# Patient Record
Sex: Female | Born: 1960
Health system: Southern US, Community
[De-identification: ages and names within clinical notes are randomized; demographics above are authoritative.]

## PROBLEM LIST (undated history)

## (undated) DIAGNOSIS — K219 Gastro-esophageal reflux disease without esophagitis: Secondary | ICD-10-CM

## (undated) DIAGNOSIS — W3400XA Accidental discharge from unspecified firearms or gun, initial encounter: Secondary | ICD-10-CM

## (undated) DIAGNOSIS — M797 Fibromyalgia: Secondary | ICD-10-CM

## (undated) DIAGNOSIS — F329 Major depressive disorder, single episode, unspecified: Secondary | ICD-10-CM

## (undated) DIAGNOSIS — I4892 Unspecified atrial flutter: Secondary | ICD-10-CM

## (undated) DIAGNOSIS — K2270 Barrett's esophagus without dysplasia: Secondary | ICD-10-CM

## (undated) DIAGNOSIS — IMO0001 Reserved for inherently not codable concepts without codable children: Secondary | ICD-10-CM

## (undated) DIAGNOSIS — I471 Supraventricular tachycardia, unspecified: Secondary | ICD-10-CM

## (undated) DIAGNOSIS — F32A Depression, unspecified: Secondary | ICD-10-CM

## (undated) DIAGNOSIS — I10 Essential (primary) hypertension: Secondary | ICD-10-CM

## (undated) DIAGNOSIS — E119 Type 2 diabetes mellitus without complications: Secondary | ICD-10-CM

## (undated) DIAGNOSIS — I5042 Chronic combined systolic (congestive) and diastolic (congestive) heart failure: Secondary | ICD-10-CM

## (undated) DIAGNOSIS — I2510 Atherosclerotic heart disease of native coronary artery without angina pectoris: Secondary | ICD-10-CM

## (undated) HISTORY — PX: NECK SURGERY: SHX720

## (undated) HISTORY — PX: OTHER SURGICAL HISTORY: SHX169

## (undated) HISTORY — DX: Supraventricular tachycardia: I47.1

## (undated) HISTORY — DX: Supraventricular tachycardia, unspecified: I47.10

---

## 2000-06-07 ENCOUNTER — Ambulatory Visit (HOSPITAL_COMMUNITY): Admission: RE | Admit: 2000-06-07 | Discharge: 2000-06-07 | Payer: Self-pay | Admitting: Internal Medicine

## 2000-06-10 ENCOUNTER — Encounter: Payer: Self-pay | Admitting: Internal Medicine

## 2000-06-10 ENCOUNTER — Ambulatory Visit (HOSPITAL_COMMUNITY): Admission: RE | Admit: 2000-06-10 | Discharge: 2000-06-10 | Payer: Self-pay | Admitting: Internal Medicine

## 2003-03-08 ENCOUNTER — Ambulatory Visit (HOSPITAL_COMMUNITY): Admission: RE | Admit: 2003-03-08 | Discharge: 2003-03-08 | Payer: Self-pay | Admitting: Family Medicine

## 2007-05-07 ENCOUNTER — Ambulatory Visit (HOSPITAL_COMMUNITY): Admission: RE | Admit: 2007-05-07 | Discharge: 2007-05-07 | Payer: Self-pay | Admitting: Family Medicine

## 2009-08-20 ENCOUNTER — Emergency Department (HOSPITAL_COMMUNITY): Admission: EM | Admit: 2009-08-20 | Discharge: 2009-08-20 | Payer: Self-pay | Admitting: Emergency Medicine

## 2010-05-14 LAB — COMPREHENSIVE METABOLIC PANEL
AST: 54 U/L — ABNORMAL HIGH (ref 0–37)
Albumin: 3.9 g/dL (ref 3.5–5.2)
Chloride: 99 mEq/L (ref 96–112)
Creatinine, Ser: 0.49 mg/dL (ref 0.4–1.2)
GFR calc Af Amer: >60 mL/min (ref >60)
Potassium: 3.3 mEq/L — ABNORMAL LOW (ref 3.5–5.1)
Total Bilirubin: 0.4 mg/dL (ref 0.3–1.2)

## 2010-05-14 LAB — CBC
Platelets: 287 10*3/uL (ref 150–400)
RBC: 4.86 MIL/uL (ref 3.87–5.11)
WBC: 9.8 10*3/uL (ref 4.0–10.5)

## 2010-05-14 LAB — URINALYSIS, ROUTINE W REFLEX MICROSCOPIC
Bilirubin Urine: NEGATIVE
Glucose, UA: >1000 mg/dL — AB
Hgb urine dipstick: NEGATIVE
Leukocytes, UA: NEGATIVE
Nitrite: NEGATIVE
Protein, ur: NEGATIVE mg/dL
Specific Gravity, Urine: >1.03 — ABNORMAL HIGH (ref 1.005–1.030)
Urobilinogen, UA: 0.2 mg/dL (ref 0.0–1.0)
pH: 5.5 (ref 5.0–8.0)

## 2010-05-14 LAB — DIFFERENTIAL
Basophils Absolute: 0.1 10*3/uL (ref 0.0–0.1)
Eosinophils Relative: 1 % (ref 0–5)
Lymphocytes Relative: 36 % (ref 12–46)
Monocytes Absolute: 0.5 10*3/uL (ref 0.1–1.0)
Monocytes Relative: 5 % (ref 3–12)

## 2010-05-14 LAB — URINE MICROSCOPIC-ADD ON

## 2013-05-31 ENCOUNTER — Encounter (HOSPITAL_COMMUNITY): Payer: Self-pay | Admitting: Emergency Medicine

## 2013-05-31 ENCOUNTER — Observation Stay (HOSPITAL_COMMUNITY)
Admission: EM | Admit: 2013-05-31 | Discharge: 2013-06-04 | Disposition: A | Discharge status: Home / Self Care | Payer: Federal, State, Local not specified - PPO | Attending: Cardiology | Admitting: Cardiology

## 2013-05-31 ENCOUNTER — Emergency Department (HOSPITAL_COMMUNITY): Payer: Federal, State, Local not specified - PPO

## 2013-05-31 DIAGNOSIS — I5021 Acute systolic (congestive) heart failure: Secondary | ICD-10-CM

## 2013-05-31 DIAGNOSIS — E119 Type 2 diabetes mellitus without complications: Secondary | ICD-10-CM | POA: Diagnosis present

## 2013-05-31 DIAGNOSIS — I10 Essential (primary) hypertension: Secondary | ICD-10-CM | POA: Insufficient documentation

## 2013-05-31 DIAGNOSIS — I5042 Chronic combined systolic (congestive) and diastolic (congestive) heart failure: Secondary | ICD-10-CM | POA: Diagnosis present

## 2013-05-31 DIAGNOSIS — I509 Heart failure, unspecified: Secondary | ICD-10-CM | POA: Insufficient documentation

## 2013-05-31 DIAGNOSIS — R0609 Other forms of dyspnea: Secondary | ICD-10-CM

## 2013-05-31 DIAGNOSIS — K2270 Barrett's esophagus without dysplasia: Secondary | ICD-10-CM | POA: Insufficient documentation

## 2013-05-31 DIAGNOSIS — IMO0002 Reserved for concepts with insufficient information to code with codable children: Secondary | ICD-10-CM

## 2013-05-31 DIAGNOSIS — Z7982 Long term (current) use of aspirin: Secondary | ICD-10-CM | POA: Insufficient documentation

## 2013-05-31 DIAGNOSIS — I059 Rheumatic mitral valve disease, unspecified: Secondary | ICD-10-CM

## 2013-05-31 DIAGNOSIS — K219 Gastro-esophageal reflux disease without esophagitis: Secondary | ICD-10-CM | POA: Insufficient documentation

## 2013-05-31 DIAGNOSIS — E0781 Sick-euthyroid syndrome: Secondary | ICD-10-CM | POA: Insufficient documentation

## 2013-05-31 DIAGNOSIS — R0600 Dyspnea, unspecified: Secondary | ICD-10-CM | POA: Insufficient documentation

## 2013-05-31 DIAGNOSIS — F3289 Other specified depressive episodes: Secondary | ICD-10-CM | POA: Insufficient documentation

## 2013-05-31 DIAGNOSIS — Z23 Encounter for immunization: Secondary | ICD-10-CM | POA: Insufficient documentation

## 2013-05-31 DIAGNOSIS — J811 Chronic pulmonary edema: Secondary | ICD-10-CM | POA: Insufficient documentation

## 2013-05-31 DIAGNOSIS — R000 Tachycardia, unspecified: Secondary | ICD-10-CM | POA: Insufficient documentation

## 2013-05-31 DIAGNOSIS — M797 Fibromyalgia: Secondary | ICD-10-CM

## 2013-05-31 DIAGNOSIS — E785 Hyperlipidemia, unspecified: Secondary | ICD-10-CM | POA: Insufficient documentation

## 2013-05-31 DIAGNOSIS — I2510 Atherosclerotic heart disease of native coronary artery without angina pectoris: Secondary | ICD-10-CM | POA: Insufficient documentation

## 2013-05-31 DIAGNOSIS — I5043 Acute on chronic combined systolic (congestive) and diastolic (congestive) heart failure: Principal | ICD-10-CM | POA: Insufficient documentation

## 2013-05-31 DIAGNOSIS — E039 Hypothyroidism, unspecified: Secondary | ICD-10-CM | POA: Insufficient documentation

## 2013-05-31 DIAGNOSIS — R0989 Other specified symptoms and signs involving the circulatory and respiratory systems: Secondary | ICD-10-CM

## 2013-05-31 DIAGNOSIS — R0789 Other chest pain: Secondary | ICD-10-CM | POA: Insufficient documentation

## 2013-05-31 DIAGNOSIS — E1165 Type 2 diabetes mellitus with hyperglycemia: Secondary | ICD-10-CM | POA: Diagnosis present

## 2013-05-31 DIAGNOSIS — IMO0001 Reserved for inherently not codable concepts without codable children: Secondary | ICD-10-CM | POA: Insufficient documentation

## 2013-05-31 DIAGNOSIS — Z6836 Body mass index (BMI) 36.0-36.9, adult: Secondary | ICD-10-CM | POA: Insufficient documentation

## 2013-05-31 DIAGNOSIS — F329 Major depressive disorder, single episode, unspecified: Secondary | ICD-10-CM | POA: Insufficient documentation

## 2013-05-31 HISTORY — DX: Major depressive disorder, single episode, unspecified: F32.9

## 2013-05-31 HISTORY — DX: Essential (primary) hypertension: I10

## 2013-05-31 HISTORY — DX: Gastro-esophageal reflux disease without esophagitis: K21.9

## 2013-05-31 HISTORY — DX: Reserved for inherently not codable concepts without codable children: IMO0001

## 2013-05-31 HISTORY — DX: Accidental discharge from unspecified firearms or gun, initial encounter: W34.00XA

## 2013-05-31 HISTORY — DX: Atherosclerotic heart disease of native coronary artery without angina pectoris: I25.10

## 2013-05-31 HISTORY — DX: Barrett's esophagus without dysplasia: K22.70

## 2013-05-31 HISTORY — DX: Fibromyalgia: M79.7

## 2013-05-31 HISTORY — DX: Type 2 diabetes mellitus without complications: E11.9

## 2013-05-31 HISTORY — DX: Chronic combined systolic (congestive) and diastolic (congestive) heart failure: I50.42

## 2013-05-31 HISTORY — DX: Depression, unspecified: F32.A

## 2013-05-31 LAB — CBC WITH DIFFERENTIAL/PLATELET
BASOS ABS: 0 10*3/uL (ref 0.0–0.1)
BASOS PCT: 0 % (ref 0–1)
Eosinophils Absolute: 0.1 10*3/uL (ref 0.0–0.7)
Eosinophils Relative: 1 % (ref 0–5)
HCT: 41.3 % (ref 36.0–46.0)
Hemoglobin: 14.1 g/dL (ref 12.0–15.0)
LYMPHS PCT: 44 % (ref 12–46)
Lymphs Abs: 4 10*3/uL (ref 0.7–4.0)
MCH: 28.7 pg (ref 26.0–34.0)
MCHC: 34.1 g/dL (ref 30.0–36.0)
MCV: 83.9 fL (ref 78.0–100.0)
Monocytes Absolute: 0.3 10*3/uL (ref 0.1–1.0)
Monocytes Relative: 3 % (ref 3–12)
NEUTROS ABS: 4.6 10*3/uL (ref 1.7–7.7)
NEUTROS PCT: 51 % (ref 43–77)
Platelets: 298 10*3/uL (ref 150–400)
RBC: 4.92 MIL/uL (ref 3.87–5.11)
RDW: 12.7 % (ref 11.5–15.5)
WBC: 9 10*3/uL (ref 4.0–10.5)

## 2013-05-31 LAB — BLOOD GAS, ARTERIAL
Acid-base deficit: 1 mmol/L (ref 0.0–2.0)
BICARBONATE: 23.1 meq/L (ref 20.0–24.0)
Drawn by: 22223
O2 Content: 2 L/min
O2 SAT: 96.5 %
PATIENT TEMPERATURE: 37
PO2 ART: 87.5 mmHg (ref 80.0–100.0)
TCO2: 20.5 mmol/L (ref 0–100)
pCO2 arterial: 37.8 mmHg (ref 35.0–45.0)
pH, Arterial: 7.402 (ref 7.350–7.450)

## 2013-05-31 LAB — BASIC METABOLIC PANEL
BUN: 15 mg/dL (ref 6–23)
CHLORIDE: 94 meq/L — AB (ref 96–112)
CO2: 25 mEq/L (ref 19–32)
Calcium: 10.1 mg/dL (ref 8.4–10.5)
Creatinine, Ser: 0.47 mg/dL — ABNORMAL LOW (ref 0.50–1.10)
GFR calc non Af Amer: >90 mL/min (ref >90)
Glucose, Bld: 288 mg/dL — ABNORMAL HIGH (ref 70–99)
POTASSIUM: 4.1 meq/L (ref 3.7–5.3)
SODIUM: 133 meq/L — AB (ref 137–147)

## 2013-05-31 LAB — GLUCOSE, CAPILLARY
Glucose-Capillary: 236 mg/dL — ABNORMAL HIGH (ref 70–99)
Glucose-Capillary: 310 mg/dL — ABNORMAL HIGH (ref 70–99)
Glucose-Capillary: 231 mg/dL — ABNORMAL HIGH (ref 70–99)
Glucose-Capillary: 281 mg/dL — ABNORMAL HIGH (ref 70–99)

## 2013-05-31 LAB — TROPONIN I
Troponin I: <0.3 ng/mL (ref <0.30)
Troponin I: <0.3 ng/mL (ref <0.30)

## 2013-05-31 LAB — HEMOGLOBIN A1C
Hgb A1c MFr Bld: 11.2 % — ABNORMAL HIGH (ref <5.7)
Mean Plasma Glucose: 275 mg/dL — ABNORMAL HIGH (ref <117)

## 2013-05-31 LAB — RAPID URINE DRUG SCREEN, HOSP PERFORMED
AMPHETAMINES: NOT DETECTED
BARBITURATES: NOT DETECTED
Benzodiazepines: NOT DETECTED
COCAINE: NOT DETECTED
Opiates: NOT DETECTED
Tetrahydrocannabinol: NOT DETECTED

## 2013-05-31 LAB — CBG MONITORING, ED: Glucose-Capillary: 234 mg/dL — ABNORMAL HIGH (ref 70–99)

## 2013-05-31 LAB — D-DIMER, QUANTITATIVE (NOT AT ARMC): D DIMER QUANT: 1 ug{FEU}/mL — AB (ref 0.00–0.48)

## 2013-05-31 LAB — I-STAT TROPONIN, ED: TROPONIN I, POC: 0.01 ng/mL (ref 0.00–0.08)

## 2013-05-31 LAB — TSH: TSH: 8.36 u[IU]/mL — AB (ref 0.350–4.500)

## 2013-05-31 LAB — PRO B NATRIURETIC PEPTIDE: PRO B NATRI PEPTIDE: 445.4 pg/mL — AB (ref 0–125)

## 2013-05-31 MED ORDER — ONDANSETRON HCL 4 MG/2ML IJ SOLN: 4.0000 mg | Freq: Four times a day (QID) | INTRAMUSCULAR | Status: DC | PRN

## 2013-05-31 MED ORDER — CARVEDILOL 3.125 MG PO TABS
3.1250 mg | ORAL_TABLET | Freq: Two times a day (BID) | ORAL | Status: DC
  Administered 2013-05-31 – 2013-06-02 (×4): 3.125 mg via ORAL
  Filled 2013-05-31 (×4): qty 1

## 2013-05-31 MED ORDER — ENOXAPARIN SODIUM 40 MG/0.4ML ~~LOC~~ SOLN
40.0000 mg | SUBCUTANEOUS | Status: DC
  Administered 2013-05-31 – 2013-06-03 (×4): 40 mg via SUBCUTANEOUS
  Filled 2013-05-31 (×4): qty 0.4

## 2013-05-31 MED ORDER — QUINAPRIL HCL 10 MG PO TABS
10.0000 mg | ORAL_TABLET | Freq: Every day | ORAL | Status: DC
  Filled 2013-05-31: qty 1

## 2013-05-31 MED ORDER — INSULIN ASPART 100 UNIT/ML ~~LOC~~ SOLN
0.0000 [IU] | Freq: Three times a day (TID) | SUBCUTANEOUS | Status: DC
  Administered 2013-05-31: 7 [IU] via SUBCUTANEOUS
  Administered 2013-06-01: 11 [IU] via SUBCUTANEOUS
  Administered 2013-06-01 (×2): 7 [IU] via SUBCUTANEOUS
  Administered 2013-06-02: 11 [IU] via SUBCUTANEOUS
  Administered 2013-06-02: 4 [IU] via SUBCUTANEOUS
  Administered 2013-06-02: 11 [IU] via SUBCUTANEOUS
  Administered 2013-06-03: 7 [IU] via SUBCUTANEOUS

## 2013-05-31 MED ORDER — INSULIN ASPART 100 UNIT/ML ~~LOC~~ SOLN: 0.0000 [IU] | Freq: Every day | SUBCUTANEOUS | Status: DC

## 2013-05-31 MED ORDER — ASPIRIN EC 325 MG PO TBEC
325.0000 mg | DELAYED_RELEASE_TABLET | Freq: Every day | ORAL | Status: DC
  Administered 2013-05-31 – 2013-06-03 (×4): 325 mg via ORAL
  Filled 2013-05-31 (×4): qty 1

## 2013-05-31 MED ORDER — LISINOPRIL 10 MG PO TABS
10.0000 mg | ORAL_TABLET | Freq: Every day | ORAL | Status: DC
  Administered 2013-05-31 – 2013-06-03 (×4): 10 mg via ORAL
  Filled 2013-05-31 (×5): qty 1

## 2013-05-31 MED ORDER — BIOTENE DRY MOUTH MT LIQD
15.0000 mL | Freq: Two times a day (BID) | OROMUCOSAL | Status: DC
  Administered 2013-05-31 – 2013-06-04 (×9): 15 mL via OROMUCOSAL

## 2013-05-31 MED ORDER — ALPRAZOLAM 0.5 MG PO TABS
0.5000 mg | ORAL_TABLET | Freq: Every evening | ORAL | Status: DC | PRN
  Administered 2013-06-01 – 2013-06-03 (×3): 0.5 mg via ORAL
  Filled 2013-05-31 (×3): qty 1

## 2013-05-31 MED ORDER — PANTOPRAZOLE SODIUM 40 MG PO TBEC
80.0000 mg | DELAYED_RELEASE_TABLET | Freq: Every day | ORAL | Status: DC
  Administered 2013-05-31: 80 mg via ORAL
  Filled 2013-05-31: qty 2

## 2013-05-31 MED ORDER — TRAMADOL HCL 50 MG PO TABS
50.0000 mg | ORAL_TABLET | Freq: Four times a day (QID) | ORAL | Status: DC | PRN
  Administered 2013-05-31 – 2013-06-04 (×10): 50 mg via ORAL
  Filled 2013-05-31 (×10): qty 1

## 2013-05-31 MED ORDER — ONDANSETRON HCL 4 MG PO TABS: 4.0000 mg | ORAL_TABLET | Freq: Four times a day (QID) | ORAL | Status: DC | PRN

## 2013-05-31 MED ORDER — ACETAMINOPHEN 650 MG RE SUPP: 650.0000 mg | Freq: Four times a day (QID) | RECTAL | Status: DC | PRN

## 2013-05-31 MED ORDER — NAPROXEN 250 MG PO TABS: 500.0000 mg | ORAL_TABLET | Freq: Two times a day (BID) | ORAL | Status: DC

## 2013-05-31 MED ORDER — FUROSEMIDE 10 MG/ML IJ SOLN
40.0000 mg | Freq: Once | INTRAMUSCULAR | Status: AC
  Administered 2013-05-31: 40 mg via INTRAVENOUS
  Filled 2013-05-31: qty 4

## 2013-05-31 MED ORDER — ALBUTEROL SULFATE (2.5 MG/3ML) 0.083% IN NEBU
2.5000 mg | INHALATION_SOLUTION | Freq: Once | RESPIRATORY_TRACT | Status: AC
  Administered 2013-05-31: 2.5 mg via RESPIRATORY_TRACT
  Filled 2013-05-31: qty 3

## 2013-05-31 MED ORDER — PNEUMOCOCCAL VAC POLYVALENT 25 MCG/0.5ML IJ INJ
0.5000 mL | INJECTION | INTRAMUSCULAR | Status: AC
  Administered 2013-06-01: 0.5 mL via INTRAMUSCULAR
  Filled 2013-05-31: qty 0.5

## 2013-05-31 MED ORDER — SODIUM CHLORIDE 0.9 % IJ SOLN
3.0000 mL | Freq: Two times a day (BID) | INTRAMUSCULAR | Status: DC
  Administered 2013-06-01 – 2013-06-03 (×4): 3 mL via INTRAVENOUS

## 2013-05-31 MED ORDER — PANTOPRAZOLE SODIUM 40 MG PO TBEC
40.0000 mg | DELAYED_RELEASE_TABLET | Freq: Every day | ORAL | Status: DC
  Administered 2013-06-01 – 2013-06-03 (×3): 40 mg via ORAL
  Filled 2013-05-31 (×3): qty 1

## 2013-05-31 MED ORDER — INSULIN ASPART 100 UNIT/ML ~~LOC~~ SOLN
0.0000 [IU] | Freq: Three times a day (TID) | SUBCUTANEOUS | Status: DC
  Administered 2013-05-31: 3 [IU] via SUBCUTANEOUS
  Administered 2013-05-31: 7 [IU] via SUBCUTANEOUS

## 2013-05-31 MED ORDER — IOHEXOL 350 MG/ML SOLN
100.0000 mL | Freq: Once | INTRAVENOUS | Status: AC | PRN
  Administered 2013-05-31: 100 mL via INTRAVENOUS

## 2013-05-31 MED ORDER — PAROXETINE HCL 20 MG PO TABS
40.0000 mg | ORAL_TABLET | Freq: Every day | ORAL | Status: DC
  Administered 2013-05-31 – 2013-06-04 (×5): 40 mg via ORAL
  Filled 2013-05-31 (×7): qty 2

## 2013-05-31 MED ORDER — FUROSEMIDE 20 MG PO TABS
20.0000 mg | ORAL_TABLET | Freq: Every day | ORAL | Status: DC
  Administered 2013-06-01 – 2013-06-04 (×4): 20 mg via ORAL
  Filled 2013-05-31 (×4): qty 1

## 2013-05-31 MED ORDER — IPRATROPIUM-ALBUTEROL 0.5-2.5 (3) MG/3ML IN SOLN
3.0000 mL | Freq: Once | RESPIRATORY_TRACT | Status: AC
  Administered 2013-05-31: 3 mL via RESPIRATORY_TRACT
  Filled 2013-05-31: qty 3

## 2013-05-31 MED ORDER — SODIUM CHLORIDE 0.9 % IJ SOLN: 3.0000 mL | INTRAMUSCULAR | Status: DC | PRN

## 2013-05-31 MED ORDER — SODIUM CHLORIDE 0.9 % IV SOLN: 250.0000 mL | INTRAVENOUS | Status: DC | PRN

## 2013-05-31 MED ORDER — ACETAMINOPHEN 325 MG PO TABS
650.0000 mg | ORAL_TABLET | Freq: Four times a day (QID) | ORAL | Status: DC | PRN
  Administered 2013-06-01 (×2): 650 mg via ORAL
  Filled 2013-05-31 (×2): qty 2

## 2013-05-31 MED ORDER — DOCUSATE SODIUM 100 MG PO CAPS
100.0000 mg | ORAL_CAPSULE | Freq: Every day | ORAL | Status: DC
  Administered 2013-05-31 – 2013-06-04 (×5): 100 mg via ORAL
  Filled 2013-05-31 (×7): qty 1

## 2013-05-31 MED ORDER — NITROGLYCERIN 0.4 MG SL SUBL
0.4000 mg | SUBLINGUAL_TABLET | SUBLINGUAL | Status: DC | PRN
  Administered 2013-06-03 (×2): 0.4 mg via SUBLINGUAL
  Filled 2013-05-31 (×2): qty 1

## 2013-05-31 MED ORDER — SODIUM CHLORIDE 0.9 % IJ SOLN
3.0000 mL | Freq: Two times a day (BID) | INTRAMUSCULAR | Status: DC
  Administered 2013-05-31 – 2013-06-03 (×6): 3 mL via INTRAVENOUS

## 2013-05-31 MED ORDER — INSULIN ASPART 100 UNIT/ML ~~LOC~~ SOLN
0.0000 [IU] | Freq: Every day | SUBCUTANEOUS | Status: DC
  Administered 2013-05-31: 3 [IU] via SUBCUTANEOUS
  Administered 2013-06-01: 4 [IU] via SUBCUTANEOUS
  Administered 2013-06-02: 3 [IU] via SUBCUTANEOUS

## 2013-05-31 NOTE — ED Notes (Signed)
Pt reports SOB that started a few hours ago. Pt states a heaviness to her chest. Pt has been coughing up small amounts of phlegm.
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
## 2013-05-31 NOTE — Progress Notes (Signed)
PROGRESS NOTE  Vanessa Silva LMB:[id number] DOB: 1960/09/26 DOA: 05/31/2013 PCP: Colette Ribas, MD  Summary: 53 year old woman with history of diabetes and hypertension presented with complaints of palpitations, chest pressure and shortness of breath on exertion. Initial evaluation revealed sinus tachycardia and oxygen saturation in the low 90s on room air. Extensive investigation including ABG, CTA of the chest, troponin, and EKG were unremarkable.  Assessment/Plan: 1. Acute systolic congestive heart failure. Echocardiogram reveals diffuse hypokinesis, new diagnosis. No hypoxia or tachypnea. 2. Atypical chest pressure. Describes reflux symptoms. Troponins negative. History, chronicity, echocardiogram findings suggest nonischemic cardiomyopathy. Continue serial cardiac enzymes. 3. Pulmonary edema. BNP minimally elevated. 4. Diabetes mellitus. Poorly controlled. Stop metformin indefinitely. Resume glyburide on discharge. 5. Hypertension. Labile. 6. Fibromyalgia. Stop chronic Naprosyn (taking "a lot" lately).    Start beta blocker, Lasix. Continue ACE inhibitor. Check lipid panel.  Cardiology consult 4/6.  Followup TSH.  Code Status: full code DVT prophylaxis: Lovenox Family Communication: none present Disposition Plan: home when improved; likely 1-2 days  Brendia Sacks, MD  [facility]  Pager [phone] If 7PM-7AM, please contact night-coverage at [email or web address], password [facility] 05/31/2013, 12:52 PM  LOS: 0 days   Consultants:  Cardiology 4/5  Procedures:  2-D echocardiogram. Left ventricular ejection fraction 30-35%. Diffuse hypokinesis.  HPI/Subjective: Breathing better today. Still has some wheezing. Complains of acid reflux. No chest pain.  Objective: Filed Vitals:   05/31/13 0419 05/31/13 0500 05/31/13 0600 05/31/13 0700  BP: 140/74 154/77 151/91 177/111  Pulse: 103 100 102 107  Temp: 97.6 F (36.4 C)   97.8 F (36.6 C)  TempSrc: Oral   Oral  Resp:  20 17 14    Height:    5\' 8"  (1.727 m)  Weight:    111.721 kg (246 lb 4.8 oz)  SpO2: 95% 94% 96% 95%   No intake or output data in the 24 hours ending 05/31/13 1252   Filed Weights   05/31/13 0124 05/31/13 0700  Weight: 97.523 kg (215 lb) 111.721 kg (246 lb 4.8 oz)    Exam:   Afebrile, vital signs stable. No hypoxia. Normal respiratory rate.  Gen. Appears comfortable. Tearful. Somewhat anxious. Nontoxic.  Respiratory clear to auscultation bilaterally. No wheezes, rales or rhonchi. Normal respiratory effort.  Cardiovascular regular rate and rhythm. No murmur, rub or gallop. No lower extremity edema.  Telemetry sinus tachycardia.  Respiratory clear to auscultation bilaterally. No wheezes, rales or rhonchi. Normal respiratory effort.  Abdomen soft nontender, nondistended. Obese.  Data Reviewed:  EKG shows sinus tachycardia, no acute changes.  Blood sugars 200-300s.  Troponins negative thus far  Urine drug screen negative.  Scheduled Meds: . antiseptic oral rinse  15 mL Mouth Rinse BID  . aspirin  325 mg Oral Daily  . docusate sodium  100 mg Oral Daily  . enoxaparin (LOVENOX) injection  40 mg Subcutaneous Q24H  . insulin aspart  0-5 Units Subcutaneous QHS  . insulin aspart  0-9 Units Subcutaneous TID WC  . lisinopril  10 mg Oral QHS  . pantoprazole  80 mg Oral Q1200  . PARoxetine  40 mg Oral Daily  . [START ON 06/01/2013] pneumococcal 23 valent vaccine  0.5 mL Intramuscular Tomorrow-1000  . sodium chloride  3 mL Intravenous Q12H   Continuous Infusions:   Principal Problem:   Acute systolic CHF (congestive heart failure) Active Problems:   Pulmonary edema   DM (diabetes mellitus), type 2, uncontrolled   Tachycardia   HTN (hypertension)   Chest discomfort   Fibromyalgia  Time spent 20 minutes

## 2013-05-31 NOTE — ED Notes (Signed)
Patient reports still having some chest pain and pressure and shortness of breath. States does not want to take nitroglycerin for pain at this time.

## 2013-05-31 NOTE — ED Notes (Signed)
Dr. Rito Ehrlich in assessing patient at this time.
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
## 2013-05-31 NOTE — ED Notes (Signed)
Patient ambulated around nursing station. Patient heart rate up to 117 and pulse oximetry to 90% on room air. Patient reported feeling somewhat unsteady towards end of ambulation.

## 2013-06-01 ENCOUNTER — Encounter (HOSPITAL_COMMUNITY): Payer: Self-pay | Admitting: Oncology

## 2013-06-01 DIAGNOSIS — I509 Heart failure, unspecified: Secondary | ICD-10-CM

## 2013-06-01 DIAGNOSIS — I5021 Acute systolic (congestive) heart failure: Secondary | ICD-10-CM

## 2013-06-01 LAB — LIPID PANEL
Cholesterol: 207 mg/dL — ABNORMAL HIGH (ref 0–200)
HDL: 51 mg/dL (ref >39)
LDL Cholesterol: 138 mg/dL — ABNORMAL HIGH (ref 0–99)
TRIGLYCERIDES: 88 mg/dL (ref <150)
Total CHOL/HDL Ratio: 4.1 RATIO
VLDL: 18 mg/dL (ref 0–40)

## 2013-06-01 LAB — GLUCOSE, CAPILLARY
GLUCOSE-CAPILLARY: 246 mg/dL — AB (ref 70–99)
GLUCOSE-CAPILLARY: 334 mg/dL — AB (ref 70–99)
Glucose-Capillary: 260 mg/dL — ABNORMAL HIGH (ref 70–99)
Glucose-Capillary: 225 mg/dL — ABNORMAL HIGH (ref 70–99)

## 2013-06-01 LAB — BASIC METABOLIC PANEL
BUN: 11 mg/dL (ref 6–23)
CHLORIDE: 98 meq/L (ref 96–112)
CO2: 28 meq/L (ref 19–32)
Calcium: 9.7 mg/dL (ref 8.4–10.5)
Creatinine, Ser: 0.41 mg/dL — ABNORMAL LOW (ref 0.50–1.10)
GFR calc Af Amer: >90 mL/min (ref >90)
Glucose, Bld: 257 mg/dL — ABNORMAL HIGH (ref 70–99)
Potassium: 3.9 mEq/L (ref 3.7–5.3)
SODIUM: 140 meq/L (ref 137–147)

## 2013-06-01 MED ORDER — FUROSEMIDE 10 MG/ML IJ SOLN
20.0000 mg | Freq: Once | INTRAMUSCULAR | Status: AC
  Administered 2013-06-01: 20 mg via INTRAVENOUS
  Filled 2013-06-01: qty 2

## 2013-06-01 MED ORDER — LORATADINE 10 MG PO TABS
10.0000 mg | ORAL_TABLET | Freq: Every day | ORAL | Status: DC
  Administered 2013-06-01 – 2013-06-04 (×4): 10 mg via ORAL
  Filled 2013-06-01 (×4): qty 1

## 2013-06-01 MED ORDER — INSULIN GLARGINE 100 UNIT/ML ~~LOC~~ SOLN
10.0000 [IU] | Freq: Every day | SUBCUTANEOUS | Status: DC
  Administered 2013-06-01: 10 [IU] via SUBCUTANEOUS
  Filled 2013-06-01: qty 0.1

## 2013-06-01 NOTE — Progress Notes (Signed)
I have given pt education materials on both diabetes and heart failure. Pt is very motivated to learn. We have talked about heart failure and the importance of monitoring daily weights. Pt also has inquired about using sliding scale insulins to tightly control sugars at home. Pt's husband uses insulin at home so she has some familiarity with it. At dinner I used sliding scale with pt at bedside and she was able to correctly determine amount of units needed to cover her dinnertime CBG and she was also able to appropriately demonstrate pulling up insulin into syringe as well as administering it to herself. Will continue to monitor.

## 2013-06-01 NOTE — Progress Notes (Signed)
Inpatient Diabetes Program Recommendations  AACE/ADA: New Consensus Statement on Inpatient Glycemic Control (2013)  Target Ranges:  Prepandial:   less than 140 mg/dL      Peak postprandial:   less than 180 mg/dL (1-2 hours)      Critically ill patients:  140 - 180 mg/dL   Results for Vanessa Silva, Vanessa Silva (MRN [id number]) as of 06/01/2013 07:38  Ref. Range 05/31/2013 08:20 05/31/2013 11:21 05/31/2013 16:08 05/31/2013 21:59 06/01/2013 07:12  Glucose-Capillary Latest Range: 70-99 mg/dL 106 (H) 269 (H) 485 (H) 281 (H) 246 (H)   Diabetes history: DM2 Outpatient Diabetes medications: Lantus 35 units QHS, Glyburide 5 mg BID, Metformin 1000 mg BID Current orders for Inpatient glycemic control: Novolog 0-20 units AC, Novolog 0-5 units HS  Inpatient Diabetes Program Recommendations Insulin - Basal: Please consider ordering Lantus 17 units daily (to start now).  HgbA1C: A1C 11.2% on 05/31/13.  Thanks, Orlando Penner, RN, MSN, CCRN Diabetes Coordinator Inpatient Diabetes Program [phone] (Team Pager) [phone] ([facility] office) [phone] [facility] office)

## 2013-06-01 NOTE — Care Management Note (Addendum)
    Page 1 of 1   06/03/2013     11:49:09 AM   CARE MANAGEMENT NOTE 06/03/2013  Patient:  ARSENIA, STOLZENBURG   Account Number:  [id number]  Date Initiated:  06/01/2013  Documentation initiated by:  Sharrie Rothman  Subjective/Objective Assessment:   Pt admitted from home with CHF. Pt lives with her husband and will return home at discharge. Pt is independent with ADl's. Pt helps care for her mother at night.     Action/Plan:   No CM needs noted.   Anticipated DC Date:  06/02/2013   Anticipated DC Plan:  HOME/SELF CARE      DC Planning Services  CM consult      Choice offered to / List presented to:             Status of service:  Completed, signed off Medicare Important Message given?   (If response is "NO", the following Medicare IM given date fields will be blank) Date Medicare IM given:   Date Additional Medicare IM given:    Discharge Disposition:  ACUTE TO ACUTE TRANS  Per UR Regulation:    If discussed at Long Length of Stay Meetings, dates discussed:    Comments:  06/03/13 1150 Arlyss Queen, RN BSN CM Pt to transfer to [facility] for heart cath. CM on receiving unit to follow for discharge planning needs.  06/01/13 1125 Arlyss Queen, RN BSN CM

## 2013-06-01 NOTE — Progress Notes (Addendum)
Explained to pt the need for educational videos on diabetes as her A1C is elevated. Pt is agreeable and has begun watching the videos. So far she has seen 501, 502, 503, 504

## 2013-06-01 NOTE — Progress Notes (Signed)
Nutrition Brief Note  Received consult for DM diet edu. Attempted to complete, however, pt was unavailable at time of visit. Will reattempt tomorrow.  Dennis Hegeman A. Mayford Knife, RD, LDN Pager: [phone]
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
## 2013-06-01 NOTE — Progress Notes (Signed)
PROGRESS NOTE  Vanessa Silva:[id number] DOB: Sep 18, 1960 DOA: 05/31/2013 PCP: Colette Ribas, MD  Summary: 53 year old woman with history of diabetes and hypertension presented with complaints of palpitations, chest pressure and shortness of breath on exertion. 2-D echocardiogram revealed new diagnosis of significant systolic congestive heart failure with diffuse hypokinesis. Cardiology following, ischemic evaluation being considered.  Assessment/Plan: 1. Acute systolic congestive heart failure. Clinically improved. Echocardiogram reveals diffuse hypokinesis, new diagnosis. Consider nonischemic. 2. Atypical chest pressure. Resolved. Describes reflux symptoms. Troponins negative.  3. Diabetes mellitus type II, uncontrolled. Hemoglobin A1c 11.2. Stop metformin indefinitely. Consider insulin on discharge. 4. High TSH. Follow T3, T4. 5. Hypertension. Well controlled. 6. Fibromyalgia. Stop chronic Naprosyn (taking "a lot" lately).    Overall improvement. Continue Coreg, Lasix, lisinopril.  Cardiology consult appreciated. Ischemic evaluation being considered.  Start Lantus  Followup T3, T4.  Code Status: full code DVT prophylaxis: Lovenox Family Communication: none present Disposition Plan: home when improved; likely 1-2 days  Brendia Sacks, MD  [facility]  Pager [phone] If 7PM-7AM, please contact night-coverage at [email or web address], password [facility] 06/01/2013, 5:58 PM  LOS: 1 day   Consultants:  Cardiology 4/5  Procedures:  2-D echocardiogram. Left ventricular ejection fraction 30-35%. Diffuse hypokinesis.  HPI/Subjective: Feels much better today. Chest tightness has resolved. Breathing better.  Objective: Filed Vitals:   05/31/13 2200 06/01/13 0447 06/01/13 1555 06/01/13 1751  BP: 156/85 120/75 129/78   Pulse: 97 88 88 90  Temp: 98 F (36.7 C) 97.6 F (36.4 C) 98.1 F (36.7 C)   TempSrc: Oral Oral Oral   Resp: 18 18 18    Height:      Weight:        SpO2: 95% 98% 96%     Intake/Output Summary (Last 24 hours) at 06/01/13 1758 Last data filed at 06/01/13 1200  Gross per 24 hour  Intake    480 ml  Output    800 ml  Net   -320 ml     Filed Weights   05/31/13 0124 05/31/13 0700  Weight: 97.523 kg (215 lb) 111.721 kg (246 lb 4.8 oz)    Exam:   Afebrile, vital signs stable. No hypoxia. Normal respiratory rate.  Gen. Appears calm and comfortable.  Respiratory clear to auscultation bilaterally. No wheezes, rales rhonchi. Normal respiratory effort.  Cardiovascular regular rate and rhythm. No murmur, rub or gallop. No lower extremity edema.  Data Reviewed:  Troponins negative.  Capillary blood sugars 200s.  LDL 138  Scheduled Meds: . antiseptic oral rinse  15 mL Mouth Rinse BID  . aspirin  325 mg Oral Daily  . carvedilol  3.125 mg Oral BID WC  . docusate sodium  100 mg Oral Daily  . enoxaparin (LOVENOX) injection  40 mg Subcutaneous Q24H  . furosemide  20 mg Oral Daily  . insulin aspart  0-20 Units Subcutaneous TID WC  . insulin aspart  0-5 Units Subcutaneous QHS  . lisinopril  10 mg Oral QHS  . loratadine  10 mg Oral Daily  . pantoprazole  40 mg Oral Q1200  . PARoxetine  40 mg Oral Daily  . sodium chloride  3 mL Intravenous Q12H  . sodium chloride  3 mL Intravenous Q12H   Continuous Infusions:   Principal Problem:   Acute systolic CHF (congestive heart failure) Active Problems:   Pulmonary edema   DM (diabetes mellitus), type 2, uncontrolled   Tachycardia   HTN (hypertension)   Chest discomfort   Fibromyalgia   Time spent 20  minutes

## 2013-06-02 DIAGNOSIS — E1165 Type 2 diabetes mellitus with hyperglycemia: Secondary | ICD-10-CM

## 2013-06-02 DIAGNOSIS — I10 Essential (primary) hypertension: Secondary | ICD-10-CM

## 2013-06-02 DIAGNOSIS — E0781 Sick-euthyroid syndrome: Secondary | ICD-10-CM

## 2013-06-02 DIAGNOSIS — IMO0001 Reserved for inherently not codable concepts without codable children: Secondary | ICD-10-CM

## 2013-06-02 LAB — GLUCOSE, CAPILLARY
GLUCOSE-CAPILLARY: 273 mg/dL — AB (ref 70–99)
Glucose-Capillary: 293 mg/dL — ABNORMAL HIGH (ref 70–99)
Glucose-Capillary: 154 mg/dL — ABNORMAL HIGH (ref 70–99)
Glucose-Capillary: 274 mg/dL — ABNORMAL HIGH (ref 70–99)

## 2013-06-02 LAB — T3: T3 TOTAL: 112.6 ng/dL (ref 80.0–204.0)

## 2013-06-02 LAB — T4, FREE: Free T4: 0.98 ng/dL (ref 0.80–1.80)

## 2013-06-02 MED ORDER — CARVEDILOL 6.25 MG PO TABS
6.2500 mg | ORAL_TABLET | Freq: Two times a day (BID) | ORAL | Status: DC
  Administered 2013-06-02 – 2013-06-04 (×3): 6.25 mg via ORAL
  Filled 2013-06-02: qty 2
  Filled 2013-06-02 (×3): qty 1
  Filled 2013-06-02: qty 2

## 2013-06-02 MED ORDER — INSULIN NPH (HUMAN) (ISOPHANE) 100 UNIT/ML ~~LOC~~ SUSP
15.0000 [IU] | Freq: Once | SUBCUTANEOUS | Status: AC
  Administered 2013-06-02: 15 [IU] via SUBCUTANEOUS
  Filled 2013-06-02 (×2): qty 10

## 2013-06-02 MED ORDER — ATORVASTATIN CALCIUM 20 MG PO TABS
20.0000 mg | ORAL_TABLET | Freq: Every day | ORAL | Status: DC
  Administered 2013-06-02: 20 mg via ORAL
  Filled 2013-06-02 (×2): qty 1

## 2013-06-02 MED ORDER — LIVING BETTER WITH HEART FAILURE BOOK
Freq: Once | Status: AC
  Administered 2013-06-02: 1
  Filled 2013-06-02: qty 1

## 2013-06-02 MED ORDER — INSULIN ASPART 100 UNIT/ML ~~LOC~~ SOLN
4.0000 [IU] | Freq: Three times a day (TID) | SUBCUTANEOUS | Status: DC
  Administered 2013-06-02 – 2013-06-03 (×4): 4 [IU] via SUBCUTANEOUS

## 2013-06-02 MED ORDER — INSULIN GLARGINE 100 UNIT/ML ~~LOC~~ SOLN
25.0000 [IU] | Freq: Every day | SUBCUTANEOUS | Status: DC
  Administered 2013-06-02: 25 [IU] via SUBCUTANEOUS
  Filled 2013-06-02: qty 0.25

## 2013-06-02 MED ORDER — INSULIN GLARGINE 100 UNIT/ML ~~LOC~~ SOLN: 15.0000 [IU] | Freq: Every day | SUBCUTANEOUS | Status: DC

## 2013-06-02 NOTE — Progress Notes (Signed)
[facility] PROGRESS NOTE  Myer HaffDawn Y Printz ZOX:[id number] DOB: 11/14/1960 DOA: 05/31/2013 PCP: Colette RibasGOLDING, JOHN CABOT, MD  Assessment/Plan  1. Acute systolic congestive heart failure. Clinically improved. Echocardiogram reveals diffuse hypokinesis, new diagnosis.  -  Appreciate cardiology assistance -  Plan for outpatient ischemia work up -  Advised to stop NSAIDS -  On ACEI/BB/lasix -  Consider addition of spironolactone as outpatient -  Heart failure education 2. Atypical chest pressure. Resolved. Describes reflux symptoms. Troponins negative.  Start PPI Diabetes mellitus type II, uncontrolled. Hemoglobin A1c 11.2. CBG is markedly elevated.  -  Stop metformin indefinitely.  -  Increase lantus to 25 units and increase aspart with meals -  Continue high dose SSI  -  Continue diabetes videos 3. Sick euthyroid with moderately elevated TSH.  T3/T4 wnl -  Repeat TFTs in 4 weeks by PCP 4. Hypertension. Well controlled. 5. Fibromyalgia. Stop chronic Naprosyn (taking "a lot" lately).   Diet:  Diabetic diet Access:  PIV IVF:  off Proph:  lovenox  Code Status: full Family Communication: patient alone Disposition Plan: pending improvement in CBGs, ambulating   Consultants:  Diabetic educator  Cardiology  Nutrition  Procedures:  ECHO  Antibiotics:  none   HPI/Subjective:  Patient states that her breathing is much easier now. She feels that her belly has shrunk with diuresis. Denies chest pains.  Objective: Filed Vitals:   06/01/13 1751 06/01/13 2209 06/02/13 0526 06/02/13 0718  BP:  141/82 119/72   Pulse: 90 94 87   Temp:  98 F (36.7 C) 97.5 F (36.4 C)   TempSrc:  Oral Oral   Resp:  20 20   Height:      Weight:      SpO2:   91% 91%    Intake/Output Summary (Last 24 hours) at 06/02/13 1205 Last data filed at 06/02/13 1052  Gross per 24 hour  Intake    480 ml  Output      0 ml  Net    480 ml   Filed Weights   05/31/13 0124 05/31/13 0700  Weight:  97.523 kg (215 lb) 111.721 kg (246 lb 4.8 oz)    Exam:   General:  Obese Caucasian female, No acute distress  HEENT:  NCAT, MMM  Cardiovascular:  RRR, nl S1, S2 no mrg, 2+ pulses, warm extremities  Respiratory:  CTAB, no increased WOB  Abdomen:   NABS, soft, NT/ND  MSK:   Normal tone and bulk, trace LEE  Neuro:  Grossly intact  Data Reviewed: Basic Metabolic Panel:  Recent Labs Lab 05/31/13 0123 06/01/13 0503  NA 133* 140  K 4.1 3.9  CL 94* 98  CO2 25 28  GLUCOSE 288* 257*  BUN 15 11  CREATININE 0.47* 0.41*  CALCIUM 10.1 9.7   Liver Function Tests: No results found for this basename: AST, ALT, ALKPHOS, BILITOT, PROT, ALBUMIN,  in the last 168 hours No results found for this basename: LIPASE, AMYLASE,  in the last 168 hours No results found for this basename: AMMONIA,  in the last 168 hours CBC:  Recent Labs Lab 05/31/13 0123  WBC 9.0  NEUTROABS 4.6  HGB 14.1  HCT 41.3  MCV 83.9  PLT 298   Cardiac Enzymes:  Recent Labs Lab 05/31/13 0712 05/31/13 1313 05/31/13 1910  TROPONINI <0.30 <0.30 <0.30   BNP (last 3 results)  Recent Labs  05/31/13 0123  PROBNP 445.4*   CBG:  Recent Labs Lab 06/01/13 1149 06/01/13 1631 06/01/13 2219 06/02/13 0735  06/02/13 1118  GLUCAP 260* 225* 334* 273* 293*    No results found for this or any previous visit (from the past 240 hour(s)).   Studies: No results found.  Scheduled Meds: . antiseptic oral rinse  15 mL Mouth Rinse BID  . aspirin  325 mg Oral Daily  . atorvastatin  20 mg Oral q1800  . carvedilol  6.25 mg Oral BID WC  . docusate sodium  100 mg Oral Daily  . enoxaparin (LOVENOX) injection  40 mg Subcutaneous Q24H  . furosemide  20 mg Oral Daily  . insulin aspart  0-20 Units Subcutaneous TID WC  . insulin aspart  0-5 Units Subcutaneous QHS  . insulin aspart  4 Units Subcutaneous TID WC  . insulin glargine  25 Units Subcutaneous QHS  . insulin NPH Human  15 Units Subcutaneous Once  .  lisinopril  10 mg Oral QHS  . loratadine  10 mg Oral Daily  . pantoprazole  40 mg Oral Q1200  . PARoxetine  40 mg Oral Daily  . sodium chloride  3 mL Intravenous Q12H  . sodium chloride  3 mL Intravenous Q12H   Continuous Infusions:   Principal Problem:   Acute systolic CHF (congestive heart failure) Active Problems:   Pulmonary edema   DM (diabetes mellitus), type 2, uncontrolled   Tachycardia   HTN (hypertension)   Chest discomfort   Fibromyalgia    Time spent: 30 min    Euclid Cassetta, [facility]  [facility] Pager [phone]. If 7PM-7AM, please contact night-coverage at [email or web address], password [facility] 06/02/2013, 12:05 PM  LOS: 2 days

## 2013-06-02 NOTE — Progress Notes (Addendum)
Consulting cardiologist: Dr. Dina Rich   Subjective:    Breathing comfortably, no chest pain or palpitations.  Objective:   Temp:  [97.5 F (36.4 C)-98.1 F (36.7 C)] 97.5 F (36.4 C) (04/07 0526) Pulse Rate:  [87-94] 87 (04/07 0526) Resp:  [18-20] 20 (04/07 0526) BP: (119-141)/(72-82) 119/72 mmHg (04/07 0526) SpO2:  [91 %-96 %] 91 % (04/07 0718) Last BM Date: 06/01/13  Filed Weights   05/31/13 0124 05/31/13 0700  Weight: 215 lb (97.523 kg) 246 lb 4.8 oz (111.721 kg)    Intake/Output Summary (Last 24 hours) at 06/02/13 1118 Last data filed at 06/02/13 1052  Gross per 24 hour  Intake    720 ml  Output      0 ml  Net    720 ml    Telemetry: Sinus rhythm.  Exam:  General: No distress.  Lungs: Clear, nonlabored.  Cardiac: RRR, no gallop.  Abdomen: Protuberant.  Extremities: Trace edema.   Lab Results:  Basic Metabolic Panel:  Recent Labs Lab 05/31/13 0123 06/01/13 0503  NA 133* 140  K 4.1 3.9  CL 94* 98  CO2 25 28  GLUCOSE 288* 257*  BUN 15 11  CREATININE 0.47* 0.41*  CALCIUM 10.1 9.7    CBC:  Recent Labs Lab 05/31/13 0123  WBC 9.0  HGB 14.1  HCT 41.3  MCV 83.9  PLT 298    Cardiac Enzymes:  Recent Labs Lab 05/31/13 0712 05/31/13 1313 05/31/13 1910  TROPONINI <0.30 <0.30 <0.30    BNP:  Recent Labs  05/31/13 0123  PROBNP 445.4*    Echocardiogram (05/31/2013): Study Conclusions  - Left ventricle: The cavity size was mildly dilated. Wall thickness was increased in a pattern of moderate LVH. Systolic function was moderately to severely reduced. The estimated ejection fraction was 35%, in the range of 30% to 35%. Diffuse hypokinesis. Doppler parameters are consistent with restrictive physiology, indicative of decreased left ventricular diastolic compliance and/or increased left atrial pressure. - Mitral valve: Calcified annulus. Mild regurgitation. - Left atrium: The atrium was moderately  dilated.    Medications:   Scheduled Medications: . antiseptic oral rinse  15 mL Mouth Rinse BID  . aspirin  325 mg Oral Daily  . carvedilol  3.125 mg Oral BID WC  . docusate sodium  100 mg Oral Daily  . enoxaparin (LOVENOX) injection  40 mg Subcutaneous Q24H  . furosemide  20 mg Oral Daily  . insulin aspart  0-20 Units Subcutaneous TID WC  . insulin aspart  0-5 Units Subcutaneous QHS  . insulin aspart  4 Units Subcutaneous TID WC  . insulin glargine  25 Units Subcutaneous QHS  . insulin NPH Human  15 Units Subcutaneous Once  . lisinopril  10 mg Oral QHS  . loratadine  10 mg Oral Daily  . pantoprazole  40 mg Oral Q1200  . PARoxetine  40 mg Oral Daily  . sodium chloride  3 mL Intravenous Q12H  . sodium chloride  3 mL Intravenous Q12H      PRN Medications:  sodium chloride, acetaminophen, acetaminophen, ALPRAZolam, nitroGLYCERIN, ondansetron (ZOFRAN) IV, ondansetron, sodium chloride, traMADol   Assessment:   1. Acute combined heart failure, LVEF 30-35% with diffuse hypokinesis. No clear evidence of ACS by cardiac enzymes. Could be nonischemic cardiomyopathy, but does have hypertension, poorly controlled type 2 diabetes mellitus, and family history of premature CAD.  2. Hypothyroidism. TSH 8.3, free T4 and T3 are normal.   3. Hypertension.  4. Type 2 diabetes mellitus,  poorly controlled with hemoglobin A1c 11.2.  5. Total cholesterol 207, LDL 138.   Plan/Discussion:    Current regimen includes aspirin, Coreg, Lasix, and lisinopril. Volume status improved, will increase Coreg dose to 6.25 mg BID. Would also suggest statin with LDL 138 in the face of diabetes mellitus. Would ambulate, possibly discharge within the next 24 hours. She will need outpatient followup with Dr. Wyline MoodBranch to discuss ischemic testing which can likely be accomplished as an outpatient.   Jonelle SidleSamuel G. McDowell, M.D., F.A.C.C.

## 2013-06-02 NOTE — Plan of Care (Signed)
Problem: Food- and Nutrition-Related Knowledge Deficit (NB-1.1) Goal: Nutrition education Formal process to instruct or train a patient/client in a skill or to impart knowledge to help patients/clients voluntarily manage or modify food choices and eating behavior to maintain or improve health. Outcome: Adequate for Discharge  RD consulted for nutrition education regarding diabetes.     Lab Results  Component Value Date    HGBA1C 11.2* 05/31/2013    RD provided "Carbohydrate Counting for People with Diabetes" handout from the Academy of [employer] and Dietetics. Discussed different food groups and their effects on blood sugar, emphasizing carbohydrate-containing foods. Provided list of carbohydrates and recommended serving sizes of common foods.  Discussed importance of controlled and consistent carbohydrate intake throughout the day. Provided examples of ways to balance meals/snacks and encouraged intake of high-fiber, whole grain complex carbohydrates. Teach back method used.  Expect fair compliance.  Body mass index is 37.46 kg/(m^2). Pt meets criteria for obesity, class II based on current BMI.  Current diet order is carb modified/ heart healthy, patient is consuming approximately 75-100% of meals at this time. Labs and medications reviewed. No further nutrition interventions warranted at this time. RD contact information provided. If additional nutrition issues arise, please re-consult RD.  Vanessa Silva, RD, LDN Pager: [phone]

## 2013-06-02 NOTE — Progress Notes (Signed)
CHF and DM education done with patient and husband. CHF video 102, DM videos [phone] watched by patient and husband.

## 2013-06-02 NOTE — Progress Notes (Signed)
Spoke with patient about diabetes and home regimen for diabetes control.  Patient states that she has an endocrinologist at [place] (Dr. Hollace Hayward) but she is interested in seeking a new endocrinologist that is closer to [facility]. Informed patient about Dr. Fransico Him (Endocrinologist in [facility]) and provided her with Dr. Isidoro Donning contact information. Patient states that she takes Lantus 30-40 units QHS (most of the time just takes a dose without knowing what her blood sugar is), Metformin 1000 mg BID, and Glyburide 5 mg BID as an outpatient. Discussed A1C results (11.2% on 05/31/13) and explained what an A1C is, basic pathophysiology of DM Type 2, basic home care, importance of checking CBGs and maintaining good CBG control to prevent long-term and short-term complications. Patient states that her last A1C was in the 11% range but she is unsure of the exact number. Patient admits that she does not check her blood sugar like she should, most days she does not even check once a day. When she does check her blood glucose at home it is always "greater than 200 mg/dl".  Explained importance of checking blood sugar 3-4 times a day to improve diabetes control and to provide doctors with the needed information to make adjustments in diabetes medications.  Requested patient check blood glucose 4 times a day (before meals and at bedtime). Patient states that she knows she needs to do better and that she is going to try to check her blood sugar as requested. Discussed impact of nutrition, exercise, stress, and sickness on diabetes control.  Patient states that she does not really follow a diabetic diet. Discussed Carb Modified Diabetic diet and provided patient with information on free outpatient diabetes education class at [facility]. Patient states that she will plan to attend one of the classes. Patient states that she has already watched several videos on diabetes education and diet.  Discussed  Lantus and Novolog insulins and explained onset, peak, and duration of insulins. Patient states that the bedside nurses have shown her how to use the "sliding scale with Novolog".  Patient states that she feels comfortable with using the sliding scale.  Asked that she ask nurses if she can draw up insulin based on the Novolog correction scale being used as an inpatient.  RNs to provide ongoing basic DM education at bedside with this patient and engage patient to actively check blood glucose and administer insulin injections. Patient verbalized understanding of information discussed and states that she does not have any further questions related to diabetes at this time.  MD: Please consider placing referral to Dr. Fransico Him. Patient has requested a 90 day supply of insulin and testing supplies at time of discharge (patient states her copays are better if she orders a 90 day supply versus a 30 day supply). Informed patient I would let MD know but I was not sure if Hospitalist would be able to do that since our intention is to have the patient follow up with an endocrinologist who can hopefully provide her with 90 day prescriptions.   Thanks, Orlando Penner, RN, MSN, CCRN Diabetes Coordinator Inpatient Diabetes Program [phone] (Team Pager) [phone] ([place] office) [phone] [facility] office)

## 2013-06-03 ENCOUNTER — Encounter (HOSPITAL_COMMUNITY)
Admission: EM | Disposition: A | Discharge status: Home / Self Care | Payer: Self-pay | Source: Home / Self Care | Attending: Family Medicine

## 2013-06-03 DIAGNOSIS — I2510 Atherosclerotic heart disease of native coronary artery without angina pectoris: Secondary | ICD-10-CM

## 2013-06-03 DIAGNOSIS — I509 Heart failure, unspecified: Secondary | ICD-10-CM

## 2013-06-03 DIAGNOSIS — R0789 Other chest pain: Secondary | ICD-10-CM

## 2013-06-03 DIAGNOSIS — I5021 Acute systolic (congestive) heart failure: Secondary | ICD-10-CM | POA: Insufficient documentation

## 2013-06-03 HISTORY — PX: LEFT AND RIGHT HEART CATHETERIZATION WITH CORONARY ANGIOGRAM: SHX5449

## 2013-06-03 LAB — POCT I-STAT 3, ART BLOOD GAS (G3+)
ACID-BASE DEFICIT: 1 mmol/L (ref 0.0–2.0)
Bicarbonate: 26.3 mEq/L — ABNORMAL HIGH (ref 20.0–24.0)
O2 Saturation: 93 %
TCO2: 28 mmol/L (ref 0–100)
pCO2 arterial: 53.9 mmHg — ABNORMAL HIGH (ref 35.0–45.0)
pH, Arterial: 7.296 — ABNORMAL LOW (ref 7.350–7.450)
pO2, Arterial: 75 mmHg — ABNORMAL LOW (ref 80.0–100.0)

## 2013-06-03 LAB — CBC
HCT: 42.1 % (ref 36.0–46.0)
Hemoglobin: 14.5 g/dL (ref 12.0–15.0)
MCH: 29 pg (ref 26.0–34.0)
MCHC: 34.4 g/dL (ref 30.0–36.0)
MCV: 84.2 fL (ref 78.0–100.0)
Platelets: 283 10*3/uL (ref 150–400)
RBC: 5 MIL/uL (ref 3.87–5.11)
RDW: 12.8 % (ref 11.5–15.5)
WBC: 9.4 10*3/uL (ref 4.0–10.5)

## 2013-06-03 LAB — BASIC METABOLIC PANEL
BUN: 11 mg/dL (ref 6–23)
CO2: 28 mEq/L (ref 19–32)
Calcium: 9.8 mg/dL (ref 8.4–10.5)
Chloride: 101 mEq/L (ref 96–112)
Creatinine, Ser: 0.39 mg/dL — ABNORMAL LOW (ref 0.50–1.10)
GFR calc Af Amer: >90 mL/min (ref >90)
Glucose, Bld: 246 mg/dL — ABNORMAL HIGH (ref 70–99)
POTASSIUM: 4.1 meq/L (ref 3.7–5.3)
SODIUM: 140 meq/L (ref 137–147)

## 2013-06-03 LAB — CREATININE, SERUM
Creatinine, Ser: 0.42 mg/dL — ABNORMAL LOW (ref 0.50–1.10)
GFR calc Af Amer: >90 mL/min (ref >90)

## 2013-06-03 LAB — POCT I-STAT 3, VENOUS BLOOD GAS (G3P V)
ACID-BASE DEFICIT: 3 mmol/L — AB (ref 0.0–2.0)
Bicarbonate: 24.5 mEq/L — ABNORMAL HIGH (ref 20.0–24.0)
O2 Saturation: 68 %
PH VEN: 7.295 (ref 7.250–7.300)
PO2 VEN: 40 mmHg (ref 30.0–45.0)
TCO2: 26 mmol/L (ref 0–100)
pCO2, Ven: 50.3 mmHg — ABNORMAL HIGH (ref 45.0–50.0)

## 2013-06-03 LAB — GLUCOSE, CAPILLARY
Glucose-Capillary: 249 mg/dL — ABNORMAL HIGH (ref 70–99)
Glucose-Capillary: 153 mg/dL — ABNORMAL HIGH (ref 70–99)

## 2013-06-03 LAB — POCT ACTIVATED CLOTTING TIME: ACTIVATED CLOTTING TIME: 155 s

## 2013-06-03 SURGERY — LEFT AND RIGHT HEART CATHETERIZATION WITH CORONARY ANGIOGRAM
Anesthesia: Moderate Sedation | Laterality: Bilateral

## 2013-06-03 MED ORDER — FENTANYL CITRATE 0.05 MG/ML IJ SOLN
INTRAMUSCULAR | Status: AC
  Filled 2013-06-03: qty 2

## 2013-06-03 MED ORDER — VERAPAMIL HCL 2.5 MG/ML IV SOLN
INTRAVENOUS | Status: AC
  Filled 2013-06-03: qty 2

## 2013-06-03 MED ORDER — SODIUM CHLORIDE 0.9 % IV SOLN: INTRAVENOUS | Status: AC

## 2013-06-03 MED ORDER — INSULIN GLARGINE 100 UNIT/ML ~~LOC~~ SOLN
40.0000 [IU] | Freq: Every day | SUBCUTANEOUS | Status: DC
  Administered 2013-06-03: 40 [IU] via SUBCUTANEOUS
  Filled 2013-06-03 (×3): qty 0.4

## 2013-06-03 MED ORDER — NITROGLYCERIN 0.2 MG/ML ON CALL CATH LAB
INTRAVENOUS | Status: AC
  Filled 2013-06-03: qty 1

## 2013-06-03 MED ORDER — INSULIN NPH (HUMAN) (ISOPHANE) 100 UNIT/ML ~~LOC~~ SUSP: 15.0000 [IU] | Freq: Once | SUBCUTANEOUS | Status: DC

## 2013-06-03 MED ORDER — ENOXAPARIN SODIUM 40 MG/0.4ML ~~LOC~~ SOLN
40.0000 mg | SUBCUTANEOUS | Status: DC
  Administered 2013-06-04: 40 mg via SUBCUTANEOUS
  Filled 2013-06-03 (×2): qty 0.4

## 2013-06-03 MED ORDER — MIDAZOLAM HCL 2 MG/2ML IJ SOLN
INTRAMUSCULAR | Status: AC
  Filled 2013-06-03: qty 2

## 2013-06-03 MED ORDER — INSULIN ASPART 100 UNIT/ML ~~LOC~~ SOLN
0.0000 [IU] | Freq: Three times a day (TID) | SUBCUTANEOUS | Status: DC
  Administered 2013-06-03: 5 [IU] via SUBCUTANEOUS
  Administered 2013-06-04: 3 [IU] via SUBCUTANEOUS
  Administered 2013-06-04: 2 [IU] via SUBCUTANEOUS

## 2013-06-03 MED ORDER — HEPARIN (PORCINE) IN NACL 2-0.9 UNIT/ML-% IJ SOLN
INTRAMUSCULAR | Status: AC
  Filled 2013-06-03: qty 1000

## 2013-06-03 MED ORDER — LIDOCAINE HCL (PF) 1 % IJ SOLN
INTRAMUSCULAR | Status: AC
  Filled 2013-06-03: qty 30

## 2013-06-03 MED ORDER — INSULIN ASPART 100 UNIT/ML ~~LOC~~ SOLN
10.0000 [IU] | Freq: Three times a day (TID) | SUBCUTANEOUS | Status: DC
  Administered 2013-06-04 (×2): 10 [IU] via SUBCUTANEOUS

## 2013-06-03 MED ORDER — HEPARIN (PORCINE) IN NACL 100-0.45 UNIT/ML-% IJ SOLN: 1000.0000 [IU]/h | INTRAMUSCULAR | Status: DC

## 2013-06-03 MED ORDER — ASPIRIN EC 81 MG PO TBEC
81.0000 mg | DELAYED_RELEASE_TABLET | Freq: Every day | ORAL | Status: DC
  Administered 2013-06-04: 81 mg via ORAL
  Filled 2013-06-03: qty 1

## 2013-06-03 MED ORDER — HEPARIN BOLUS VIA INFUSION
2000.0000 [IU] | Freq: Once | INTRAVENOUS | Status: DC
  Filled 2013-06-03: qty 2000

## 2013-06-03 NOTE — Interval H&P Note (Signed)
Cath Lab Visit (complete for each Cath Lab visit)  Clinical Evaluation Leading to the Procedure:   ACS: no  Non-ACS:    Anginal Classification: CCS IV  Anti-ischemic medical therapy: Minimal Therapy (1 class of medications)  Non-Invasive Test Results: No non-invasive testing performed  Prior CABG: No previous CABG      History and Physical Interval Note:  06/03/2013 3:38 PM Clinical data reviewed. Procedure and risk of stroke, death, MI, surgery, bleeding, kidney injury etc, discussed in detail. Vanessa Silva  has presented today for surgery, with the diagnosis of Heart Failure   The various methods of treatment have been discussed with the patient and family. After consideration of risks, benefits and other options for treatment, the patient has consented to  Procedure(s): LEFT AND RIGHT HEART CATHETERIZATION WITH CORONARY ANGIOGRAM (Bilateral) as a surgical intervention .  The patient's history has been reviewed, patient examined, no change in status, stable for surgery.  I have reviewed the patient's chart and labs.  Questions were answered to the patient's satisfaction.     Lyn Records III

## 2013-06-03 NOTE — Progress Notes (Signed)
Patient c/o chest pain. Feels heavy and pressure on mid chest and back. Vital signs stable. Dr. Malachi Bonds notified. Stat EKG ordered and respiratory notified.

## 2013-06-03 NOTE — Progress Notes (Signed)
Report called to Anastasia Fiedler at [facility].

## 2013-06-03 NOTE — Progress Notes (Signed)
Patient taken out by [facility] for transfer to [place].

## 2013-06-03 NOTE — H&P (View-Only) (Signed)
Patient ID: Vanessa Silva, female   DOB: 1960/11/20, 53 y.o.   MRN: [id number]     Primary cardiologist:  Subjective:    Chest pain this morning. Pressure in midchest with no associated symptoms that lasted 45 minutes. Now resolved.   Objective:   Temp:  [97.8 F (36.6 C)-98.3 F (36.8 C)] 97.8 F (36.6 C) (04/08 0501) Pulse Rate:  [87-89] 88 (04/08 0839) Resp:  [16-20] 16 (04/08 0839) BP: (118-126)/(75-83) 118/75 mmHg (04/08 0839) SpO2:  [94 %-99 %] 99 % (04/08 0839) Last BM Date: 06/02/13  Filed Weights   05/31/13 0124 05/31/13 0700  Weight: 215 lb (97.523 kg) 246 lb 4.8 oz (111.721 kg)    Intake/Output Summary (Last 24 hours) at 06/03/13 0855 Last data filed at 06/02/13 1830  Gross per 24 hour  Intake    720 ml  Output      0 ml  Net    720 ml    Telemetry: NSR  Exam:  General: NAD  Resp: CTAB  Cardiac: RRR, no m/r/g, no JVD, no carotid bruits  OI:LNZVJKQ soft, NT, ND  MSK:no LE edema  Neuro: no focal deficits   Lab Results:  Basic Metabolic Panel:  Recent Labs Lab 05/31/13 0123 06/01/13 0503 06/03/13 0452  NA 133* 140 140  K 4.1 3.9 4.1  CL 94* 98 101  CO2 25 28 28   GLUCOSE 288* 257* 246*  BUN 15 11 11   CREATININE 0.47* 0.41* 0.39*  CALCIUM 10.1 9.7 9.8    Liver Function Tests: No results found for this basename: AST, ALT, ALKPHOS, BILITOT, PROT, ALBUMIN,  in the last 168 hours  CBC:  Recent Labs Lab 05/31/13 0123  WBC 9.0  HGB 14.1  HCT 41.3  MCV 83.9  PLT 298    Cardiac Enzymes:  Recent Labs Lab 05/31/13 0712 05/31/13 1313 05/31/13 1910  TROPONINI <0.30 <0.30 <0.30    BNP:  Recent Labs  05/31/13 0123  PROBNP 445.4*    Coagulation: No results found for this basename: INR,  in the last 168 hours  ECG:   Medications:   Scheduled Medications: . antiseptic oral rinse  15 mL Mouth Rinse BID  . aspirin  325 mg Oral Daily  . atorvastatin  20 mg Oral q1800  . carvedilol  6.25 mg Oral BID WC  . docusate  sodium  100 mg Oral Daily  . enoxaparin (LOVENOX) injection  40 mg Subcutaneous Q24H  . furosemide  20 mg Oral Daily  . insulin aspart  0-20 Units Subcutaneous TID WC  . insulin aspart  0-5 Units Subcutaneous QHS  . insulin aspart  4 Units Subcutaneous TID WC  . insulin glargine  25 Units Subcutaneous QHS  . lisinopril  10 mg Oral QHS  . loratadine  10 mg Oral Daily  . pantoprazole  40 mg Oral Q1200  . PARoxetine  40 mg Oral Daily  . sodium chloride  3 mL Intravenous Q12H  . sodium chloride  3 mL Intravenous Q12H     Infusions:     PRN Medications:  sodium chloride, acetaminophen, acetaminophen, ALPRAZolam, nitroGLYCERIN, ondansetron (ZOFRAN) IV, ondansetron, sodium chloride, traMADol     Assessment/Plan    1. Acute systolic/diastolic heart failure  - echo with LVEF 30-35%, restrictive diastolic function. New diagnosis for patient.  - etiology of her heart failure is unclear. She has several CAD risk factors including DM, HTN and a father with an MI in his 54s. Her TSH is elevated with normal free T4  and T3. She also reports non-specific viral URI symptoms on and off for the last few months  - no evidence of acute ischemia - symptoms improved with diuresis, will continue current medication regimen. She is now on oral lasix  2. Chest pain - fairly mixed in the description of the pain, 2 episodes prior to admission and one episode this morning - EKG and cardiac enzymes have not shown evidence of ACS - she has several CAD risk factors including DM, HTN, and father with MI in his 1150s.  - given her history of DM this could be somewhat atypical angina, with her decreased LVEF she is in need of an ischemic evaluation. Initially considered as outpatient however given repeat episodes of this discomfort [facility].  - it true angina, her TIMI score is 2 consistent with lower risk. In setting of lower risk and mixed symptoms will hold off  anticoagulation at this time, consider starting if recurrent symptoms.   3. Hypothyroidism  - management per primary team, elevated TSH with normal free T4 and T3 - repeat studies in 4 weeks with primary physician  4. HTN  - follow bp as CHF meds initiated        Dina RichJonathan Deontaye Civello, M.D., F.A.C.C.

## 2013-06-03 NOTE — Progress Notes (Signed)
[facility] PROGRESS NOTE  Vanessa HaffDawn Y Silva ZOX:[id number] DOB: 02/19/1961 DOA: 05/31/2013 PCP: Colette RibasGOLDING, JOHN CABOT, MD  Assessment/Plan   Acute systolic congestive heart failure. Clinically improved. Echocardiogram reveals diffuse hypokinesis, new diagnosis.  Had another episode of chest pressure this morning, EKG demonstrates possibly worsened T wave inversions in the lateral leads. Nitroglycerin did not really help her pain.  -  Appreciate cardiology assistance -  transferred account for catheterization  -  Advised to stop NSAIDS -  On ACEI/BB/lasix -  Consider addition of spironolactone as outpatient -  Heart failure education  Atypical chest pressure. Resolved. Describes reflux symptoms. Troponins negative.  Start PPI -  [facility]   Diabetes mellitus type II, uncontrolled. Hemoglobin A1c 11.2. CBG still elevated.  Increase total daily insulin by 25% to about 80 units per day  -  Increase lantus to 40 units -  NPH 15 units once to cover until next dose of lantus -  Aspart 10 units with meals -  Decrease to low dose SSI -  Continue HS insulin -  Completed diabetes vidoes  Sick euthyroid with moderately elevated TSH.  T3/T4 wnl -  Repeat TFTs in 4 weeks by PCP  Hypertension. Well controlled  Fibromyalgia. Stop chronic Naprosyn (taking "a lot" lately) or other NSAIDS due to heart failure.    Diet:  Diabetic diet Access:  PIV IVF:  off Proph:  lovenox  Code Status: full Family Communication: patient alone Disposition Plan:  [facility]   Consultants:  Diabetic educator  Cardiology  Nutrition  Procedures:  ECHO  Antibiotics:  none   HPI/Subjective:  Patient c/o substernal chest pressure with radiation to back this AM with associated SOB.  Denies N/V, diaphoresis.  Occurred when walking to bathroom.    Objective: Filed Vitals:   06/02/13 1355 06/02/13 2154 06/03/13 0501 06/03/13 0839  BP: 126/83  122/80 123/79 118/75  Pulse: 89 88 87 88  Temp: 98.3 F (36.8 C) 98.1 F (36.7 C) 97.8 F (36.6 C)   TempSrc: Oral Oral Oral   Resp: 20 20 20 16   Height:      Weight:      SpO2: 97% 94% 94% 99%    Intake/Output Summary (Last 24 hours) at 06/03/13 1002 Last data filed at 06/02/13 1830  Gross per 24 hour  Intake    720 ml  Output      0 ml  Net    720 ml   Filed Weights   05/31/13 0124 05/31/13 0700  Weight: 97.523 kg (215 lb) 111.721 kg (246 lb 4.8 oz)    Exam:   General:  Obese Caucasian female, No acute distress  HEENT:  NCAT, MMM  Cardiovascular:  RRR, nl S1, S2 no mrg, 2+ pulses, warm extremities  Respiratory:  CTAB, no increased WOB  Abdomen:   NABS, soft, NT/ND  MSK:   Normal tone and bulk, trace LEE  Neuro:  Grossly intact  Data Reviewed: Basic Metabolic Panel:  Recent Labs Lab 05/31/13 0123 06/01/13 0503 06/03/13 0452  NA 133* 140 140  K 4.1 3.9 4.1  CL 94* 98 101  CO2 25 28 28   GLUCOSE 288* 257* 246*  BUN 15 11 11   CREATININE 0.47* 0.41* 0.39*  CALCIUM 10.1 9.7 9.8   Liver Function Tests: No results found for this basename: AST, ALT, ALKPHOS, BILITOT, PROT, ALBUMIN,  in the last 168 hours No results found for this basename: LIPASE, AMYLASE,  in the last 168  hours No results found for this basename: AMMONIA,  in the last 168 hours CBC:  Recent Labs Lab 05/31/13 0123  WBC 9.0  NEUTROABS 4.6  HGB 14.1  HCT 41.3  MCV 83.9  PLT 298   Cardiac Enzymes:  Recent Labs Lab 05/31/13 0712 05/31/13 1313 05/31/13 1910  TROPONINI <0.30 <0.30 <0.30   BNP (last 3 results)  Recent Labs  05/31/13 0123  PROBNP 445.4*   CBG:  Recent Labs Lab 06/02/13 0735 06/02/13 1118 06/02/13 1612 06/02/13 2152 06/03/13 0740  GLUCAP 273* 293* 154* 274* 249*    No results found for this or any previous visit (from the past 240 hour(s)).   Studies: No results found.  Scheduled Meds: . antiseptic oral rinse  15 mL Mouth Rinse BID  . [START  ON 06/04/2013] aspirin  81 mg Oral Daily  . atorvastatin  20 mg Oral q1800  . carvedilol  6.25 mg Oral BID WC  . docusate sodium  100 mg Oral Daily  . enoxaparin (LOVENOX) injection  40 mg Subcutaneous Q24H  . furosemide  20 mg Oral Daily  . insulin aspart  0-20 Units Subcutaneous TID WC  . insulin aspart  0-5 Units Subcutaneous QHS  . insulin aspart  4 Units Subcutaneous TID WC  . insulin glargine  25 Units Subcutaneous QHS  . lisinopril  10 mg Oral QHS  . loratadine  10 mg Oral Daily  . pantoprazole  40 mg Oral Q1200  . PARoxetine  40 mg Oral Daily  . sodium chloride  3 mL Intravenous Q12H  . sodium chloride  3 mL Intravenous Q12H   Continuous Infusions:   Principal Problem:   Acute systolic CHF (congestive heart failure) Active Problems:   Pulmonary edema   DM (diabetes mellitus), type 2, uncontrolled   Tachycardia   HTN (hypertension)   Chest discomfort   Fibromyalgia   Sick-euthyroid syndrome    Time spent: 30 min    Renae Fickle  [facility] Pager [phone]. If 7PM-7AM, please contact night-coverage at [email or web address], password [facility] 06/03/2013, 10:02 AM  LOS: 3 days

## 2013-06-03 NOTE — Progress Notes (Signed)
Inpatient Diabetes Program Recommendations  AACE/ADA: New Consensus Statement on Inpatient Glycemic Control (2013)  Target Ranges:  Prepandial:   less than 140 mg/dL      Peak postprandial:   less than 180 mg/dL (1-2 hours)      Critically ill patients:  140 - 180 mg/dL   Results for Vanessa Silva, Vanessa Silva (MRN [id number]) as of 06/03/2013 08:29  Ref. Range 06/02/2013 07:35 06/02/2013 11:18 06/02/2013 16:12 06/02/2013 21:52 06/03/2013 07:40  Glucose-Capillary Latest Range: 70-99 mg/dL 702 (H) 637 (H) 858 (H) 274 (H) 249 (H)   Diabetes history: DM2 Outpatient Diabetes medications: Lantus 35 units QHS, Glyburide 5 mg BID, Metformin 1000 mg BID Current orders for Inpatient glycemic control: Lantus 25 units QHS, Novolog 0-20 units AC, Novolog 0-5 units HS  Inpatient Diabetes Program Recommendations Insulin - Basal: Please increase Lantus to 33 units QHS (based on 111 kg x 0.3 units). Insulin - Meal Coverage: Please consider ordering Novolog 5 units TID with meals for meal coverage. HgbA1C: A1C 11.2% on 05/31/13.  Thanks, Orlando Penner, RN, MSN, CCRN Diabetes Coordinator Inpatient Diabetes Program [phone] (Team Pager) [phone] ([place] office) [phone] [facility] office)

## 2013-06-03 NOTE — Progress Notes (Signed)
Patient ID: SHERRION CLUFF, female   DOB: 1960/11/20, 53 y.o.   MRN: [id number]     Primary cardiologist:  Subjective:    Chest pain this morning. Pressure in midchest with no associated symptoms that lasted 45 minutes. Now resolved.   Objective:   Temp:  [97.8 F (36.6 C)-98.3 F (36.8 C)] 97.8 F (36.6 C) (04/08 0501) Pulse Rate:  [87-89] 88 (04/08 0839) Resp:  [16-20] 16 (04/08 0839) BP: (118-126)/(75-83) 118/75 mmHg (04/08 0839) SpO2:  [94 %-99 %] 99 % (04/08 0839) Last BM Date: 06/02/13  Filed Weights   05/31/13 0124 05/31/13 0700  Weight: 215 lb (97.523 kg) 246 lb 4.8 oz (111.721 kg)    Intake/Output Summary (Last 24 hours) at 06/03/13 0855 Last data filed at 06/02/13 1830  Gross per 24 hour  Intake    720 ml  Output      0 ml  Net    720 ml    Telemetry: NSR  Exam:  General: NAD  Resp: CTAB  Cardiac: RRR, no m/r/g, no JVD, no carotid bruits  OI:LNZVJKQ soft, NT, ND  MSK:no LE edema  Neuro: no focal deficits   Lab Results:  Basic Metabolic Panel:  Recent Labs Lab 05/31/13 0123 06/01/13 0503 06/03/13 0452  NA 133* 140 140  K 4.1 3.9 4.1  CL 94* 98 101  CO2 25 28 28   GLUCOSE 288* 257* 246*  BUN 15 11 11   CREATININE 0.47* 0.41* 0.39*  CALCIUM 10.1 9.7 9.8    Liver Function Tests: No results found for this basename: AST, ALT, ALKPHOS, BILITOT, PROT, ALBUMIN,  in the last 168 hours  CBC:  Recent Labs Lab 05/31/13 0123  WBC 9.0  HGB 14.1  HCT 41.3  MCV 83.9  PLT 298    Cardiac Enzymes:  Recent Labs Lab 05/31/13 0712 05/31/13 1313 05/31/13 1910  TROPONINI <0.30 <0.30 <0.30    BNP:  Recent Labs  05/31/13 0123  PROBNP 445.4*    Coagulation: No results found for this basename: INR,  in the last 168 hours  ECG:   Medications:   Scheduled Medications: . antiseptic oral rinse  15 mL Mouth Rinse BID  . aspirin  325 mg Oral Daily  . atorvastatin  20 mg Oral q1800  . carvedilol  6.25 mg Oral BID WC  . docusate  sodium  100 mg Oral Daily  . enoxaparin (LOVENOX) injection  40 mg Subcutaneous Q24H  . furosemide  20 mg Oral Daily  . insulin aspart  0-20 Units Subcutaneous TID WC  . insulin aspart  0-5 Units Subcutaneous QHS  . insulin aspart  4 Units Subcutaneous TID WC  . insulin glargine  25 Units Subcutaneous QHS  . lisinopril  10 mg Oral QHS  . loratadine  10 mg Oral Daily  . pantoprazole  40 mg Oral Q1200  . PARoxetine  40 mg Oral Daily  . sodium chloride  3 mL Intravenous Q12H  . sodium chloride  3 mL Intravenous Q12H     Infusions:     PRN Medications:  sodium chloride, acetaminophen, acetaminophen, ALPRAZolam, nitroGLYCERIN, ondansetron (ZOFRAN) IV, ondansetron, sodium chloride, traMADol     Assessment/Plan    1. Acute systolic/diastolic heart failure  - echo with LVEF 30-35%, restrictive diastolic function. New diagnosis for patient.  - etiology of her heart failure is unclear. She has several CAD risk factors including DM, HTN and a father with an MI in his 54s. Her TSH is elevated with normal free T4  and T3. She also reports non-specific viral URI symptoms on and off for the last few months  - no evidence of acute ischemia - symptoms improved with diuresis, will continue current medication regimen. She is now on oral lasix  2. Chest pain - fairly mixed in the description of the pain, 2 episodes prior to admission and one episode this morning - EKG and cardiac enzymes have not shown evidence of ACS - she has several CAD risk factors including DM, HTN, and father with MI in his 50s.  - given her history of DM this could be somewhat atypical angina, with her decreased LVEF she is in need of an ischemic evaluation. Initially considered as outpatient however given repeat episodes of this discomfort will arrange transfer to [place] with LHC/RHC.  - it true angina, her TIMI score is 2 consistent with lower risk. In setting of lower risk and mixed symptoms will hold off  anticoagulation at this time, consider starting if recurrent symptoms.   3. Hypothyroidism  - management per primary team, elevated TSH with normal free T4 and T3 - repeat studies in 4 weeks with primary physician  4. HTN  - follow bp as CHF meds initiated        Kaede Clendenen, M.D., F.A.C.C.  

## 2013-06-03 NOTE — CV Procedure (Signed)
     Left and Right Heart Catheterization with Coronary Angiography  Report  Vanessa Silva  53 y.o.  female 1960-10-16  Procedure Date: 06/03/2013 Referring Physician: Dina Rich, M.D. Primary Cardiologist: Dina Rich, M.D.  INDICATIONS: Acute heart failure with associated chest tightness in a patient with long-standing diabetes  PROCEDURE: 1. Left heart catheterization; 2. Right heart catheterization; 3. Coronary angiography; 4. Left ventriculography  CONSENT:  The risks, benefits, and details of the procedure were explained in detail to the patient. Risks including death, stroke, heart attack, kidney injury, allergy, limb ischemia, bleeding and radiation injury were discussed.  The patient verbalized understanding and wanted to proceed.  Informed written consent was obtained.  PROCEDURE TECHNIQUE:  After Xylocaine anesthesia a 5 French Slender sheath was placed in the right radial artery with an angiocath and the modified Seldinger technique. We  exchanged a right antecubital vein Angiocath over a micropuncture wire and placed a brachial 5 French sheath. We were unable to advance a 5 French balloon tipped catheter or an angioplasty guidewire beyond the arm. We turned our attention to the right femoral vein where we were able to place a 7 French sheath and perform right heart catheterization using a 7 French Swan-Ganz catheter. Coronary angiography was done using a 5 F JR 4 and JL 3.5 cm diagnostic catheters.  Left ventriculography was done using the JR 4 catheter and hand injection. A main pulmonary artery and a standing aortic oxygen saturation was obtained. A pullback pressure through the right heart was performed with a Swan-Ganz catheter.  Hemostasis was achieved in the right femoral vein with manual compression. A wrist band was placed on the right radial artery cath site with good hemostasis. The right antecubital vein sheath site achieved hemostasis by manual compression.   CONTRAST:  Total of 110 cc.  COMPLICATIONS:  None   HEMODYNAMICS:  Aortic pressure 116/75 mmHg; LV pressure 120/7 mmHg; LVEDP 19 mm mercury; RA 8 mm mercury; RV 32/8 mmHg; PA 32/20 mmHg; PCWP(mean) 14 mm mercury; Cardiac Output 5. 59 L per minute by thermal dilution and 6.21 L per minute by Fick; AV gradient 0.  ANGIOGRAPHIC DATA:   The left main coronary artery is widely patent.  The left anterior descending artery is widely patent. The proximal vessel contains luminal irregularities with up to 30% narrowing.  The left circumflex artery is widely patent.  The right coronary artery is widely patent.  LEFT VENTRICULOGRAM:  Left ventricular angiogram was done in the 30 RAO projection and revealed a mildly dilated left ventricular cavity with global hypokinesis and an estimated ejection fraction of 35%.   IMPRESSIONS:  1. No significant obstructive coronary disease. Luminal irregularities are noted in the proximal to mid LAD.  2. Mildly dilated and moderately to severely hypocontractile left ventricle with an estimated ejection fraction of 35%  3. Normal right heart pressures   RECOMMENDATION:  Medical therapy to include beta blocker, angiotensin/renin blockade, diuretic therapy, as well as lipid and diabetes management.

## 2013-06-04 ENCOUNTER — Encounter (HOSPITAL_COMMUNITY): Payer: Self-pay | Admitting: Physician Assistant

## 2013-06-04 DIAGNOSIS — I2510 Atherosclerotic heart disease of native coronary artery without angina pectoris: Secondary | ICD-10-CM | POA: Diagnosis present

## 2013-06-04 DIAGNOSIS — I10 Essential (primary) hypertension: Secondary | ICD-10-CM | POA: Insufficient documentation

## 2013-06-04 DIAGNOSIS — I5042 Chronic combined systolic (congestive) and diastolic (congestive) heart failure: Secondary | ICD-10-CM | POA: Diagnosis present

## 2013-06-04 DIAGNOSIS — E119 Type 2 diabetes mellitus without complications: Secondary | ICD-10-CM | POA: Diagnosis present

## 2013-06-04 DIAGNOSIS — I5043 Acute on chronic combined systolic (congestive) and diastolic (congestive) heart failure: Secondary | ICD-10-CM

## 2013-06-04 DIAGNOSIS — E785 Hyperlipidemia, unspecified: Secondary | ICD-10-CM

## 2013-06-04 LAB — GLUCOSE, CAPILLARY
GLUCOSE-CAPILLARY: 278 mg/dL — AB (ref 70–99)
Glucose-Capillary: 158 mg/dL — ABNORMAL HIGH (ref 70–99)
Glucose-Capillary: 174 mg/dL — ABNORMAL HIGH (ref 70–99)
Glucose-Capillary: 225 mg/dL — ABNORMAL HIGH (ref 70–99)

## 2013-06-04 LAB — BASIC METABOLIC PANEL
BUN: 12 mg/dL (ref 6–23)
CO2: 23 mEq/L (ref 19–32)
CREATININE: 0.45 mg/dL — AB (ref 0.50–1.10)
Calcium: 9.6 mg/dL (ref 8.4–10.5)
Chloride: 99 mEq/L (ref 96–112)
Glucose, Bld: 167 mg/dL — ABNORMAL HIGH (ref 70–99)
POTASSIUM: 4 meq/L (ref 3.7–5.3)
Sodium: 138 mEq/L (ref 137–147)

## 2013-06-04 MED ORDER — CARVEDILOL 6.25 MG PO TABS: 6.2500 mg | ORAL_TABLET | Freq: Two times a day (BID) | ORAL | Status: DC

## 2013-06-04 MED ORDER — NITROGLYCERIN 0.4 MG SL SUBL: 0.4000 mg | SUBLINGUAL_TABLET | SUBLINGUAL | Status: DC | PRN

## 2013-06-04 MED ORDER — METFORMIN HCL 1000 MG PO TABS: 1000.0000 mg | ORAL_TABLET | Freq: Two times a day (BID) | ORAL | Status: DC

## 2013-06-04 MED ORDER — ASPIRIN 81 MG PO TBEC: 81.0000 mg | DELAYED_RELEASE_TABLET | Freq: Every day | ORAL | Status: DC

## 2013-06-04 MED ORDER — TRAMADOL HCL 50 MG PO TABS: 50.0000 mg | ORAL_TABLET | Freq: Four times a day (QID) | ORAL | Status: DC | PRN

## 2013-06-04 MED ORDER — ATORVASTATIN CALCIUM 20 MG PO TABS: 20.0000 mg | ORAL_TABLET | Freq: Every day | ORAL | Status: DC

## 2013-06-04 MED ORDER — FUROSEMIDE 20 MG PO TABS: 20.0000 mg | ORAL_TABLET | Freq: Every day | ORAL | Status: DC

## 2013-06-04 NOTE — Progress Notes (Deleted)
Patient Name: Vanessa Silva Date of Encounter: 06/04/2013     Principal Problem:   Acute systolic CHF (congestive heart failure) Active Problems:   Pulmonary edema   DM (diabetes mellitus), type 2, uncontrolled   Tachycardia   HTN (hypertension)   Chest discomfort   Fibromyalgia   Sick-euthyroid syndrome   Acute systolic heart failure    SUBJECTIVE  Feeling good. Ready to go home.   CURRENT MEDS . antiseptic oral rinse  15 mL Mouth Rinse BID  . aspirin  81 mg Oral Daily  . atorvastatin  20 mg Oral q1800  . carvedilol  6.25 mg Oral BID WC  . docusate sodium  100 mg Oral Daily  . enoxaparin (LOVENOX) injection  40 mg Subcutaneous Q24H  . furosemide  20 mg Oral Daily  . insulin aspart  0-5 Units Subcutaneous QHS  . insulin aspart  0-9 Units Subcutaneous TID WC  . insulin aspart  10 Units Subcutaneous TID WC  . insulin glargine  40 Units Subcutaneous QHS  . lisinopril  10 mg Oral QHS  . loratadine  10 mg Oral Daily  . pantoprazole  40 mg Oral Q1200  . PARoxetine  40 mg Oral Daily    OBJECTIVE  Filed Vitals:   06/03/13 1854 06/03/13 2128 06/04/13 0155 06/04/13 0547  BP: 119/59 120/52 133/77 124/74  Pulse: 84 89 86 84  Temp: 98.3 F (36.8 C) 98.1 F (36.7 C) 98.2 F (36.8 C) 98.1 F (36.7 C)  TempSrc: Oral Oral Oral Oral  Resp: 18 18 18 18   Height:      Weight:    237 lb 11.2 oz (107.82 kg)  SpO2: 96% 95% 97% 95%    Intake/Output Summary (Last 24 hours) at 06/04/13 1158 Last data filed at 06/04/13 0852  Gross per 24 hour  Intake 878.33 ml  Output   1100 ml  Net -221.67 ml   Filed Weights   05/31/13 0124 05/31/13 0700 06/04/13 0547  Weight: 215 lb (97.523 kg) 246 lb 4.8 oz (111.721 kg) 237 lb 11.2 oz (107.82 kg)    PHYSICAL EXAM  General: Pleasant, NAD. obese Neuro: Alert and oriented X 3. Moves all extremities spontaneously. Psych: Normal affect. HEENT:  Normal  Neck: Supple without bruits or JVD. Lungs:  Resp regular and unlabored,  CTA. Heart: RRR no s3, s4, or murmurs. Abdomen: Soft, non-tender, non-distended, BS + x 4.  Extremities: No clubbing, cyanosis or edema. DP/PT/Radials 2+ and equal bilaterally. Groin site stable  Accessory Clinical Findings  CBC  Recent Labs  06/03/13 2027  WBC 9.4  HGB 14.5  HCT 42.1  MCV 84.2  PLT 283   Basic Metabolic Panel  Recent Labs  06/03/13 0452 06/03/13 2027 06/04/13 0528  NA 140  --  138  K 4.1  --  4.0  CL 101  --  99  CO2 28  --  23  GLUCOSE 246*  --  167*  BUN 11  --  12  CREATININE 0.39* 0.42* 0.45*  CALCIUM 9.8  --  9.6    TELE  NSR  Radiology/Studies  Ct Angio Chest Pe W/cm &/or Wo Cm  05/31/2013   CLINICAL DATA:  Shortness of breath for several hours. Chest heaviness. Cough.  EXAM: CT ANGIOGRAPHY CHEST WITH CONTRAST  TECHNIQUE: Multidetector CT imaging of the chest was performed using the standard protocol during bolus administration of intravenous contrast. Multiplanar CT image reconstructions and MIPs were obtained to evaluate the vascular anatomy.  CONTRAST:  OMNIPAQUE IOHEXOL 350 MG/ML SOLN  COMPARISON:  Chest radiograph performed earlier today at 1:29 a.m.  FINDINGS: There is no evidence of pulmonary embolus.  Trace bilateral pleural effusions are noted. Diffuse interstitial prominence is seen, with patchy bilateral airspace opacities, compatible with mild pulmonary edema. There is no evidence of pneumothorax. No masses are identified; no abnormal focal contrast enhancement is seen.  The mediastinum is unremarkable in appearance. No mediastinal lymphadenopathy is seen. No pericardial effusion is identified. The great vessels are grossly unremarkable in appearance. No axillary lymphadenopathy is seen. The visualized portions of the thyroid gland are unremarkable in appearance.  The visualized portions of the liver and spleen are unremarkable.  No acute osseous abnormalities are seen.  Review of the MIP images confirms the above findings.   IMPRESSION: 1. No evidence of pulmonary embolus. 2. Trace bilateral pleural effusions noted. Diffuse interstitial prominence seen, with patchy bilateral airspace opacities, compatible with mild pulmonary edema.     Dg Chest Portable 1 View  05/31/2013   CLINICAL DATA:  Shortness of breath and chest pain.  EXAM: PORTABLE CHEST - 1 VIEW  COMPARISON:  None.  FINDINGS: The lungs are well-aerated. Vascular congestion is noted, with increased interstitial markings, raising concern for mild pulmonary edema. No pleural effusion or pneumothorax is seen. The lung apices are not well assessed due to overlying osseous structures.  The cardiomediastinal silhouette is borderline normal in size. No acute osseous abnormalities are seen.  IMPRESSION: Vascular congestion, with increased interstitial markings, raising concern for mild pulmonary edema.    LHC IMPRESSIONS:  1. No significant obstructive coronary disease. Luminal irregularities are noted in the proximal to mid LAD.  2. Mildly dilated and moderately to severely hypocontractile left ventricle with an estimated ejection fraction of 35%  3. Normal right heart pressures  ASSESSMENT AND PLAN [name] is a 53 y.o.female history of HTN, fibromylagia, GERD, DM admitted to [facility] on 05/31/13 with SOB, DOE, and palpitations. She was found to have newly diagnosed systolic and diastolic heart failure (LVEF 30-35%, restrictive diastolic dysfunction) this admission. She developed chest pain and was transferred to [facility] for cardiac catheterization. Cardiac cath revealed non obstructive CAD.  Acute systolic/diastolic heart failure  -- echo with LVEF 30-35%, restrictive diastolic function. New diagnosis for patient.  -- etiology of her heart failure is unclear. She has several CAD risk factors including DM, HTN and a father with an MI in his 60s. Her TSH is elevated with normal free T4 and T3. She also reports non-specific viral URI symptoms on and off for the last few months   -- no evidence of acute ischemia, LHC w/ non obst CAD -- symptoms improved with diuresis, will continue current medication regimen. She is now on oral lasix  -- weights and I/Os appear to be inaccurate  Non-obstructive CAD: she had chest pain in the setting of CAD risk factors including DM, HTN, and father with MI in his 96s.  -- She ruled out for MI with cardiac enzymes and stable ECG. She was transferred to [facility] yesterday for LHC/RHC which revealed no significant obstructive coronary disease. There were luminal irregularities are noted in the proximal to mid LAD and mildly dilated and moderately to severely hypocontractile left ventricle with an estimated ejection fraction of 35%. She had normal right heart pressures  Hypothyroidism  - management per primary team, elevated TSH with normal free T4 and T3  - repeat studies in 4 weeks with primary physician   HTN  - follow  bp as CHF meds initiated   Signed, Thereasa Parkin [facility]  Pager [phone]

## 2013-06-04 NOTE — Discharge Instructions (Signed)

## 2013-06-04 NOTE — Progress Notes (Signed)
Nutrition Education Note  RD consulted for nutrition education regarding pt's diet. Pt is currently on a heart healthy carb modified diet.  RD provided "Low Sodium Nutrition Therapy" handout from the Academy of Nutrition and [employer]etics. Reviewed patient's dietary recall. Provided examples on ways to decrease sodium intake in diet. Discouraged intake of processed foods and use of salt shaker. Encouraged fresh fruits and vegetables as well as whole grain sources of carbohydrates to maximize fiber intake.   RD discussed why it is important for patient to adhere to diet recommendations, and emphasized the role of fluids, foods to avoid, and importance of weighing self daily. Teach back method used.  RD also provided for nutrition education regarding diabetes.   Lab Results  Component Value Date   HGBA1C 11.2* 05/31/2013    RD provided "Carbohydrate Counting for People with Diabetes" handout from the Academy of Nutrition and Dietetics. Discussed different food groups and their effects on blood sugar, emphasizing carbohydrate-containing foods. Provided list of carbohydrates and recommended serving sizes of common foods.  Discussed importance of controlled and consistent carbohydrate intake throughout the day. Provided examples of ways to balance meals/snacks and encouraged intake of high-fiber, whole grain complex carbohydrates. Teach back method used.  Expect good compliance.  Body mass index is 36.15 kg/(m^2). Pt meets criteria for Obesity based on current BMI.  Current diet order is Heart Healthy/Carb Modified, patient is consuming approximately 100% of meals at this time. Labs and medications reviewed. No further nutrition interventions warranted at this time. RD contact information provided. If additional nutrition issues arise, please re-consult RD.   Ian Malkin RD, LDN Inpatient Clinical Dietitian Pager: [phone] After Hours Pager: [phone]
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
## 2013-06-04 NOTE — Discharge Summary (Signed)
Discharge Summary   Patient ID: ANAY RATTS MRN: [id number], DOB/AGE: [birth year] 53 y.o. Admit date: 05/31/2013 D/C date:     06/04/2013  Primary Cardiologist: Dr. Wyline Mood (new)  Principal Problem:   Acute on chronic combined systolic and diastolic congestive heart failure Active Problems:   DM (diabetes mellitus), type 2, uncontrolled   HTN (hypertension)   Fibromyalgia   Sick-euthyroid syndrome   CAD (coronary artery disease)   Diabetes mellitus without complication   HLD (hyperlipidemia)   Morbid obesity Body mass index is 36.15 kg/(m^2).   Admission Dates: 05/31/13-06/04/13 Discharge Diagnosis: Acute on chronic systolic and diastolic CHF (EF 46-56%): discharge weight 237 lbs.  HPI: [name] is a 53 y.o.female history of HTN, fibromylagia, GERD, DM admitted to [facility] on 05/31/13 with SOB, DOE, and palpitations. She was found to be in newly diagnosed mixed systolic and diastolic heart failure (LVEF 30-35%, restrictive diastolic dysfunction) upon admission. She also has some episodes of chest pain and was transferred to [facility] for cardiac catheterization. Cardiac cath revealed non obstructive CAD.   Hospital Course Acute systolic/diastolic heart failure. This is a new diagnosis for the patient and the etiology for the heart failure is unclear. CXR has pulmonary vascular congestion and BNP was slightly elevated at 445.4. Echo on 05/31/2013 revealed LV EF: 30-35%, mod LVH, diffuse hypokinesis and restrictive diastolic function. There was mild MR and mod LA dilation. She also reports non-specific viral URI symptoms on and off for the last few months. Ischemic etiology was ruled out on 06/03/13 with LHC revealing non obstructive CAD. She diuresed well on IV Lasix and had significant improvements in her symptoms. She was transitioned to PO Lasix on 06/01/13 and is currently on 20mg  Lasix daily. Her daily weights and I/Os appear to be inaccurate; however she appears to be at her dry weight today  (237lbs and is feeling well.)    Chest pain: She had a few episodes of chest pain during her admission. She ruled out for MI with negative cardiac enzymes and stable ECGs. However, due to her many CRFs it was decided to transfer her to [facility] for a LHC/RHC. She underwent heart cath on 06/03/13 which revealed no significant obstructive coronary disease. There were luminal irregularities noted in the proximal to mid LAD and mildly dilated and moderately to severely hypocontractile left ventricle with an estimated ejection fraction of 35%. She had normal right heart pressures.  Hypothyroidism- Her TSH was noted to be elevated. However, her Free T3/T4 returned normal. Repeat studies in 4 weeks with primary physician is recommended.   Fibromyalgia. She has been told to stop chronic Naprosyn or other NSAIDS due to heart failure. She was put on Tramadol in the hospital which worked well.   DM- Metformin was held due to contrast dye exposure during cardiac catheterization.   Elevated D-Dimer: D-dimer was noted to be minimally elevated (1.00), but CT scan noted no evidence of pulmonary embolus.     The patient has had an uncomplicated hospital course and is recovering well. The femoral catheter site is stable. She has been seen by Dr. Allyson Sabal today and deemed stable for discharge home. All follow-up appointments have been scheduled. Discharge medications include atorvastatin 20mg , ASA, carvedilol 6.25 mg BID, quinapril 20mg , Lasix 20mg  daily and SL NTG. Her metformin will be resumed on Saturday morning 06/06/13 (>48 hours after contrast dye exposure). She has been informed that she should discontinue ibuprofen and naproxen and has been provided with a Rx for tramadol instead.  She has also been provided with a work excuse note.   Discharge Vitals: Blood pressure 124/74, pulse 84, temperature 98.1 F (36.7 C), temperature source Oral, resp. rate 18, height  (1.727 m), weight 237 lb 11.2 oz (107.82 kg), last  menstrual period 06/30/2012, SpO2 95.00%.  Labs: Lab Results  Component Value Date   WBC 9.4 06/03/2013   HGB 14.5 06/03/2013   HCT 42.1 06/03/2013   MCV 84.2 06/03/2013   PLT 283 06/03/2013     Recent Labs Lab 06/04/13 0528  NA 138  K 4.0  CL 99  CO2 23  BUN 12  CREATININE 0.45*  CALCIUM 9.6  GLUCOSE 167*    Lab Results  Component Value Date   CHOL 207* 06/01/2013   HDL 51 06/01/2013   LDLCALC 366* 06/01/2013   TRIG 88 06/01/2013   Lab Results  Component Value Date   DDIMER 1.00* 05/31/2013    Diagnostic Studies/Procedures   Ct Angio Chest Pe W/cm &/or Wo Cm  05/31/2013   CLINICAL DATA:  Shortness of breath for several hours. Chest heaviness. Cough.  EXAM: CT ANGIOGRAPHY CHEST WITH CONTRAST  TECHNIQUE: Multidetector CT imaging of the chest was performed using the standard protocol during bolus administration of intravenous contrast. Multiplanar CT image reconstructions and MIPs were obtained to evaluate the vascular anatomy.  CONTRAST:  OMNIPAQUE IOHEXOL 350 MG/ML SOLN  COMPARISON:  Chest radiograph performed earlier today at 1:29 a.m.  FINDINGS: There is no evidence of pulmonary embolus.  Trace bilateral pleural effusions are noted. Diffuse interstitial prominence is seen, with patchy bilateral airspace opacities, compatible with mild pulmonary edema. There is no evidence of pneumothorax. No masses are identified; no abnormal focal contrast enhancement is seen.  The mediastinum is unremarkable in appearance. No mediastinal lymphadenopathy is seen. No pericardial effusion is identified. The great vessels are grossly unremarkable in appearance. No axillary lymphadenopathy is seen. The visualized portions of the thyroid gland are unremarkable in appearance.  The visualized portions of the liver and spleen are unremarkable.  No acute osseous abnormalities are seen.  Review of the MIP images confirms the above findings.  IMPRESSION: 1. No evidence of pulmonary embolus. 2. Trace bilateral  pleural effusions noted. Diffuse interstitial prominence seen, with patchy bilateral airspace opacities, compatible with mild pulmonary edema.     Dg Chest Portable 1 View  05/31/2013   CLINICAL DATA:  Shortness of breath and chest pain.  EXAM: PORTABLE CHEST - 1 VIEW  COMPARISON:  None.  FINDINGS: The lungs are well-aerated. Vascular congestion is noted, with increased interstitial markings, raising concern for mild pulmonary edema. No pleural effusion or pneumothorax is seen. The lung apices are not well assessed due to overlying osseous structures.  The cardiomediastinal silhouette is borderline normal in size. No acute osseous abnormalities are seen.  IMPRESSION: Vascular congestion, with increased interstitial markings, raising concern for mild pulmonary edema.      2D ECHO  Study Date: 05/31/2013 LV EF: 35% LV EF: 30% - 35% Study Conclusions - Left ventricle: The cavity size was mildly dilated. Wall thickness was increased in a pattern of moderate LVH. Systolic function was moderately to severely reduced. The estimated ejection fraction was 35%, in the range of 30% to 35%. Diffuse hypokinesis. Doppler parameters are consistent with restrictive physiology, indicative of decreased left ventricular diastolic compliance and/or increased left atrial pressure. - Mitral valve: Calcified annulus. Mild regurgitation. - Left atrium: The atrium was moderately dilated.   Discharge Medications     Medication  List    STOP taking these medications       naproxen 500 MG tablet  Commonly known as:  NAPROSYN      TAKE these medications       ALPRAZolam 0.5 MG tablet  Commonly known as:  XANAX  Take 0.5 mg by mouth at bedtime as needed for anxiety.     aspirin 81 MG EC tablet  Take 1 tablet (81 mg total) by mouth at bedtime.     atorvastatin 20 MG tablet  Commonly known as:  LIPITOR  Take 1 tablet (20 mg total) by mouth daily at 6 PM.     carvedilol 6.25 MG tablet  Commonly known  as:  COREG  Take 1 tablet (6.25 mg total) by mouth 2 (two) times daily with a meal.     clobetasol cream 0.05 %  Commonly known as:  TEMOVATE  Apply 1-2 application topically daily.     docusate sodium 100 MG capsule  Commonly known as:  COLACE  Take 100 mg by mouth daily.     esomeprazole 20 MG capsule  Commonly known as:  NEXIUM  Take 20 mg by mouth 2 (two) times daily before a meal.     furosemide 20 MG tablet  Commonly known as:  LASIX  Take 1 tablet (20 mg total) by mouth daily.     glyBURIDE 5 MG tablet  Commonly known as:  DIABETA  Take 5 mg by mouth 2 (two) times daily with a meal.     LANTUS 100 UNIT/ML injection  Generic drug:  insulin glargine  Inject 35 Units into the skin at bedtime.     loratadine 10 MG tablet  Commonly known as:  CLARITIN  Take 10 mg by mouth daily.     metFORMIN 1000 MG tablet  Commonly known as:  GLUCOPHAGE  Take 1 tablet (1,000 mg total) by mouth 2 (two) times daily with a meal.  Start taking on:  06/05/2013     montelukast 10 MG tablet  Commonly known as:  SINGULAIR  Take 10 mg by mouth daily.     nitroGLYCERIN 0.4 MG SL tablet  Commonly known as:  NITROSTAT  Place 1 tablet (0.4 mg total) under the tongue every 5 (five) minutes as needed for chest pain.     PARoxetine 40 MG tablet  Commonly known as:  PAXIL  Take 40 mg by mouth every morning.     PREVAIL NEXUS UNDERPAD [id number]"X36" Misc  by Does not apply route.     quinapril 20 MG tablet  Commonly known as:  ACCUPRIL  Take 10 mg by mouth at bedtime.     traMADol 50 MG tablet  Commonly known as:  ULTRAM  Take 1 tablet (50 mg total) by mouth every 6 (six) hours as needed for moderate pain.        Disposition   The patient will be discharged in stable condition to home.  Future Appointments Provider Department Dept Phone   06/10/2013 2:50 PM Jodelle GrossKathryn M Lawrence, NP [facility] [place] [phone]     Follow-up Information   Follow up with Joni ReiningKathryn Lawrence, NP On  06/10/2013. (@ 2:50 pm)    Specialty:  Nurse Practitioner   Contact information:   [address] [place] [id number] [phone]         Duration of Discharge Encounter: Greater than 30 minutes including physician and PA time.  Signed, Thereasa ParkinKathryn Stern PA-C 06/04/2013, 2:03 PM

## 2013-06-10 ENCOUNTER — Ambulatory Visit (INDEPENDENT_AMBULATORY_CARE_PROVIDER_SITE_OTHER): Payer: Federal, State, Local not specified - PPO | Admitting: Adult Health

## 2013-06-10 ENCOUNTER — Encounter: Payer: Self-pay | Admitting: Adult Health

## 2013-06-10 VITALS — BP 134/83 | HR 87 | Ht 68.0 in | Wt 241.0 lb

## 2013-06-10 DIAGNOSIS — I5042 Chronic combined systolic (congestive) and diastolic (congestive) heart failure: Secondary | ICD-10-CM

## 2013-06-10 DIAGNOSIS — I509 Heart failure, unspecified: Secondary | ICD-10-CM

## 2013-06-10 DIAGNOSIS — I10 Essential (primary) hypertension: Secondary | ICD-10-CM

## 2013-06-10 DIAGNOSIS — I2510 Atherosclerotic heart disease of native coronary artery without angina pectoris: Secondary | ICD-10-CM

## 2013-06-10 MED ORDER — CARVEDILOL 12.5 MG PO TABS: 12.5000 mg | ORAL_TABLET | Freq: Two times a day (BID) | ORAL | Status: DC

## 2013-06-10 NOTE — Assessment & Plan Note (Signed)
The patient is on appropriate medications, blood pressures not well controlled for her current LV function. I will increase her carvedilol 6.25 mg twice a day to 12.5 mg twice a day. She will followup with [place] in a couple weeks for reevaluation of her current status and then be seen approximately once a month and thereafter. She is to continue to on a low-sodium diet. No other medications are changed. She is to call us for any fluid retention. The majority of her fluid is accumulating in her abdomen and does not appear in her lower extremities according to her subjective response to fluid retention symptoms.
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
## 2013-06-10 NOTE — Patient Instructions (Signed)
Your physician recommends that you schedule a follow-up appointment in: 2 weeks    Your physician has recommended you make the following change in your medication:    INCREASE Coreg to 12.5 mg twice a day   Please walk 15 minutes a day for the next two weeks   Thank you for choosing [facility] !

## 2013-06-10 NOTE — Progress Notes (Deleted)
Name: Vanessa Silva    DOB: [birth year]  Age: 53 y.o.  MR#: [id number]       PCP:  Colette Ribas, MD      Insurance: Payor: BLUE CR[employer] BLUE [employer]IELD / Plan: BCBS/[employer] PPO / Product Type: *No Product type* /   CC:    Chief Complaint  Patient presents with  . Congestive Heart Failure  . Hypertension    VS Filed Vitals:   06/10/13 1455  BP: 134/83  Pulse: 87  Height: 5\' 8"  (1.727 m)  Weight: 241 lb (109.317 kg)    Weights Current Weight  06/10/13 241 lb (109.317 kg)  06/04/13 237 lb 11.2 oz (107.82 kg)  06/04/13 237 lb 11.2 oz (107.82 kg)    Blood Pressure  BP Readings from Last 3 Encounters:  06/10/13 134/83  06/04/13 124/74  06/04/13 124/74     Admit date:  (Not on file) Last encounter with RMR:  Visit date not found   Allergy Demerol and Morphine and related  Current Outpatient Prescriptions  Medication Sig Dispense Refill  . ALPRAZolam (XANAX) 0.5 MG tablet Take 0.5 mg by mouth at bedtime as needed for anxiety.      Marland Kitchen aspirin 81 MG EC tablet Take 1 tablet (81 mg total) by mouth at bedtime.      Marland Kitchen atorvastatin (LIPITOR) 20 MG tablet Take 1 tablet (20 mg total) by mouth daily at 6 PM.  30 tablet  6  . carvedilol (COREG) 6.25 MG tablet Take 1 tablet (6.25 mg total) by mouth 2 (two) times daily with a meal.  60 tablet  6  . clobetasol cream (TEMOVATE) 0.05 % Apply 1-2 application topically daily.      Marland Kitchen docusate sodium (COLACE) 100 MG capsule Take 100 mg by mouth daily.      Marland Kitchen esomeprazole (NEXIUM) 20 MG capsule Take 20 mg by mouth 2 (two) times daily before a meal.      . furosemide (LASIX) 20 MG tablet Take 1 tablet (20 mg total) by mouth daily.  30 tablet  6  . glyBURIDE (DIABETA) 5 MG tablet Take 5 mg by mouth 2 (two) times daily with a meal.      . Incontinence Supplies (PREVAIL NEXUS UNDERPAD 30"X36") MISC by Does not apply route.      Marland Kitchen LANTUS 100 UNIT/ML injection Inject 35 Units into the skin at bedtime.      Marland Kitchen loratadine (CLARITIN) 10 MG tablet  Take 10 mg by mouth daily.      . metFORMIN (GLUCOPHAGE) 1000 MG tablet Take 1 tablet (1,000 mg total) by mouth 2 (two) times daily with a meal.  60 tablet  6  . montelukast (SINGULAIR) 10 MG tablet Take 10 mg by mouth daily.      . nitroGLYCERIN (NITROSTAT) 0.4 MG SL tablet Place 1 tablet (0.4 mg total) under the tongue every 5 (five) minutes as needed for chest pain.  25 tablet  12  . PARoxetine (PAXIL) 40 MG tablet Take 40 mg by mouth every morning.      . quinapril (ACCUPRIL) 20 MG tablet Take 10 mg by mouth at bedtime.       . traMADol (ULTRAM) 50 MG tablet Take 1 tablet (50 mg total) by mouth every 6 (six) hours as needed for moderate pain.  120 tablet  1   No current facility-administered medications for this visit.    Discontinued Meds:   There are no discontinued medications.  Patient Active Problem List  Diagnosis Date Noted  . HLD (hyperlipidemia) 06/04/2013  . Morbid obesity 06/04/2013  . Chronic combined systolic and diastolic CHF (congestive heart failure)   . CAD (coronary artery disease)   . Diabetes mellitus without complication   . Hypertension   . Acute systolic heart failure 06/03/2013  . Sick-euthyroid syndrome 06/02/2013  . Pulmonary edema 05/31/2013  . DM (diabetes mellitus), type 2, uncontrolled 05/31/2013  . Tachycardia 05/31/2013  . Dyspnea 05/31/2013  . HTN (hypertension) 05/31/2013  . Chest discomfort 05/31/2013  . Acute systolic CHF (congestive heart failure) 05/31/2013  . Fibromyalgia 05/31/2013    LABS    Component Value Date/Time   NA 138 06/04/2013 0528   NA 140 06/03/2013 0452   NA 140 06/01/2013 0503   K 4.0 06/04/2013 0528   K 4.1 06/03/2013 0452   K 3.9 06/01/2013 0503   CL 99 06/04/2013 0528   CL 101 06/03/2013 0452   CL 98 06/01/2013 0503   CO2 23 06/04/2013 0528   CO2 28 06/03/2013 0452   CO2 28 06/01/2013 0503   GLUCOSE 167* 06/04/2013 0528   GLUCOSE 246* 06/03/2013 0452   GLUCOSE 257* 06/01/2013 0503   BUN 12 06/04/2013 0528   BUN 11 06/03/2013 0452    BUN 11 06/01/2013 0503   CREATININE 0.45* 06/04/2013 0528   CREATININE 0.42* 06/03/2013 2027   CREATININE 0.39* 06/03/2013 0452   CALCIUM 9.6 06/04/2013 0528   CALCIUM 9.8 06/03/2013 0452   CALCIUM 9.7 06/01/2013 0503   GFRNONAA >90 06/04/2013 0528   GFRNONAA >90 06/03/2013 2027   GFRNONAA >90 06/03/2013 0452   GFRAA >90 06/04/2013 0528   GFRAA >90 06/03/2013 2027   GFRAA >90 06/03/2013 0452   CMP     Component Value Date/Time   NA 138 06/04/2013 0528   K 4.0 06/04/2013 0528   CL 99 06/04/2013 0528   CO2 23 06/04/2013 0528   GLUCOSE 167* 06/04/2013 0528   BUN 12 06/04/2013 0528   CREATININE 0.45* 06/04/2013 0528   CALCIUM 9.6 06/04/2013 0528   PROT 7.1 08/20/2009 1755   ALBUMIN 3.9 08/20/2009 1755   AST 54* 08/20/2009 1755   ALT 57* 08/20/2009 1755   ALKPHOS 80 08/20/2009 1755   BILITOT 0.4 08/20/2009 1755   GFRNONAA >90 06/04/2013 0528   GFRAA >90 06/04/2013 0528       Component Value Date/Time   WBC 9.4 06/03/2013 2027   WBC 9.0 05/31/2013 0123   WBC 9.8 08/20/2009 1755   HGB 14.5 06/03/2013 2027   HGB 14.1 05/31/2013 0123   HGB 13.6 08/20/2009 1755   HCT 42.1 06/03/2013 2027   HCT 41.3 05/31/2013 0123   HCT 40.8 08/20/2009 1755   MCV 84.2 06/03/2013 2027   MCV 83.9 05/31/2013 0123   MCV 84.0 08/20/2009 1755    Lipid Panel     Component Value Date/Time   CHOL 207* 06/01/2013 0503   TRIG 88 06/01/2013 0503   HDL 51 06/01/2013 0503   CHOLHDL 4.1 06/01/2013 0503   VLDL 18 06/01/2013 0503   LDLCALC 138* 06/01/2013 0503    ABG    Component Value Date/Time   PHART 7.296* 06/03/2013 1635   PCO2ART 53.9* 06/03/2013 1635   PO2ART 75.0* 06/03/2013 1635   HCO3 24.5* 06/03/2013 1652   TCO2 26 06/03/2013 1652   ACIDBASEDEF 3.0* 06/03/2013 1652   O2SAT 68.0 06/03/2013 1652     Lab Results  Component Value Date   TSH 8.360* 05/31/2013   BNP (last 3 results)  Recent Labs  05/31/13 0123  PROBNP 445.4*   Cardiac Panel (last 3 results) No results found for this basename: CKTOTAL, CKMB, TROPONINI, RELINDX,  in the last 72 hours   Iron/TIBC/Ferritin No results found for this basename: iron, tibc, ferritin     EKG Orders placed during the hospital encounter of 05/31/13  . ED EKG  . EKG 12-LEAD  . EKG 12-LEAD  . EKG 12-LEAD  . EKG 12-LEAD     Prior Assessment and Plan Problem List as of 06/10/2013     Cardiovascular and Mediastinum   Chronic combined systolic and diastolic CHF (congestive heart failure)   HTN (hypertension)   Acute systolic CHF (congestive heart failure)   Acute systolic heart failure   CAD (coronary artery disease)   Hypertension     Respiratory   Pulmonary edema     Endocrine   DM (diabetes mellitus), type 2, uncontrolled   Sick-euthyroid syndrome   Diabetes mellitus without complication     Musculoskeletal and Integument   Fibromyalgia     Other   Tachycardia   Dyspnea   Chest discomfort   HLD (hyperlipidemia)   Morbid obesity       Imaging: Ct Angio Chest Pe W/cm &/or Wo Cm  05/31/2013   CLINICAL DATA:  Shortness of breath for several hours. Chest heaviness. Cough.  EXAM: CT ANGIOGRAPHY CHEST WITH CONTRAST  TECHNIQUE: Multidetector CT imaging of the chest was performed using the standard protocol during bolus administration of intravenous contrast. Multiplanar CT image reconstructions and MIPs were obtained to evaluate the vascular anatomy.  CONTRAST:  100mL OMNIPAQUE IOHEXOL 350 MG/ML SOLN  COMPARISON:  Chest radiograph performed earlier today at 1:29 a.m.  FINDINGS: There is no evidence of pulmonary embolus.  Trace bilateral pleural effusions are noted. Diffuse interstitial prominence is seen, with patchy bilateral airspace opacities, compatible with mild pulmonary edema. There is no evidence of pneumothorax. No masses are identified; no abnormal focal contrast enhancement is seen.  The mediastinum is unremarkable in appearance. No mediastinal lymphadenopathy is seen. No pericardial effusion is identified. The great vessels are grossly unremarkable in appearance. No axillary  lymphadenopathy is seen. The visualized portions of the thyroid gland are unremarkable in appearance.  The visualized portions of the liver and spleen are unremarkable.  No acute osseous abnormalities are seen.  Review of the MIP images confirms the above findings.  IMPRESSION: 1. No evidence of pulmonary embolus. 2. Trace bilateral pleural effusions noted. Diffuse interstitial prominence seen, with patchy bilateral airspace opacities, compatible with mild pulmonary edema.   Electronically Signed   By: Roanna RaiderJeffery  Chang M.D.   On: 05/31/2013 04:31   Dg Chest Portable 1 View  05/31/2013   CLINICAL DATA:  Shortness of breath and chest pain.  EXAM: PORTABLE CHEST - 1 VIEW  COMPARISON:  None.  FINDINGS: The lungs are well-aerated. Vascular congestion is noted, with increased interstitial markings, raising concern for mild pulmonary edema. No pleural effusion or pneumothorax is seen. The lung apices are not well assessed due to overlying osseous structures.  The cardiomediastinal silhouette is borderline normal in size. No acute osseous abnormalities are seen.  IMPRESSION: Vascular congestion, with increased interstitial markings, raising concern for mild pulmonary edema.   Electronically Signed   By: Roanna RaiderJeffery  Chang M.D.   On: 05/31/2013 02:12

## 2013-06-10 NOTE — Assessment & Plan Note (Signed)
The patient will continue with risk modification, to include statin therapy aspirin therapy and she has been advised on weight loss and increasing exercise slowly.

## 2013-06-10 NOTE — Assessment & Plan Note (Signed)
Estimated blood pressure is slightly elevated for someone with her current systolic dysfunction. She will continue on ACE inhibitor 10 mg at bedtime, no other at this time. We will reassess her in 2 weeks. I have asked her to increase her exercise regimen by walking and 15 minutes daily, and a normal speed.

## 2013-06-11 ENCOUNTER — Telehealth: Payer: Self-pay | Admitting: *Deleted

## 2013-06-11 NOTE — Telephone Encounter (Signed)
She may go back to work at [employer] duty, no exertional activity other than walking. No heavy lifting.

## 2013-06-11 NOTE — Telephone Encounter (Signed)
Made pt aware that she could go back to work on light duty.

## 2013-06-11 NOTE — Telephone Encounter (Signed)
Pt was seen yesterday and would like to know when she is suppose to go back to work.

## 2013-06-11 NOTE — Telephone Encounter (Signed)
Is there any reason this pt can't go back to work?

## 2013-06-15 ENCOUNTER — Ambulatory Visit: Payer: Federal, State, Local not specified - PPO | Admitting: Adult Health
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
## 2013-06-25 ENCOUNTER — Ambulatory Visit (INDEPENDENT_AMBULATORY_CARE_PROVIDER_SITE_OTHER): Payer: Federal, State, Local not specified - PPO | Admitting: Adult Health

## 2013-06-25 ENCOUNTER — Encounter: Payer: Self-pay | Admitting: Adult Health

## 2013-06-25 VITALS — BP 133/56 | HR 88 | Ht 68.0 in | Wt 243.0 lb

## 2013-06-25 DIAGNOSIS — I10 Essential (primary) hypertension: Secondary | ICD-10-CM

## 2013-06-25 DIAGNOSIS — I5042 Chronic combined systolic (congestive) and diastolic (congestive) heart failure: Secondary | ICD-10-CM

## 2013-06-25 DIAGNOSIS — I509 Heart failure, unspecified: Secondary | ICD-10-CM

## 2013-06-25 MED ORDER — CARVEDILOL 25 MG PO TABS: 25.0000 mg | ORAL_TABLET | Freq: Two times a day (BID) | ORAL | Status: DC

## 2013-06-25 NOTE — Patient Instructions (Signed)
Your physician recommends that you schedule a follow-up appointment in: 1 month with Dr Wyline Mood  Your physician has recommended you make the following change in your medication:   Increase  Coreg 25 mg twice a day.

## 2013-06-25 NOTE — Assessment & Plan Note (Signed)
Blood pressure is controlled but not optimal for systolic function at this point. Will increase the carvedilol for optimal medical management.  She will need to be placed on low dose ACE on next visit.She is staying away from salt.

## 2013-06-25 NOTE — Progress Notes (Deleted)
Name: Vanessa Silva    DOB: 03/29/1960  Age: 53 y.o.  MR#: [id number]       PCP:  Colette RibasGOLDING, JOHN CABOT, MD      Insurance: Payor: [employer] / Plan: BCBS/[employer] PPO / Product Type: *No Product type* /   CC:    Chief Complaint  Patient presents with  . Congestive Heart Failure    Systolic    VS Filed Vitals:   06/25/13 1603  BP: 133/56  Pulse: 88  Height: 5\' 8"  (1.727 m)  Weight: 243 lb (110.224 kg)    Weights Current Weight  06/25/13 243 lb (110.224 kg)  06/10/13 241 lb (109.317 kg)  06/04/13 237 lb 11.2 oz (107.82 kg)    Blood Pressure  BP Readings from Last 3 Encounters:  06/25/13 133/56  06/10/13 134/83  06/04/13 124/74     Admit date:  (Not on file) Last encounter with RMR:  06/10/2013   Allergy Demerol and Morphine and related  Current Outpatient Prescriptions  Medication Sig Dispense Refill  . ALPRAZolam (XANAX) 0.5 MG tablet Take 0.5 mg by mouth at bedtime as needed for anxiety.      Marland Kitchen. amitriptyline (ELAVIL) 50 MG tablet       . aspirin 81 MG EC tablet Take 81 mg by mouth 2 (two) times daily.      . carvedilol (COREG) 12.5 MG tablet Take 1 tablet (12.5 mg total) by mouth 2 (two) times daily with a meal.  180 tablet  3  . clobetasol cream (TEMOVATE) 0.05 % Apply 1-2 application topically daily.      Marland Kitchen. docusate sodium (COLACE) 100 MG capsule Take 100 mg by mouth daily.      Marland Kitchen. esomeprazole (NEXIUM) 20 MG capsule Take 20 mg by mouth 2 (two) times daily before a meal.      . FREESTYLE LITE test strip       . furosemide (LASIX) 20 MG tablet Take 1 tablet (20 mg total) by mouth daily.  30 tablet  6  . glyBURIDE (DIABETA) 5 MG tablet Take 5 mg by mouth 2 (two) times daily with a meal.      . Incontinence Supplies (PREVAIL NEXUS UNDERPAD 30"X36") MISC by Does not apply route.      . Insulin Syringe-Needle U-100 (INSULIN SYRINGE .5CC/31GX5/16") 31G X 5/16" 0.5 ML MISC       . Lancets (FREESTYLE) lancets       . LANTUS 100 UNIT/ML injection Inject 35  Units into the skin at bedtime.      Marland Kitchen. loratadine (CLARITIN) 10 MG tablet Take 10 mg by mouth daily.      . metFORMIN (GLUCOPHAGE) 1000 MG tablet Take 1 tablet (1,000 mg total) by mouth 2 (two) times daily with a meal.  60 tablet  6  . montelukast (SINGULAIR) 10 MG tablet Take 10 mg by mouth daily.      . naproxen (NAPROSYN) 500 MG tablet       . nitroGLYCERIN (NITROSTAT) 0.4 MG SL tablet Place 1 tablet (0.4 mg total) under the tongue every 5 (five) minutes as needed for chest pain.  25 tablet  12  . PARoxetine (PAXIL) 40 MG tablet Take 40 mg by mouth every morning.      . pravastatin (PRAVACHOL) 20 MG tablet       . quinapril (ACCUPRIL) 20 MG tablet Take 10 mg by mouth at bedtime.       . traMADol (ULTRAM) 50 MG tablet Take  1 tablet (50 mg total) by mouth every 6 (six) hours as needed for moderate pain.  120 tablet  1   No current facility-administered medications for this visit.    Discontinued Meds:    Medications Discontinued During This Encounter  Medication Reason  . aspirin 81 MG EC tablet   . atorvastatin (LIPITOR) 20 MG tablet Error    Patient Active Problem List   Diagnosis Date Noted  . HLD (hyperlipidemia) 06/04/2013  . Morbid obesity 06/04/2013  . Chronic combined systolic and diastolic CHF (congestive heart failure)   . CAD (coronary artery disease)   . Diabetes mellitus without complication   . Hypertension   . Acute systolic heart failure 06/03/2013  . Sick-euthyroid syndrome 06/02/2013  . Pulmonary edema 05/31/2013  . DM (diabetes mellitus), type 2, uncontrolled 05/31/2013  . Tachycardia 05/31/2013  . Dyspnea 05/31/2013  . HTN (hypertension) 05/31/2013  . Chest discomfort 05/31/2013  . Acute systolic CHF (congestive heart failure) 05/31/2013  . Fibromyalgia 05/31/2013    LABS    Component Value Date/Time   NA 138 06/04/2013 0528   NA 140 06/03/2013 0452   NA 140 06/01/2013 0503   K 4.0 06/04/2013 0528   K 4.1 06/03/2013 0452   K 3.9 06/01/2013 0503   CL 99  06/04/2013 0528   CL 101 06/03/2013 0452   CL 98 06/01/2013 0503   CO2 23 06/04/2013 0528   CO2 28 06/03/2013 0452   CO2 28 06/01/2013 0503   GLUCOSE 167* 06/04/2013 0528   GLUCOSE 246* 06/03/2013 0452   GLUCOSE 257* 06/01/2013 0503   BUN 12 06/04/2013 0528   BUN 11 06/03/2013 0452   BUN 11 06/01/2013 0503   CREATININE 0.45* 06/04/2013 0528   CREATININE 0.42* 06/03/2013 2027   CREATININE 0.39* 06/03/2013 0452   CALCIUM 9.6 06/04/2013 0528   CALCIUM 9.8 06/03/2013 0452   CALCIUM 9.7 06/01/2013 0503   GFRNONAA >90 06/04/2013 0528   GFRNONAA >90 06/03/2013 2027   GFRNONAA >90 06/03/2013 0452   GFRAA >90 06/04/2013 0528   GFRAA >90 06/03/2013 2027   GFRAA >90 06/03/2013 0452   CMP     Component Value Date/Time   NA 138 06/04/2013 0528   K 4.0 06/04/2013 0528   CL 99 06/04/2013 0528   CO2 23 06/04/2013 0528   GLUCOSE 167* 06/04/2013 0528   BUN 12 06/04/2013 0528   CREATININE 0.45* 06/04/2013 0528   CALCIUM 9.6 06/04/2013 0528   PROT 7.1 08/20/2009 1755   ALBUMIN 3.9 08/20/2009 1755   AST 54* 08/20/2009 1755   ALT 57* 08/20/2009 1755   ALKPHOS 80 08/20/2009 1755   BILITOT 0.4 08/20/2009 1755   GFRNONAA >90 06/04/2013 0528   GFRAA >90 06/04/2013 0528       Component Value Date/Time   WBC 9.4 06/03/2013 2027   WBC 9.0 05/31/2013 0123   WBC 9.8 08/20/2009 1755   HGB 14.5 06/03/2013 2027   HGB 14.1 05/31/2013 0123   HGB 13.6 08/20/2009 1755   HCT 42.1 06/03/2013 2027   HCT 41.3 05/31/2013 0123   HCT 40.8 08/20/2009 1755   MCV 84.2 06/03/2013 2027   MCV 83.9 05/31/2013 0123   MCV 84.0 08/20/2009 1755    Lipid Panel     Component Value Date/Time   CHOL 207* 06/01/2013 0503   TRIG 88 06/01/2013 0503   HDL 51 06/01/2013 0503   CHOLHDL 4.1 06/01/2013 0503   VLDL 18 06/01/2013 0503   LDLCALC 138* 06/01/2013 0503  ABG    Component Value Date/Time   PHART 7.296* 06/03/2013 1635   PCO2ART 53.9* 06/03/2013 1635   PO2ART 75.0* 06/03/2013 1635   HCO3 24.5* 06/03/2013 1652   TCO2 26 06/03/2013 1652   ACIDBASEDEF 3.0* 06/03/2013 1652   O2SAT 68.0 06/03/2013 1652     Lab  Results  Component Value Date   TSH 8.360* 05/31/2013   BNP (last 3 results)  Recent Labs  05/31/13 0123  PROBNP 445.4*   Cardiac Panel (last 3 results) No results found for this basename: CKTOTAL, CKMB, TROPONINI, RELINDX,  in the last 72 hours  Iron/TIBC/Ferritin No results found for this basename: iron, tibc, ferritin     EKG Orders placed during the hospital encounter of 05/31/13  . ED EKG  . EKG 12-LEAD  . EKG 12-LEAD  . EKG 12-LEAD  . EKG 12-LEAD     Prior Assessment and Plan Problem List as of 06/25/2013     Cardiovascular and Mediastinum   Chronic combined systolic and diastolic CHF (congestive heart failure)   Last Assessment & Plan   06/10/2013 Office Visit Written 06/10/2013  4:35 PM by Jodelle Gross, NP     The patient is on appropriate medications, blood pressures not well controlled for her current LV function. I will increase her carvedilol 6.25 mg twice a day to 12.5 mg twice a day. She will followup with [place] in a couple weeks for reevaluation of her current status and then be seen approximately once a month and thereafter. She is to continue to on a low-sodium diet. No other medications are changed. She is to call us for any fluid retention. The majority of her fluid is accumulating in her abdomen and does not appear in her lower extremities according to her subjective response to fluid retention symptoms.    HTN (hypertension)   Last Assessment & Plan   06/10/2013 Office Visit Written 06/10/2013  4:36 PM by Jodelle Gross, NP     Estimated blood pressure is slightly elevated for someone with her current systolic dysfunction. She will continue on ACE inhibitor 10 mg at bedtime, no other at this time. We will reassess her in 2 weeks. I have asked her to increase her exercise regimen by walking and 15 minutes daily, and a normal speed.     Acute systolic CHF (congestive heart failure)   Acute systolic heart failure   CAD (coronary artery disease)   Last  Assessment & Plan   06/10/2013 Office Visit Written 06/10/2013  4:37 PM by Jodelle Gross, NP     The patient will continue with risk modification, to include statin therapy aspirin therapy and she has been advised on weight loss and increasing exercise slowly.    Hypertension     Respiratory   Pulmonary edema     Endocrine   DM (diabetes mellitus), type 2, uncontrolled   Sick-euthyroid syndrome   Diabetes mellitus without complication     Musculoskeletal and Integument   Fibromyalgia     Other   Tachycardia   Dyspnea   Chest discomfort   HLD (hyperlipidemia)   Morbid obesity       Imaging: Ct Angio Chest Pe W/cm &/or Wo Cm  05/31/2013   CLINICAL DATA:  Shortness of breath for several hours. Chest heaviness. Cough.  EXAM: CT ANGIOGRAPHY CHEST WITH CONTRAST  TECHNIQUE: Multidetector CT imaging of the chest was performed using the standard protocol during bolus administration of intravenous contrast. Multiplanar CT image reconstructions and MIPs  were obtained to evaluate the vascular anatomy.  CONTRAST:  OMNIPAQUE IOHEXOL 350 MG/ML SOLN  COMPARISON:  Chest radiograph performed earlier today at 1:29 a.m.  FINDINGS: There is no evidence of pulmonary embolus.  Trace bilateral pleural effusions are noted. Diffuse interstitial prominence is seen, with patchy bilateral airspace opacities, compatible with mild pulmonary edema. There is no evidence of pneumothorax. No masses are identified; no abnormal focal contrast enhancement is seen.  The mediastinum is unremarkable in appearance. No mediastinal lymphadenopathy is seen. No pericardial effusion is identified. The great vessels are grossly unremarkable in appearance. No axillary lymphadenopathy is seen. The visualized portions of the thyroid gland are unremarkable in appearance.  The visualized portions of the liver and spleen are unremarkable.  No acute osseous abnormalities are seen.  Review of the MIP images confirms the above findings.   IMPRESSION: 1. No evidence of pulmonary embolus. 2. Trace bilateral pleural effusions noted. Diffuse interstitial prominence seen, with patchy bilateral airspace opacities, compatible with mild pulmonary edema.   Electronically Signed   By: Roanna Raider M.D.   On: 05/31/2013 04:31   Dg Chest Portable 1 View  05/31/2013   CLINICAL DATA:  Shortness of breath and chest pain.  EXAM: PORTABLE CHEST - 1 VIEW  COMPARISON:  None.  FINDINGS: The lungs are well-aerated. Vascular congestion is noted, with increased interstitial markings, raising concern for mild pulmonary edema. No pleural effusion or pneumothorax is seen. The lung apices are not well assessed due to overlying osseous structures.  The cardiomediastinal silhouette is borderline normal in size. No acute osseous abnormalities are seen.  IMPRESSION: Vascular congestion, with increased interstitial markings, raising concern for mild pulmonary edema.   Electronically Signed   By: Roanna Raider M.D.   On: 05/31/2013 02:12

## 2013-06-25 NOTE — Assessment & Plan Note (Signed)
She is doing well and stable I will maximize her coreg dose for optimal medical management. She will see Dr.Branch in one month. She will need to have follow up echo in a couple of months. She is to call us if she becomes symptomatic

## 2013-08-03 ENCOUNTER — Ambulatory Visit: Payer: Federal, State, Local not specified - PPO | Admitting: Cardiology

## 2013-08-31 ENCOUNTER — Other Ambulatory Visit: Payer: Self-pay | Admitting: Cardiovascular Disease

## 2013-09-03 ENCOUNTER — Telehealth: Payer: Self-pay | Admitting: Adult Health

## 2013-09-03 NOTE — Telephone Encounter (Signed)
Received fax refill request  Rx # [id number]  Medication:  Tramadol 50 mg tablets Qty 120 Sig:  Take one tablet by mouth every 6 hours as needed for pain Physician:  Lyman Bishop

## 2013-12-24 ENCOUNTER — Other Ambulatory Visit (HOSPITAL_COMMUNITY): Payer: Self-pay | Admitting: Physician Assistant

## 2013-12-24 ENCOUNTER — Other Ambulatory Visit (HOSPITAL_COMMUNITY): Payer: Self-pay | Admitting: Adult Health

## 2014-01-22 ENCOUNTER — Other Ambulatory Visit: Payer: Self-pay | Admitting: Physician Assistant

## 2014-01-27 NOTE — Telephone Encounter (Signed)
Could one of you please show me the actual med name on this type of encounter? I don't know where to look, therefore, I have not been able to answer whether to refill or not.

## 2014-02-04 ENCOUNTER — Encounter (HOSPITAL_COMMUNITY): Payer: Self-pay | Admitting: Interventional Cardiology

## 2014-03-11 ENCOUNTER — Encounter (HOSPITAL_COMMUNITY): Payer: Self-pay | Admitting: Interventional Cardiology

## 2014-05-13 ENCOUNTER — Ambulatory Visit (INDEPENDENT_AMBULATORY_CARE_PROVIDER_SITE_OTHER): Payer: Federal, State, Local not specified - PPO | Admitting: Cardiology

## 2014-05-13 VITALS — BP 110/72 | HR 80 | Ht 68.0 in | Wt 237.0 lb

## 2014-05-13 DIAGNOSIS — I5022 Chronic systolic (congestive) heart failure: Secondary | ICD-10-CM

## 2014-05-13 DIAGNOSIS — I10 Essential (primary) hypertension: Secondary | ICD-10-CM
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
## 2014-05-13 NOTE — Patient Instructions (Signed)
Your physician recommends that you schedule a follow-up appointment in: 3-4 weeks   Your physician recommends that you continue on your current medications as directed. Please refer to the Current Medication list given to you today.    Your physician has requested that you have an echocardiogram. Echocardiography is a painless test that uses sound waves to create images of your heart. It provides your doctor with information about the size and shape of your heart and how well your heart's chambers and valves are working. This procedure takes approximately one hour. There are no restrictions for this procedure.    Thank you for choosing [facility] !         

## 2014-05-14 ENCOUNTER — Ambulatory Visit (HOSPITAL_COMMUNITY)
Admission: RE | Admit: 2014-05-14 | Discharge: 2014-05-14 | Disposition: A | Discharge status: Home / Self Care | Payer: Federal, State, Local not specified - PPO | Source: Ambulatory Visit | Attending: Cardiology | Admitting: Cardiology

## 2014-05-14 ENCOUNTER — Encounter: Payer: Self-pay | Admitting: Cardiology

## 2014-05-14 DIAGNOSIS — I501 Left ventricular failure: Secondary | ICD-10-CM | POA: Diagnosis present

## 2014-05-14 DIAGNOSIS — I5022 Chronic systolic (congestive) heart failure: Secondary | ICD-10-CM

## 2014-05-14 NOTE — Progress Notes (Signed)
  Echocardiogram 2D Echocardiogram has been performed.  Shaliah Wann J 05/14/2014, 10:15 AM 

## 2014-05-15 ENCOUNTER — Encounter: Payer: Self-pay | Admitting: Cardiology

## 2014-06-15 ENCOUNTER — Ambulatory Visit (INDEPENDENT_AMBULATORY_CARE_PROVIDER_SITE_OTHER): Payer: Federal, State, Local not specified - PPO | Admitting: Cardiology

## 2014-06-15 ENCOUNTER — Encounter: Payer: Self-pay | Admitting: Cardiology

## 2014-06-15 VITALS — BP 134/82 | HR 74 | Ht 68.0 in | Wt 246.0 lb

## 2014-06-15 DIAGNOSIS — I10 Essential (primary) hypertension: Secondary | ICD-10-CM

## 2014-06-15 DIAGNOSIS — I5042 Chronic combined systolic (congestive) and diastolic (congestive) heart failure: Secondary | ICD-10-CM

## 2014-06-15 DIAGNOSIS — E785 Hyperlipidemia, unspecified: Secondary | ICD-10-CM | POA: Diagnosis not present

## 2014-06-15 MED ORDER — PRAVASTATIN SODIUM 20 MG PO TABS: 20.0000 mg | ORAL_TABLET | Freq: Every evening | ORAL | Status: DC

## 2014-06-15 MED ORDER — PRAVASTATIN SODIUM 40 MG PO TABS: 40.0000 mg | ORAL_TABLET | Freq: Every evening | ORAL | Status: DC
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
## 2014-06-15 NOTE — Patient Instructions (Addendum)
Your physician wants you to follow-up in: 6 months with Dr.Branch You will receive a reminder letter in the mail two months in advance. If you don't receive a letter, please call our office to schedule the follow-up appointment.   START Pravastatin 20 mg daily with dinner    Thank you for choosing [facility] !

## 2014-08-10 ENCOUNTER — Other Ambulatory Visit: Payer: Self-pay | Admitting: Adult Health

## 2014-08-10 NOTE — Telephone Encounter (Signed)
Refill complete 

## 2014-08-24 ENCOUNTER — Other Ambulatory Visit: Payer: Self-pay | Admitting: Adult Health

## 2015-01-19 ENCOUNTER — Ambulatory Visit (HOSPITAL_COMMUNITY)
Admission: RE | Admit: 2015-01-19 | Discharge: 2015-01-19 | Disposition: A | Discharge status: Home / Self Care | Payer: Federal, State, Local not specified - PPO | Source: Ambulatory Visit | Attending: Family Medicine | Admitting: Family Medicine

## 2015-01-19 ENCOUNTER — Other Ambulatory Visit (HOSPITAL_COMMUNITY): Payer: Self-pay | Admitting: Family Medicine

## 2015-01-19 DIAGNOSIS — M25561 Pain in right knee: Secondary | ICD-10-CM | POA: Diagnosis present

## 2015-03-16 ENCOUNTER — Inpatient Hospital Stay (HOSPITAL_COMMUNITY)
Admission: EM | Admit: 2015-03-16 | Discharge: 2015-03-22 | DRG: 378 | Disposition: A | Discharge status: Home / Self Care | Payer: Federal, State, Local not specified - PPO | Attending: Family Medicine | Admitting: Family Medicine

## 2015-03-16 ENCOUNTER — Encounter (HOSPITAL_COMMUNITY): Payer: Self-pay | Admitting: Emergency Medicine

## 2015-03-16 ENCOUNTER — Emergency Department (HOSPITAL_COMMUNITY): Payer: Federal, State, Local not specified - PPO

## 2015-03-16 DIAGNOSIS — Z823 Family history of stroke: Secondary | ICD-10-CM | POA: Diagnosis not present

## 2015-03-16 DIAGNOSIS — K219 Gastro-esophageal reflux disease without esophagitis: Secondary | ICD-10-CM | POA: Diagnosis present

## 2015-03-16 DIAGNOSIS — E785 Hyperlipidemia, unspecified: Secondary | ICD-10-CM | POA: Diagnosis present

## 2015-03-16 DIAGNOSIS — R109 Unspecified abdominal pain: Secondary | ICD-10-CM | POA: Diagnosis present

## 2015-03-16 DIAGNOSIS — I429 Cardiomyopathy, unspecified: Secondary | ICD-10-CM | POA: Diagnosis present

## 2015-03-16 DIAGNOSIS — E1165 Type 2 diabetes mellitus with hyperglycemia: Secondary | ICD-10-CM | POA: Diagnosis present

## 2015-03-16 DIAGNOSIS — IMO0002 Reserved for concepts with insufficient information to code with codable children: Secondary | ICD-10-CM | POA: Diagnosis present

## 2015-03-16 DIAGNOSIS — Z8249 Family history of ischemic heart disease and other diseases of the circulatory system: Secondary | ICD-10-CM | POA: Diagnosis not present

## 2015-03-16 DIAGNOSIS — Z7982 Long term (current) use of aspirin: Secondary | ICD-10-CM | POA: Diagnosis not present

## 2015-03-16 DIAGNOSIS — R1310 Dysphagia, unspecified: Secondary | ICD-10-CM

## 2015-03-16 DIAGNOSIS — K5730 Diverticulosis of large intestine without perforation or abscess without bleeding: Secondary | ICD-10-CM | POA: Diagnosis present

## 2015-03-16 DIAGNOSIS — Z833 Family history of diabetes mellitus: Secondary | ICD-10-CM

## 2015-03-16 DIAGNOSIS — R1319 Other dysphagia: Secondary | ICD-10-CM | POA: Diagnosis present

## 2015-03-16 DIAGNOSIS — F419 Anxiety disorder, unspecified: Secondary | ICD-10-CM | POA: Diagnosis present

## 2015-03-16 DIAGNOSIS — E119 Type 2 diabetes mellitus without complications: Secondary | ICD-10-CM | POA: Diagnosis not present

## 2015-03-16 DIAGNOSIS — I2510 Atherosclerotic heart disease of native coronary artery without angina pectoris: Secondary | ICD-10-CM | POA: Diagnosis present

## 2015-03-16 DIAGNOSIS — Z791 Long term (current) use of non-steroidal anti-inflammatories (NSAID): Secondary | ICD-10-CM | POA: Diagnosis not present

## 2015-03-16 DIAGNOSIS — K922 Gastrointestinal hemorrhage, unspecified: Secondary | ICD-10-CM | POA: Diagnosis not present

## 2015-03-16 DIAGNOSIS — D62 Acute posthemorrhagic anemia: Secondary | ICD-10-CM | POA: Diagnosis present

## 2015-03-16 DIAGNOSIS — R1030 Lower abdominal pain, unspecified: Secondary | ICD-10-CM

## 2015-03-16 DIAGNOSIS — Z7984 Long term (current) use of oral hypoglycemic drugs: Secondary | ICD-10-CM

## 2015-03-16 DIAGNOSIS — K5909 Other constipation: Secondary | ICD-10-CM | POA: Diagnosis present

## 2015-03-16 DIAGNOSIS — A09 Infectious gastroenteritis and colitis, unspecified: Secondary | ICD-10-CM | POA: Diagnosis not present

## 2015-03-16 DIAGNOSIS — D649 Anemia, unspecified: Secondary | ICD-10-CM | POA: Diagnosis not present

## 2015-03-16 DIAGNOSIS — R509 Fever, unspecified: Secondary | ICD-10-CM | POA: Diagnosis not present

## 2015-03-16 DIAGNOSIS — K2270 Barrett's esophagus without dysplasia: Secondary | ICD-10-CM | POA: Diagnosis not present

## 2015-03-16 DIAGNOSIS — K625 Hemorrhage of anus and rectum: Secondary | ICD-10-CM | POA: Diagnosis present

## 2015-03-16 DIAGNOSIS — I5022 Chronic systolic (congestive) heart failure: Secondary | ICD-10-CM | POA: Diagnosis present

## 2015-03-16 DIAGNOSIS — R197 Diarrhea, unspecified: Secondary | ICD-10-CM | POA: Diagnosis present

## 2015-03-16 DIAGNOSIS — Z825 Family history of asthma and other chronic lower respiratory diseases: Secondary | ICD-10-CM | POA: Diagnosis not present

## 2015-03-16 DIAGNOSIS — Z79899 Other long term (current) drug therapy: Secondary | ICD-10-CM

## 2015-03-16 DIAGNOSIS — M797 Fibromyalgia: Secondary | ICD-10-CM | POA: Diagnosis present

## 2015-03-16 DIAGNOSIS — F329 Major depressive disorder, single episode, unspecified: Secondary | ICD-10-CM | POA: Diagnosis present

## 2015-03-16 DIAGNOSIS — K449 Diaphragmatic hernia without obstruction or gangrene: Secondary | ICD-10-CM | POA: Diagnosis present

## 2015-03-16 DIAGNOSIS — K5731 Diverticulosis of large intestine without perforation or abscess with bleeding: Secondary | ICD-10-CM

## 2015-03-16 DIAGNOSIS — I5042 Chronic combined systolic (congestive) and diastolic (congestive) heart failure: Secondary | ICD-10-CM | POA: Diagnosis present

## 2015-03-16 DIAGNOSIS — B9620 Unspecified Escherichia coli [E. coli] as the cause of diseases classified elsewhere: Secondary | ICD-10-CM | POA: Diagnosis present

## 2015-03-16 DIAGNOSIS — I110 Hypertensive heart disease with heart failure: Secondary | ICD-10-CM | POA: Diagnosis present

## 2015-03-16 DIAGNOSIS — R1314 Dysphagia, pharyngoesophageal phase: Secondary | ICD-10-CM | POA: Diagnosis not present

## 2015-03-16 DIAGNOSIS — I10 Essential (primary) hypertension: Secondary | ICD-10-CM | POA: Diagnosis present

## 2015-03-16 DIAGNOSIS — K921 Melena: Principal | ICD-10-CM | POA: Diagnosis present

## 2015-03-16 LAB — COMPREHENSIVE METABOLIC PANEL
ALK PHOS: 62 U/L (ref 38–126)
ALT: 15 U/L (ref 14–54)
ANION GAP: 6 (ref 5–15)
AST: 15 U/L (ref 15–41)
Albumin: 3.5 g/dL (ref 3.5–5.0)
BILIRUBIN TOTAL: 0.2 mg/dL — AB (ref 0.3–1.2)
BUN: 17 mg/dL (ref 6–20)
CALCIUM: 8.6 mg/dL — AB (ref 8.9–10.3)
CO2: 27 mmol/L (ref 22–32)
CREATININE: 0.68 mg/dL (ref 0.44–1.00)
Chloride: 101 mmol/L (ref 101–111)
Glucose, Bld: 314 mg/dL — ABNORMAL HIGH (ref 65–99)
Potassium: 4.7 mmol/L (ref 3.5–5.1)
SODIUM: 134 mmol/L — AB (ref 135–145)
TOTAL PROTEIN: 6.3 g/dL — AB (ref 6.5–8.1)

## 2015-03-16 LAB — CBC WITH DIFFERENTIAL/PLATELET
Basophils Absolute: 0 10*3/uL (ref 0.0–0.1)
Basophils Relative: 0 %
Eosinophils Absolute: 0.2 10*3/uL (ref 0.0–0.7)
Eosinophils Relative: 2 %
HCT: 35.9 % — ABNORMAL LOW (ref 36.0–46.0)
HEMOGLOBIN: 11.9 g/dL — AB (ref 12.0–15.0)
LYMPHS ABS: 3.4 10*3/uL (ref 0.7–4.0)
LYMPHS PCT: 27 %
MCH: 28.7 pg (ref 26.0–34.0)
MCHC: 33.1 g/dL (ref 30.0–36.0)
MCV: 86.5 fL (ref 78.0–100.0)
MONOS PCT: 4 %
Monocytes Absolute: 0.6 10*3/uL (ref 0.1–1.0)
NEUTROS PCT: 67 %
Neutro Abs: 8.3 10*3/uL — ABNORMAL HIGH (ref 1.7–7.7)
Platelets: 316 10*3/uL (ref 150–400)
RBC: 4.15 MIL/uL (ref 3.87–5.11)
RDW: 12.7 % (ref 11.5–15.5)
WBC: 12.5 10*3/uL — ABNORMAL HIGH (ref 4.0–10.5)

## 2015-03-16 LAB — GLUCOSE, CAPILLARY: Glucose-Capillary: 104 mg/dL — ABNORMAL HIGH (ref 65–99)

## 2015-03-16 LAB — TROPONIN I: Troponin I: <0.03 ng/mL (ref <0.031)

## 2015-03-16 LAB — LACTIC ACID, PLASMA: LACTIC ACID, VENOUS: 1.4 mmol/L (ref 0.5–2.0)

## 2015-03-16 MED ORDER — FUROSEMIDE 20 MG PO TABS
20.0000 mg | ORAL_TABLET | Freq: Every day | ORAL | Status: DC
Start: 2015-03-17 — End: 2015-03-22
  Administered 2015-03-17 – 2015-03-22 (×6): 20 mg via ORAL
  Filled 2015-03-16 (×6): qty 1

## 2015-03-16 MED ORDER — CANAGLIFLOZIN 300 MG PO TABS
300.0000 mg | ORAL_TABLET | Freq: Every day | ORAL | Status: DC
  Filled 2015-03-16 (×2): qty 300

## 2015-03-16 MED ORDER — FENTANYL CITRATE (PF) 100 MCG/2ML IJ SOLN
12.5000 ug | INTRAMUSCULAR | Status: DC | PRN
  Filled 2015-03-16: qty 2

## 2015-03-16 MED ORDER — CARVEDILOL 12.5 MG PO TABS
25.0000 mg | ORAL_TABLET | Freq: Two times a day (BID) | ORAL | Status: DC
  Administered 2015-03-17 – 2015-03-22 (×10): 25 mg via ORAL
  Filled 2015-03-16 (×12): qty 2

## 2015-03-16 MED ORDER — ACETAMINOPHEN 650 MG RE SUPP: 650.0000 mg | Freq: Four times a day (QID) | RECTAL | Status: DC | PRN

## 2015-03-16 MED ORDER — SODIUM CHLORIDE 0.9 % IJ SOLN
3.0000 mL | Freq: Two times a day (BID) | INTRAMUSCULAR | Status: DC
  Administered 2015-03-16 – 2015-03-22 (×11): 3 mL via INTRAVENOUS

## 2015-03-16 MED ORDER — ACETAMINOPHEN 325 MG PO TABS
650.0000 mg | ORAL_TABLET | Freq: Four times a day (QID) | ORAL | Status: DC | PRN
  Administered 2015-03-19 – 2015-03-20 (×3): 650 mg via ORAL
  Filled 2015-03-16 (×3): qty 2

## 2015-03-16 MED ORDER — DULOXETINE HCL 30 MG PO CPEP
30.0000 mg | ORAL_CAPSULE | Freq: Every day | ORAL | Status: DC
  Administered 2015-03-16 – 2015-03-21 (×6): 30 mg via ORAL
  Filled 2015-03-16 (×6): qty 1

## 2015-03-16 MED ORDER — QUINAPRIL HCL 10 MG PO TABS: 20.0000 mg | ORAL_TABLET | Freq: Every day | ORAL | Status: DC

## 2015-03-16 MED ORDER — PAROXETINE HCL 20 MG PO TABS
30.0000 mg | ORAL_TABLET | Freq: Every day | ORAL | Status: DC
  Administered 2015-03-16 – 2015-03-22 (×7): 30 mg via ORAL
  Filled 2015-03-16 (×7): qty 2

## 2015-03-16 MED ORDER — MONTELUKAST SODIUM 10 MG PO TABS
10.0000 mg | ORAL_TABLET | Freq: Every day | ORAL | Status: DC
  Administered 2015-03-16 – 2015-03-22 (×7): 10 mg via ORAL
  Filled 2015-03-16 (×7): qty 1

## 2015-03-16 MED ORDER — METRONIDAZOLE IN NACL 5-0.79 MG/ML-% IV SOLN
500.0000 mg | Freq: Three times a day (TID) | INTRAVENOUS | Status: DC
  Administered 2015-03-16 – 2015-03-17 (×3): 500 mg via INTRAVENOUS
  Filled 2015-03-16 (×3): qty 100

## 2015-03-16 MED ORDER — ONDANSETRON HCL 4 MG PO TABS
4.0000 mg | ORAL_TABLET | Freq: Four times a day (QID) | ORAL | Status: DC | PRN
Start: 2015-03-16 — End: 2015-03-22
  Administered 2015-03-22: 4 mg via ORAL
  Filled 2015-03-16: qty 1

## 2015-03-16 MED ORDER — DIATRIZOATE MEGLUMINE & SODIUM 66-10 % PO SOLN
ORAL | Status: AC
  Administered 2015-03-16: 30 mL
  Filled 2015-03-16: qty 30

## 2015-03-16 MED ORDER — CIPROFLOXACIN IN D5W 400 MG/200ML IV SOLN
400.0000 mg | Freq: Two times a day (BID) | INTRAVENOUS | Status: DC
  Administered 2015-03-16 – 2015-03-17 (×2): 400 mg via INTRAVENOUS
  Filled 2015-03-16 (×2): qty 200

## 2015-03-16 MED ORDER — NITROGLYCERIN 0.4 MG SL SUBL: 0.4000 mg | SUBLINGUAL_TABLET | SUBLINGUAL | Status: DC | PRN

## 2015-03-16 MED ORDER — SODIUM CHLORIDE 0.9 % IV BOLUS (SEPSIS)
1000.0000 mL | Freq: Once | INTRAVENOUS | Status: AC
  Administered 2015-03-16: 1000 mL via INTRAVENOUS

## 2015-03-16 MED ORDER — LISINOPRIL 10 MG PO TABS
20.0000 mg | ORAL_TABLET | Freq: Every day | ORAL | Status: DC
  Administered 2015-03-17 – 2015-03-22 (×5): 20 mg via ORAL
  Filled 2015-03-16 (×7): qty 2

## 2015-03-16 MED ORDER — ALPRAZOLAM 0.25 MG PO TABS
0.2500 mg | ORAL_TABLET | Freq: Every evening | ORAL | Status: DC | PRN
  Administered 2015-03-16 – 2015-03-21 (×6): 0.25 mg via ORAL
  Filled 2015-03-16 (×6): qty 1

## 2015-03-16 MED ORDER — LORATADINE 10 MG PO TABS
10.0000 mg | ORAL_TABLET | Freq: Every day | ORAL | Status: DC
  Administered 2015-03-17 – 2015-03-22 (×6): 10 mg via ORAL
  Filled 2015-03-16 (×7): qty 1

## 2015-03-16 MED ORDER — ONDANSETRON HCL 4 MG/2ML IJ SOLN
4.0000 mg | Freq: Four times a day (QID) | INTRAMUSCULAR | Status: DC | PRN
  Administered 2015-03-17 – 2015-03-18 (×2): 4 mg via INTRAVENOUS
  Filled 2015-03-16 (×2): qty 2

## 2015-03-16 MED ORDER — SODIUM CHLORIDE 0.9 % IV SOLN
INTRAVENOUS | Status: DC
  Administered 2015-03-16 – 2015-03-17 (×2): via INTRAVENOUS

## 2015-03-16 MED ORDER — INSULIN ASPART 100 UNIT/ML ~~LOC~~ SOLN
0.0000 [IU] | SUBCUTANEOUS | Status: DC
  Administered 2015-03-17: 3 [IU] via SUBCUTANEOUS

## 2015-03-16 MED ORDER — IOHEXOL 300 MG/ML  SOLN
100.0000 mL | Freq: Once | INTRAMUSCULAR | Status: AC | PRN
  Administered 2015-03-16: 100 mL via INTRAVENOUS
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
## 2015-03-16 NOTE — ED Notes (Addendum)
Having diarrhea for 14 hour and notice blood in stool.  CBG 346 and pt did not take diabetic meds and ate oatmeal cookie X2 and regular Spite prior to EMS coming.  Pt says glucose 257 and she thought she was going to pass out that, that was why she ate cookies and drank regular Spite.
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
## 2015-03-17 ENCOUNTER — Encounter (HOSPITAL_COMMUNITY): Payer: Self-pay | Admitting: Gastroenterology

## 2015-03-17 DIAGNOSIS — D62 Acute posthemorrhagic anemia: Secondary | ICD-10-CM

## 2015-03-17 DIAGNOSIS — R1310 Dysphagia, unspecified: Secondary | ICD-10-CM

## 2015-03-17 DIAGNOSIS — A09 Infectious gastroenteritis and colitis, unspecified: Secondary | ICD-10-CM

## 2015-03-17 DIAGNOSIS — R1314 Dysphagia, pharyngoesophageal phase: Secondary | ICD-10-CM

## 2015-03-17 DIAGNOSIS — R1319 Other dysphagia: Secondary | ICD-10-CM

## 2015-03-17 DIAGNOSIS — K2270 Barrett's esophagus without dysplasia: Secondary | ICD-10-CM

## 2015-03-17 LAB — GLUCOSE, CAPILLARY
GLUCOSE-CAPILLARY: 117 mg/dL — AB (ref 65–99)
GLUCOSE-CAPILLARY: 176 mg/dL — AB (ref 65–99)
GLUCOSE-CAPILLARY: 147 mg/dL — AB (ref 65–99)
GLUCOSE-CAPILLARY: 131 mg/dL — AB (ref 65–99)
Glucose-Capillary: 114 mg/dL — ABNORMAL HIGH (ref 65–99)
Glucose-Capillary: 133 mg/dL — ABNORMAL HIGH (ref 65–99)

## 2015-03-17 LAB — CBC
HCT: 28.3 % — ABNORMAL LOW (ref 36.0–46.0)
HCT: 29.7 % — ABNORMAL LOW (ref 36.0–46.0)
HCT: 31.5 % — ABNORMAL LOW (ref 36.0–46.0)
HEMATOCRIT: 27.9 % — AB (ref 36.0–46.0)
HEMOGLOBIN: 10 g/dL — AB (ref 12.0–15.0)
HEMOGLOBIN: 9.4 g/dL — AB (ref 12.0–15.0)
HEMOGLOBIN: 9.6 g/dL — AB (ref 12.0–15.0)
Hemoglobin: 10.6 g/dL — ABNORMAL LOW (ref 12.0–15.0)
MCH: 28.9 pg (ref 26.0–34.0)
MCH: 29 pg (ref 26.0–34.0)
MCH: 29.2 pg (ref 26.0–34.0)
MCH: 29.3 pg (ref 26.0–34.0)
MCHC: 33.7 g/dL (ref 30.0–36.0)
MCHC: 33.7 g/dL (ref 30.0–36.0)
MCHC: 33.7 g/dL (ref 30.0–36.0)
MCHC: 33.9 g/dL (ref 30.0–36.0)
MCV: 85.8 fL (ref 78.0–100.0)
MCV: 86.1 fL (ref 78.0–100.0)
MCV: 86.3 fL (ref 78.0–100.0)
MCV: 86.6 fL (ref 78.0–100.0)
PLATELETS: 264 10*3/uL (ref 150–400)
Platelets: 257 10*3/uL (ref 150–400)
Platelets: 272 10*3/uL (ref 150–400)
Platelets: 323 10*3/uL (ref 150–400)
RBC: 3.22 MIL/uL — ABNORMAL LOW (ref 3.87–5.11)
RBC: 3.28 MIL/uL — AB (ref 3.87–5.11)
RBC: 3.45 MIL/uL — AB (ref 3.87–5.11)
RBC: 3.67 MIL/uL — ABNORMAL LOW (ref 3.87–5.11)
RDW: 12.7 % (ref 11.5–15.5)
RDW: 12.8 % (ref 11.5–15.5)
RDW: 12.9 % (ref 11.5–15.5)
RDW: 12.6 % (ref 11.5–15.5)
WBC: 11.2 10*3/uL — ABNORMAL HIGH (ref 4.0–10.5)
WBC: 12.2 10*3/uL — AB (ref 4.0–10.5)
WBC: 12.6 10*3/uL — AB (ref 4.0–10.5)
WBC: 16 10*3/uL — ABNORMAL HIGH (ref 4.0–10.5)

## 2015-03-17 LAB — TROPONIN I

## 2015-03-17 MED ORDER — PEG 3350-KCL-NA BICARB-NACL 420 G PO SOLR
4000.0000 mL | Freq: Once | ORAL | Status: AC
  Administered 2015-03-17: 4000 mL via ORAL
  Filled 2015-03-17: qty 4000

## 2015-03-17 MED ORDER — PANTOPRAZOLE SODIUM 40 MG IV SOLR
40.0000 mg | INTRAVENOUS | Status: DC
  Administered 2015-03-17 – 2015-03-20 (×4): 40 mg via INTRAVENOUS
  Filled 2015-03-17 (×4): qty 40

## 2015-03-17 MED ORDER — CANAGLIFLOZIN 100 MG PO TABS
300.0000 mg | ORAL_TABLET | Freq: Every day | ORAL | Status: DC
  Administered 2015-03-17: 300 mg via ORAL
  Filled 2015-03-17 (×2): qty 3

## 2015-03-17 MED ORDER — BISACODYL 5 MG PO TBEC
10.0000 mg | DELAYED_RELEASE_TABLET | Freq: Once | ORAL | Status: AC
  Administered 2015-03-17: 10 mg via ORAL
  Filled 2015-03-17: qty 2

## 2015-03-17 MED ORDER — INSULIN ASPART 100 UNIT/ML ~~LOC~~ SOLN
0.0000 [IU] | Freq: Three times a day (TID) | SUBCUTANEOUS | Status: DC
  Administered 2015-03-17 – 2015-03-19 (×3): 2 [IU] via SUBCUTANEOUS
  Administered 2015-03-19 – 2015-03-20 (×3): 5 [IU] via SUBCUTANEOUS
  Administered 2015-03-20 (×2): 3 [IU] via SUBCUTANEOUS
  Administered 2015-03-21: 5 [IU] via SUBCUTANEOUS
  Administered 2015-03-21: 11 [IU] via SUBCUTANEOUS
  Administered 2015-03-21: 8 [IU] via SUBCUTANEOUS
  Administered 2015-03-22: 5 [IU] via SUBCUTANEOUS
  Administered 2015-03-22: 8 [IU] via SUBCUTANEOUS

## 2015-03-17 NOTE — Care Management Note (Signed)
Case Management Note  Patient Details  Name: Vanessa Silva MRN: [id number] Date of Birth: 1960/03/13  Subjective/Objective:                  Pt is from home, and is ind with ADL's. Pt has no DME or HH services prior to admission. Pt plans to return home with self care at DC.   Action/Plan: No CM needs.   Expected Discharge Date:  03/19/15               Expected Discharge Plan:  Home/Self Care  In-House Referral:  NA  Discharge planning Services  CM Consult  Post Acute Care Choice:  NA Choice offered to:  NA  DME Arranged:    DME Agency:     HH Arranged:    HH Agency:     Status of Service:  Completed, signed off  Medicare Important Message Given:    Date Medicare IM Given:    Medicare IM give by:    Date Additional Medicare IM Given:    Additional Medicare Important Message give by:     If discussed at Long Length of Stay Meetings, dates discussed:    Additional Comments:  Malcolm Metro, RN 03/17/2015, 4:14 PM

## 2015-03-17 NOTE — Progress Notes (Signed)
Spoke with patient's nurse Nita Sells. Plan on bowel prep this afternoon/evening. Split prep.
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
## 2015-03-17 NOTE — Progress Notes (Signed)
PROGRESS NOTE  Vanessa MCLOUD QAS:[id number] DOB: 1960-07-10 DOA: 03/16/2015 PCP: [name], MD  Summary: 82 yof PMHx of Barret's esophagus, GERD, HTN, DM and CAD presented with  bloody diarrhea and abdominal pain. Patient reportedly had so many episodes prior to admission that she lost count. She states that she recently has been taking Amoxicillin for the last 8 days secondary to UTI symptoms. She also has been taking Ibuprofen 800mg  3x a day for the last two months secondary to eye pain following laser eye surgery. While in the ED, labs revealed a WBC of 12.5, Hgb of 11.9 (down from baseline of 14) and glucose of 314. Abdominal CT showed sensitive colon diverticulosis without acute diverticulitis. She was admitted for further management.   Assessment/Plan: 1. Rectal bleeding, etiology unclear. Abdominal CT revealed sensitive colon diverticulosis without evidence of acute diverticulitis. Patient has had recent NSAID use with hx of Barrets esophagus. Hgb 10.0 today. GI has consulted and will perform EGD with colonoscopy tomorrow.  2. Normocytic anemia, suspected acute blood loss anemia, Hgb 9.4 today. Hemodynamics stable.  3. CAD. Last echo in 04/2014 with EF of 50-55%.  ASA on hold. Continue Coreg, ACE-I, and Lasix. Troponins negative and telemetry unrevealing.   4. DM type 2, stable. Continue SSI 5. HTN, stable. Continue home medications.  6. Chest pain. Resolved.Troponins negative, EKG and telemetry reassuring. No further evaluation of this point. 7. GERD, PPI on hold.  8. Morbid obesity   Continue to monitor clinically, trend CBC. No indication for transfusion at this point.  No evidence of infection; will stop abx. Continue liquid diet, NPO after midnight and IV Protonix per GI.   D/c telemetry today.   Code Status: Full DVT prophylaxis: SCDs Family Communication: No family at bedside. Disposition Plan: Anticipate discharge in 2-3 days.   Brendia Sacks, MD  [facility]  Pager [phone] If 7PM-7AM, please contact night-coverage at [email or web address], password [facility] 03/17/2015, 8:31 AM  LOS: 1 day   Consultants:  GI  Procedures:    Antibiotics:  Ciprofloxacin 1/18>>   Flagyl 1/18>>  HPI/Subjective: Feels fatigued. Denies any nausea, vomiting, CP, or SOB. Has some lower abdominal pain and blood in her stool.    Objective: Filed Vitals:   03/16/15 2007 03/16/15 2123 03/17/15 0459 03/17/15 0500  BP: 102/68 114/56 114/59   Pulse: 88 87 78   Temp: 98.1 F (36.7 C) 98.5 F (36.9 C) 98 F (36.7 C)   TempSrc: Oral Oral Oral   Resp: 18 19 18    Height:      Weight:  112.764 kg (248 lb 9.6 oz)  112.628 kg (248 lb 4.8 oz)  SpO2: 100% 100% 95%     Intake/Output Summary (Last 24 hours) at 03/17/15 0831 Last data filed at 03/16/15 2124  Gross per 24 hour  Intake      0 ml  Output    100 ml  Net   -100 ml     Filed Weights   03/16/15 1434 03/16/15 2123 03/17/15 0500  Weight: 117.482 kg (259 lb) 112.764 kg (248 lb 9.6 oz) 112.628 kg (248 lb 4.8 oz)    Exam:   VSS, afebrile General:  Appears calm and comfortable Cardiovascular: RRR, no m/r/g. No LE edema. Telemetry: SR, no arrhythmias  Respiratory: CTA bilaterally, no w/r/r. Normal respiratory effort. Abdomen: soft, mildly distended, non tender.  Musculoskeletal: grossly normal tone BUE/BLE Psychiatric: grossly normal mood and affect, speech fluent and appropriate Neurologic: grossly non-focal.  New data reviewed:  WBC improved at 11.2, Hgb 10  Troponin negative  CT scan noted.   Pertinent data since admission:  Glucose 314  Hgb 11.9, WBC 12.5  Troponin and lactic acid negative.    Scheduled Meds: . canagliflozin  300 mg Oral QAC breakfast  . carvedilol  25 mg Oral BID WC  . ciprofloxacin  400 mg Intravenous Q12H  . DULoxetine  30 mg Oral QHS  . furosemide  20 mg Oral Daily  . insulin aspart  0-15 Units Subcutaneous 6 times per day  . lisinopril  20 mg Oral  Daily  . loratadine  10 mg Oral Daily  . metronidazole  500 mg Intravenous Q8H  . montelukast  10 mg Oral Daily  . pantoprazole (PROTONIX) IV  40 mg Intravenous Q24H  . PARoxetine  30 mg Oral Daily  . sodium chloride  3 mL Intravenous Q12H   Continuous Infusions: . sodium chloride 100 mL/hr at 03/16/15 2145    Principal Problem:   Bloody diarrhea Active Problems:   DM (diabetes mellitus), type 2, uncontrolled (HCC)   HTN (hypertension)   Chest discomfort   Fibromyalgia   CAD (coronary artery disease)   Diabetes mellitus without complication (HCC)   HLD (hyperlipidemia)   Morbid obesity (HCC)   Rectal bleed   Abdominal pain   Chronic systolic CHF (congestive heart failure) (HCC)   Time spent 25 minutes   By signing my name below, I, Burnett Harry attest that this documentation has been prepared under the direction and in the presence of Brendia Sacks, MD Electronically signed: Burnett Harry, Scribe.  03/17/2015 10:12am  I personally performed the services described in this documentation. All medical record entries made by the scribe were at my direction. I have reviewed the chart and agree that the record reflects my personal performance and is accurate and complete. Brendia Sacks, MD

## 2015-03-18 ENCOUNTER — Encounter (HOSPITAL_COMMUNITY): Payer: Self-pay | Admitting: *Deleted

## 2015-03-18 ENCOUNTER — Inpatient Hospital Stay (HOSPITAL_COMMUNITY): Payer: Federal, State, Local not specified - PPO | Admitting: Anesthesiology

## 2015-03-18 ENCOUNTER — Encounter (HOSPITAL_COMMUNITY)
Admission: EM | Disposition: A | Discharge status: Home / Self Care | Payer: Self-pay | Source: Home / Self Care | Attending: Family Medicine

## 2015-03-18 DIAGNOSIS — K922 Gastrointestinal hemorrhage, unspecified: Secondary | ICD-10-CM

## 2015-03-18 HISTORY — PX: MALONEY DILATION: SHX5535

## 2015-03-18 HISTORY — PX: COLONOSCOPY WITH PROPOFOL: SHX5780

## 2015-03-18 HISTORY — PX: ESOPHAGOGASTRODUODENOSCOPY (EGD) WITH PROPOFOL: SHX5813

## 2015-03-18 LAB — GASTROINTESTINAL PANEL BY PCR, STOOL (REPLACES STOOL CULTURE)
ASTROVIRUS: NOT DETECTED
Adenovirus F40/41: NOT DETECTED
CAMPYLOBACTER SPECIES: NOT DETECTED
Cryptosporidium: NOT DETECTED
Cyclospora cayetanensis: NOT DETECTED
E. COLI O157: NOT DETECTED
ENTAMOEBA HISTOLYTICA: NOT DETECTED
ENTEROTOXIGENIC E COLI (ETEC): NOT DETECTED
Enteroaggregative E coli (EAEC): DETECTED — AB
Enteropathogenic E coli (EPEC): NOT DETECTED
Giardia lamblia: NOT DETECTED
NOROVIRUS GI/GII: NOT DETECTED
Plesimonas shigelloides: NOT DETECTED
ROTAVIRUS A: NOT DETECTED
SALMONELLA SPECIES: NOT DETECTED
SAPOVIRUS (I, II, IV, AND V): NOT DETECTED
SHIGA LIKE TOXIN PRODUCING E COLI (STEC): NOT DETECTED
SHIGELLA/ENTEROINVASIVE E COLI (EIEC): NOT DETECTED
Vibrio cholerae: NOT DETECTED
Vibrio species: NOT DETECTED
Yersinia enterocolitica: NOT DETECTED

## 2015-03-18 LAB — GLUCOSE, CAPILLARY
GLUCOSE-CAPILLARY: 107 mg/dL — AB (ref 65–99)
GLUCOSE-CAPILLARY: 93 mg/dL (ref 65–99)
GLUCOSE-CAPILLARY: 97 mg/dL (ref 65–99)
Glucose-Capillary: 144 mg/dL — ABNORMAL HIGH (ref 65–99)
Glucose-Capillary: 112 mg/dL — ABNORMAL HIGH (ref 65–99)
Glucose-Capillary: 102 mg/dL — ABNORMAL HIGH (ref 65–99)

## 2015-03-18 LAB — BASIC METABOLIC PANEL
ANION GAP: 8 (ref 5–15)
BUN: 13 mg/dL (ref 6–20)
CALCIUM: 8.7 mg/dL — AB (ref 8.9–10.3)
CO2: 28 mmol/L (ref 22–32)
Chloride: 103 mmol/L (ref 101–111)
Creatinine, Ser: 0.55 mg/dL (ref 0.44–1.00)
GFR calc Af Amer: >60 mL/min (ref >60)
Glucose, Bld: 153 mg/dL — ABNORMAL HIGH (ref 65–99)
POTASSIUM: 3.8 mmol/L (ref 3.5–5.1)
SODIUM: 139 mmol/L (ref 135–145)

## 2015-03-18 LAB — CBC
HCT: 25.7 % — ABNORMAL LOW (ref 36.0–46.0)
HEMATOCRIT: 21.5 % — AB (ref 36.0–46.0)
HEMOGLOBIN: 7.3 g/dL — AB (ref 12.0–15.0)
Hemoglobin: 8.9 g/dL — ABNORMAL LOW (ref 12.0–15.0)
MCH: 30 pg (ref 26.0–34.0)
MCH: 29.3 pg (ref 26.0–34.0)
MCHC: 34.6 g/dL (ref 30.0–36.0)
MCHC: 34 g/dL (ref 30.0–36.0)
MCV: 86.5 fL (ref 78.0–100.0)
MCV: 86.3 fL (ref 78.0–100.0)
PLATELETS: 264 10*3/uL (ref 150–400)
Platelets: 222 10*3/uL (ref 150–400)
RBC: 2.97 MIL/uL — AB (ref 3.87–5.11)
RBC: 2.49 MIL/uL — AB (ref 3.87–5.11)
RDW: 13 % (ref 11.5–15.5)
RDW: 12.9 % (ref 11.5–15.5)
WBC: 11.8 10*3/uL — AB (ref 4.0–10.5)
WBC: 10.6 10*3/uL — ABNORMAL HIGH (ref 4.0–10.5)

## 2015-03-18 LAB — HEMOGLOBIN AND HEMATOCRIT, BLOOD
HEMATOCRIT: 23.2 % — AB (ref 36.0–46.0)
HEMOGLOBIN: 7.8 g/dL — AB (ref 12.0–15.0)

## 2015-03-18 LAB — ABO/RH: ABO/RH(D): A POS

## 2015-03-18 LAB — PREPARE RBC (CROSSMATCH)

## 2015-03-18 SURGERY — COLONOSCOPY WITH PROPOFOL
Anesthesia: Monitor Anesthesia Care

## 2015-03-18 MED ORDER — PROPOFOL 10 MG/ML IV BOLUS
INTRAVENOUS | Status: AC
  Filled 2015-03-18: qty 40

## 2015-03-18 MED ORDER — LIDOCAINE HCL (PF) 1 % IJ SOLN
INTRAMUSCULAR | Status: AC
  Filled 2015-03-18: qty 5

## 2015-03-18 MED ORDER — CIPROFLOXACIN HCL 250 MG PO TABS
500.0000 mg | ORAL_TABLET | Freq: Two times a day (BID) | ORAL | Status: DC
  Administered 2015-03-18 – 2015-03-20 (×4): 500 mg via ORAL
  Filled 2015-03-18 (×4): qty 2

## 2015-03-18 MED ORDER — SODIUM CHLORIDE 0.9 % IV SOLN
Freq: Once | INTRAVENOUS | Status: AC
  Administered 2015-03-18: 19:00:00 via INTRAVENOUS

## 2015-03-18 MED ORDER — EPINEPHRINE HCL 0.1 MG/ML IJ SOSY
PREFILLED_SYRINGE | INTRAMUSCULAR | Status: AC
  Filled 2015-03-18: qty 10

## 2015-03-18 MED ORDER — MIDAZOLAM HCL 2 MG/2ML IJ SOLN
INTRAMUSCULAR | Status: AC
  Filled 2015-03-18: qty 2

## 2015-03-18 MED ORDER — PROPOFOL 10 MG/ML IV BOLUS
INTRAVENOUS | Status: AC
  Filled 2015-03-18: qty 20

## 2015-03-18 MED ORDER — ALBUMIN HUMAN 25 % IV SOLN
25.0000 g | Freq: Once | INTRAVENOUS | Status: AC
  Administered 2015-03-18: 25 g via INTRAVENOUS
  Filled 2015-03-18: qty 100

## 2015-03-18 MED ORDER — LACTATED RINGERS IV SOLN
INTRAVENOUS | Status: DC | PRN
  Administered 2015-03-18 (×2): via INTRAVENOUS

## 2015-03-18 MED ORDER — ONDANSETRON HCL 4 MG/2ML IJ SOLN: 4.0000 mg | Freq: Once | INTRAMUSCULAR | Status: DC | PRN

## 2015-03-18 MED ORDER — LIDOCAINE HCL (CARDIAC) 10 MG/ML IV SOLN
INTRAVENOUS | Status: DC | PRN
  Administered 2015-03-18: 50 mg via INTRAVENOUS

## 2015-03-18 MED ORDER — PROPOFOL 10 MG/ML IV BOLUS
INTRAVENOUS | Status: DC | PRN
  Administered 2015-03-18: 50 mg via INTRAVENOUS

## 2015-03-18 MED ORDER — LIDOCAINE VISCOUS 2 % MT SOLN
6.0000 mL | Freq: Once | OROMUCOSAL | Status: AC
  Administered 2015-03-18: 6 mL via OROMUCOSAL

## 2015-03-18 MED ORDER — PHENYLEPHRINE HCL 10 MG/ML IJ SOLN
INTRAMUSCULAR | Status: DC | PRN
  Administered 2015-03-18: 40 ug via INTRAVENOUS

## 2015-03-18 MED ORDER — SODIUM CHLORIDE 0.9 % IJ SOLN
INTRAMUSCULAR | Status: AC
  Filled 2015-03-18: qty 10

## 2015-03-18 MED ORDER — PHENYLEPHRINE 40 MCG/ML (10ML) SYRINGE FOR IV PUSH (FOR BLOOD PRESSURE SUPPORT)
PREFILLED_SYRINGE | INTRAVENOUS | Status: AC
  Filled 2015-03-18: qty 10

## 2015-03-18 MED ORDER — LIDOCAINE VISCOUS 2 % MT SOLN
OROMUCOSAL | Status: AC
  Filled 2015-03-18: qty 15

## 2015-03-18 MED ORDER — LACTATED RINGERS IV SOLN
INTRAVENOUS | Status: DC
  Administered 2015-03-18: 13:00:00 via INTRAVENOUS

## 2015-03-18 MED ORDER — FENTANYL CITRATE (PF) 100 MCG/2ML IJ SOLN: 25.0000 ug | INTRAMUSCULAR | Status: DC | PRN

## 2015-03-18 MED ORDER — MIDAZOLAM HCL 2 MG/2ML IJ SOLN
1.0000 mg | INTRAMUSCULAR | Status: DC | PRN
  Administered 2015-03-18: 2 mg via INTRAVENOUS

## 2015-03-18 MED ORDER — EPHEDRINE SULFATE 50 MG/ML IJ SOLN
INTRAMUSCULAR | Status: AC
  Filled 2015-03-18: qty 1

## 2015-03-18 MED ORDER — SODIUM CHLORIDE 0.9 % IV SOLN
Freq: Once | INTRAVENOUS | Status: AC
  Administered 2015-03-18: 17:00:00 via INTRAVENOUS

## 2015-03-18 MED ORDER — PROPOFOL 500 MG/50ML IV EMUL
INTRAVENOUS | Status: DC | PRN
  Administered 2015-03-18: 110 ug/kg/min via INTRAVENOUS
  Administered 2015-03-18: 100 ug/kg/min via INTRAVENOUS
  Administered 2015-03-18: 20 ug/kg/min via INTRAVENOUS

## 2015-03-18 MED ORDER — SUCCINYLCHOLINE CHLORIDE 20 MG/ML IJ SOLN
INTRAMUSCULAR | Status: AC
  Filled 2015-03-18: qty 1

## 2015-03-18 MED ORDER — EPHEDRINE SULFATE 50 MG/ML IJ SOLN
INTRAMUSCULAR | Status: DC | PRN
  Administered 2015-03-18 (×4): 5 mg via INTRAVENOUS

## 2015-03-18 MED ORDER — ARTIFICIAL TEARS OP OINT
TOPICAL_OINTMENT | OPHTHALMIC | Status: AC
  Filled 2015-03-18: qty 3.5

## 2015-03-18 NOTE — Anesthesia Procedure Notes (Signed)
Procedure Name: MAC Date/Time: 03/18/2015 12:59 PM Performed by: Franco Nones Pre-anesthesia Checklist: Patient identified, Emergency Drugs available, Suction available, Timeout performed and Patient being monitored Patient Re-evaluated:Patient Re-evaluated prior to inductionOxygen Delivery Method: Non-rebreather mask

## 2015-03-18 NOTE — Progress Notes (Signed)
Patient with GI path panel + for Enteroaggregative E. Coli. Discussed with Dr. Jena Gauss and recommend Cipro 500 mg bid for 10-14 days. Communicated trecommendation to Dr. Irene Limbo via phone.  Discussed with Dr. Irene Limbo earlier. A total of 10 days of therapy should suffice.

## 2015-03-18 NOTE — Progress Notes (Signed)
Up to BR. Voided without difficulty. Transferred to room 302 via w/c in good condition.

## 2015-03-18 NOTE — Progress Notes (Signed)
hgb 27.8, hct 23.2 results given to Dr Jena Gauss. Order given to repeat h/h at 2000 tonight.

## 2015-03-18 NOTE — Anesthesia Preprocedure Evaluation (Addendum)
Anesthesia Evaluation  Patient identified by MRN, date of birth, ID band Patient awake    Reviewed: Allergy & Precautions, NPO status , Patient's Chart, lab work & pertinent test results, reviewed documented beta blocker date and time   Airway Mallampati: II  TM Distance: >3 FB Neck ROM: Full    Dental  (+) Teeth Intact   Pulmonary shortness of breath and with exertion,    Pulmonary exam normal        Cardiovascular hypertension, Pt. on medications and Pt. on home beta blockers + CAD, +CHF and + DOE  Normal cardiovascular exam     Neuro/Psych Depression    GI/Hepatic GERD  Medicated and Controlled,  Endo/Other  diabetes, Poorly Controlled, Type 2, Oral Hypoglycemic Agents  Renal/GU      Musculoskeletal  (+) Fibromyalgia -  Abdominal (+) + obese,   Peds  Hematology   Anesthesia Other Findings   Reproductive/Obstetrics                            Anesthesia Physical Anesthesia Plan  ASA: III  Anesthesia Plan: MAC   Post-op Pain Management:    Induction:   Airway Management Planned: Nasal Cannula  Additional Equipment:   Intra-op Plan:   Post-operative Plan:   Informed Consent: I have reviewed the patients History and Physical, chart, labs and discussed the procedure including the risks, benefits and alternatives for the proposed anesthesia with the patient or authorized representative who has indicated his/her understanding and acceptance.   Dental advisory given  Plan Discussed with: CRNA  Anesthesia Plan Comments:         Anesthesia Quick Evaluation

## 2015-03-18 NOTE — Progress Notes (Signed)
Hgb/hct drawn and sent to lab for results. 

## 2015-03-18 NOTE — Op Note (Signed)
[facility] [address] [place], [address]   ENDOSCOPY PROCEDURE REPORT  PATIENT: [name], [name]  MR#: [id number] BIRTHDATE: October 04, 1960 , 55  yrs. old GENDER: female ENDOSCOPIST: R.  Roetta Sessions, MD FACP FACG REFERRED BY:  Theodis Shove, M.D. PROCEDURE DATE:  April 03, 2015 PROCEDURE:  EGD w/ biopsy and Maloney dilation of esophagus INDICATIONS:  History of Barrett's esophagus reportedly and esophageal dysphagia. MEDICATIONS: Deep sedation per Dr.  [facility] ASA CLASS:      Class III  CONSENT: The risks, benefits, limitations, alternatives and imponderables have been discussed.  The potential for biopsy, esophogeal dilation, etc. have also been reviewed.  Questions have been answered.  All parties agreeable.  Please see the history and physical in the medical record for more information.  DESCRIPTION OF PROCEDURE: After the risks benefits and alternatives of the procedure were thoroughly explained, informed consent was obtained.  The EG-[id number] ([id number]) endoscope was introduced through the mouth and advanced to the second portion of the duodenum , limited by Without limitations. The instrument was slowly withdrawn as the mucosa was fully examined. Estimated blood loss is zero unless otherwise noted in this procedure report.    Examination of the tubular esophagus revealed a slightly undulating Z line.  No obvious Barrett's esophagus, ring, stricture or neoplasm.  No esophagitis.  GE junction easily traversed.  Gastric cavity empty.  1-2 cm hiatal hernia present.  Normal-appearing gastric mucosa.  Patent pylorus.  Normal-appearing first and second portion of the duodenum.  The scope was withdrawn and a 54 [place] Maloney dilators passed upon insertion easily.  A look back reveal no apparent complication related to this maneuver.  Finally, biopsies of the GE junction taken for histologic study.  Retroflexed views revealed a hiatal hernia.      The scope was then withdrawn from the patient and the procedure completed.  COMPLICATIONS: There were no immediate complications. EBL 2 mL ENDOSCOPIC IMPRESSION: Normal-appearing esophagus  -  status post Maloney dilation and biopsy of the GE junction. Small hiatal hernia.  RECOMMENDATIONS: Follow up on pathology. See colonoscopy report  REPEAT EXAM:  eSigned:  R. Roetta Sessions, MD Jerrel Ivory [facility] Apr 03, 2015 2:30 PM    CC:  CPT CODES: ICD CODES:  The ICD and CPT codes recommended by this software are interpretations from the data that the clinical staff has captured with the software.  The verification of the translation of this report to the ICD and CPT codes and modifiers is the sole responsibility of the health care institution and practicing physician where this report was generated.  [employer] will not be held responsible for the validity of the ICD and CPT codes included on this report.  AMA assumes no liability for data contained or not contained herein. CPT is a Publishing rights manager of the [employer].  PATIENT NAME:  Vanessa Silva, Vanessa Silva MR#: [id number]

## 2015-03-18 NOTE — Anesthesia Postprocedure Evaluation (Signed)
Anesthesia Post Note  Patient: Vanessa Silva  Procedure(s) Performed: Procedure(s) (LRB): COLONOSCOPY WITH PROPOFOL (N/A) ESOPHAGOGASTRODUODENOSCOPY (EGD) WITH PROPOFOL (N/A) MALONEY DILATION (N/A)  Patient location during evaluation: PACU Anesthesia Type: MAC Level of consciousness: awake and patient cooperative Pain management: pain level controlled Vital Signs Assessment: post-procedure vital signs reviewed and stable Respiratory status: spontaneous breathing Cardiovascular status: stable Anesthetic complications: no    Last Vitals:  Filed Vitals:   03/18/15 1436 03/18/15 1444  BP: 101/42 112/45  Pulse:    Temp:    Resp: 18 16    Last Pain:  Filed Vitals:   03/18/15 1456  PainSc: Asleep                 [name]

## 2015-03-18 NOTE — Transfer of Care (Signed)
Immediate Anesthesia Transfer of Care Note  Patient: Vanessa Silva  Procedure(s) Performed: Procedure(s) with comments: COLONOSCOPY WITH PROPOFOL (N/A) - polypharmacy reason for propofol ESOPHAGOGASTRODUODENOSCOPY (EGD) WITH PROPOFOL (N/A) MALONEY DILATION (N/A)  Patient Location: PACU  Anesthesia Type:MAC  Level of Consciousness: awake and patient cooperative  Airway & Oxygen Therapy: Patient Spontanous Breathing and non-rebreather face mask  Post-op Assessment: Report given to RN, Post -op Vital signs reviewed and stable and Patient moving all extremities  Post vital signs: Reviewed    Complications: No apparent anesthesia complications

## 2015-03-18 NOTE — Progress Notes (Signed)
hgb 7.8, hct 23.2 results to Dr Benancio Deeds. Cleared for d/c to room.

## 2015-03-18 NOTE — Progress Notes (Signed)
PROGRESS NOTE  Vanessa Silva PYY:[id number] DOB: May 01, 1960 DOA: 03/16/2015 PCP: [name], MD  Summary: 19 yof PMHx of Barret's esophagus, GERD, HTN, DM and CAD presented with  bloody diarrhea and abdominal pain. Patient reportedly had so many episodes of hematochezia prior to admission that she lost count. Previously taking Amoxicillin for the last 8 days secondary to UTI symptoms. Also taking Ibuprofen 800mg  3x a day for the last two months secondary to eye pain following laser eye surgery. While in the ED, labs revealed a WBC of 12.5, Hgb of 11.9 (down from baseline of 14) and glucose of 314. Abdominal CT showed sensitive colon diverticulosis without acute diverticulitis. She was admitted for further management. Plans for EGD and colonoscopy on 03/18/15.  Assessment/Plan: 1. Rectal bleeding, etiology unclear. Abdominal CT revealed sensitive colon diverticulosis without evidence of acute diverticulitis. Patient has had recent NSAID use with hx of Barrets esophagus. Hgb 8.9 today. GI to perform EGD and colonoscopy today  2. Normocytic anemia, suspected acute blood loss anemia, Hgb 8.9 today. Hemodynamics stable.  3. CAD. Last echo in 04/2014 with EF of 50-55%. Continue Coreg, ACE-I, and Lasix. Holding ASA at this time. 4. DM type 2, stable. Continue SSI 5. HTN, stable. Continue home medications.  6. Chest pain. Resolved.Troponins negative, EKG and telemetry reassuring. No further evaluation of this point. 7. GERD, IV PPI 8. Morbid obesity   Continue to monitor clinically, trend CBC. No indication for transfusion at this point.  IV Protonix per GI.     Code Status: Full DVT prophylaxis: SCDs Family Communication: No family at bedside. Disposition Plan: Anticipate discharge in 2-3 days.   Reuben Likes Resident Physician  03/18/2015, 10:36 AM  LOS: 2 days   Consultants:  GI  Procedures:  EGD and Colonoscopy to be performed  03/18/15  Antibiotics:  Ciprofloxacin 1/18>> stopped 1/19   Flagyl 1/18>> stopped 1/19  HPI/Subjective: Feels fatigued and states she has a rough night.  Bowel prep kept her awake most of the night.  Explains continued hematochezia with bowel prep.  Denies any nausea, vomiting, CP, or SOB.  No significant abdominal pain this morning.  Mentions her procedures are scheduled for mid day.  Mentions that she is hopeful for answers with the scopes.  Objective: Filed Vitals:   03/17/15 0500 03/17/15 2042 03/17/15 2100 03/18/15 0628  BP:  105/53 148/66 129/66  Pulse:  89 101 107  Temp:  97.6 F (36.4 C) 97.6 F (36.4 C) 97.7 F (36.5 C)  TempSrc:  Oral Oral Oral  Resp:  18 22 20   Height:      Weight: 112.628 kg (248 lb 4.8 oz)   111.721 kg (246 lb 4.8 oz)  SpO2:  100% 100% 100%    Intake/Output Summary (Last 24 hours) at 03/18/15 1036 Last data filed at 03/18/15 3567  Gross per 24 hour  Intake    723 ml  Output      0 ml  Net    723 ml     Filed Weights   03/16/15 2123 03/17/15 0500 03/18/15 0628  Weight: 112.764 kg (248 lb 9.6 oz) 112.628 kg (248 lb 4.8 oz) 111.721 kg (246 lb 4.8 oz)    Exam:   VSS, afebrile General:  Appears calm and comfortable Cardiovascular: RRR, no m/r/g. No LE edema. Respiratory: CTA bilaterally, no w/r/r. Normal respiratory effort. Abdomen: soft, obese, non tender, bowel sounds in all quadrants.  Musculoskeletal: grossly normal tone BUE/BLE Psychiatric: grossly normal mood and affect, speech fluent  and appropriate Neurologic: grossly non-focal.  New data reviewed:  WBC improved at 11.2, Hgb 10  Troponin negative  CT scan noted.   Pertinent data since admission:  Glucose 314  Hgb 11.9, WBC 12.5  Troponin and lactic acid negative.    Scheduled Meds: . carvedilol  25 mg Oral BID WC  . DULoxetine  30 mg Oral QHS  . furosemide  20 mg Oral Daily  . insulin aspart  0-15 Units Subcutaneous TID WC  . lisinopril  20 mg Oral Daily  .  loratadine  10 mg Oral Daily  . montelukast  10 mg Oral Daily  . pantoprazole (PROTONIX) IV  40 mg Intravenous Q24H  . PARoxetine  30 mg Oral Daily  . sodium chloride  3 mL Intravenous Q12H   Continuous Infusions:    Principal Problem:   Bloody diarrhea Active Problems:   DM (diabetes mellitus), type 2, uncontrolled (HCC)   HTN (hypertension)   Fibromyalgia   CAD (coronary artery disease)   Diabetes mellitus without complication (HCC)   HLD (hyperlipidemia)   Morbid obesity (HCC)   Rectal bleed   Abdominal pain   Chronic systolic CHF (congestive heart failure) (HCC)   Acute blood loss anemia   Barrett's esophagus without dysplasia   Esophageal dysphagia

## 2015-03-18 NOTE — Progress Notes (Signed)
CRITICAL VALUE ALERT  Critical value received:  Stool sample positive for enteroagrigative e. coli  Date of notification:  03/18/2015  Time of notification:  1443  Critical value read back:Yes.    Nurse who received alert:  Antony Odea, RN  MD notified (1st page):  Dr. Irene Limbo  Time of first page:  1445  MD notified (2nd page):   Time of second page:  Responding MD:  Dr. Irene Limbo  Time MD responded:  1450

## 2015-03-18 NOTE — Op Note (Signed)
[facility] [address] [place], [address]   COLONOSCOPY PROCEDURE REPORT  PATIENT: Vanessa Silva, Vanessa Silva  MR#: [id number] BIRTHDATE: 11-19-1960 , 55  yrs. old GENDER: female ENDOSCOPIST: R.  Roetta Sessions, MD FACP [facility] REFERRED IH:[id number] Hassan Rowan, M.D. PROCEDURE DATE:  April 16, 2015 PROCEDURE:   Colonoscopy, diagnostic INDICATIONS:hematochezia. MEDICATIONS: Deep sedation per Dr.  [facility] ASA CLASS:       Class III  CONSENT: The risks, benefits, alternatives and imponderables including but not limited to bleeding, perforation as well as the possibility of a missed lesion have been reviewed.  The potential for biopsy, lesion removal, etc. have also been discussed. Questions have been answered.  All parties agreeable.  Please see the history and physical in the medical record for more information.  DESCRIPTION OF PROCEDURE:   After the risks benefits and alternatives of the procedure were thoroughly explained, informed consent was obtained.  The digital rectal exam revealed no abnormalities of the rectum.   The EC-[id number] ([id number])  endoscope was introduced through the anus and advanced to the cecum, which was identified by both the appendix and ileocecal valve. No adverse events experienced.   The quality of the prep was adequate  The instrument was then slowly withdrawn as the colon was fully examined. Estimated blood loss is zero unless otherwise noted in this procedure report.      COLON FINDINGS: Fresh blood-tinged colonic effluent in the rectum extending all the way to the cecum.  The rectal mucosa appeared normal.  No bleeding lesion identified.  There was relatively fresh blood and clot throughout the lumen of the colon.  There were dozens and dozens of diverticular with clot within many of them from the rectosigmoid junction well into the ascending segment. However, after copious washing and meticulous evaluation of the entire colonic  mucosa, no discrete bleeding was found.  I was unable to intubate the terminal ileum.  However ,there appeared to be more fresh blood in the left colon and proximally.  Retroflexion was performed. .  Withdrawal time=33 minutes 0 seconds.  The scope was withdrawn and the procedure completed. COMPLICATIONS: There were no immediate complications.  ENDOSCOPIC IMPRESSION: Pancolonic diverticulosis. Suspect diverticular bleeding?"which may have recently ceased  RECOMMENDATIONS: Watch closely. Clear liquid diet. Repeat H&H. Keep 2 units of Blood cells on hand. If evidence of ongoing bleeding then I would pursue a nuclear tagged RBC study. See EGD report.  Dr. Karilyn Cota will look in on her over the weekend in my absence.  eSigned:  R. Roetta Sessions, MD Jerrel Ivory [facility] Apr 16, 2015 2:36 PM   cc:  CPT CODES: ICD CODES:  The ICD and CPT codes recommended by this software are interpretations from the data that the clinical staff has captured with the software.  The verification of the translation of this report to the ICD and CPT codes and modifiers is the sole responsibility of the health care institution and practicing physician where this report was generated.  [employer] will not be held responsible for the validity of the ICD and CPT codes included on this report.  AMA assumes no liability for data contained or not contained herein. CPT is a Publishing rights manager of the [employer].  PATIENT NAME:  Vanessa Silva MR#: [id number]

## 2015-03-19 ENCOUNTER — Inpatient Hospital Stay (HOSPITAL_COMMUNITY): Payer: Federal, State, Local not specified - PPO

## 2015-03-19 DIAGNOSIS — D649 Anemia, unspecified: Secondary | ICD-10-CM

## 2015-03-19 DIAGNOSIS — K922 Gastrointestinal hemorrhage, unspecified: Secondary | ICD-10-CM

## 2015-03-19 LAB — BASIC METABOLIC PANEL
Anion gap: 7 (ref 5–15)
BUN: 6 mg/dL (ref 6–20)
CHLORIDE: 103 mmol/L (ref 101–111)
CO2: 28 mmol/L (ref 22–32)
CREATININE: 0.6 mg/dL (ref 0.44–1.00)
Calcium: 8.8 mg/dL — ABNORMAL LOW (ref 8.9–10.3)
GFR calc Af Amer: >60 mL/min (ref >60)
GLUCOSE: 214 mg/dL — AB (ref 65–99)
POTASSIUM: 3.7 mmol/L (ref 3.5–5.1)
Sodium: 138 mmol/L (ref 135–145)

## 2015-03-19 LAB — HEMOGLOBIN AND HEMATOCRIT, BLOOD
HEMATOCRIT: 22.6 % — AB (ref 36.0–46.0)
HEMOGLOBIN: 7.6 g/dL — AB (ref 12.0–15.0)

## 2015-03-19 LAB — CBC
HCT: 23.2 % — ABNORMAL LOW (ref 36.0–46.0)
Hemoglobin: 7.8 g/dL — ABNORMAL LOW (ref 12.0–15.0)
MCH: 29.5 pg (ref 26.0–34.0)
MCHC: 33.6 g/dL (ref 30.0–36.0)
MCV: 87.9 fL (ref 78.0–100.0)
PLATELETS: 283 10*3/uL (ref 150–400)
RBC: 2.64 MIL/uL — AB (ref 3.87–5.11)
RDW: 13.1 % (ref 11.5–15.5)
WBC: 11.2 10*3/uL — AB (ref 4.0–10.5)

## 2015-03-19 LAB — GLUCOSE, CAPILLARY
GLUCOSE-CAPILLARY: 138 mg/dL — AB (ref 65–99)
Glucose-Capillary: 216 mg/dL — ABNORMAL HIGH (ref 65–99)
Glucose-Capillary: 201 mg/dL — ABNORMAL HIGH (ref 65–99)
Glucose-Capillary: 174 mg/dL — ABNORMAL HIGH (ref 65–99)

## 2015-03-19 MED ORDER — TECHNETIUM TC 99M-LABELED RED BLOOD CELLS IV KIT
25.0000 | PACK | Freq: Once | INTRAVENOUS | Status: AC | PRN
  Administered 2015-03-19: 28 via INTRAVENOUS

## 2015-03-19 MED ORDER — HEPARIN SOD (PORK) LOCK FLUSH 100 UNIT/ML IV SOLN
INTRAVENOUS | Status: AC
  Filled 2015-03-19: qty 5

## 2015-03-19 MED ORDER — SODIUM CHLORIDE 0.9 % IJ SOLN
INTRAMUSCULAR | Status: AC
  Filled 2015-03-19: qty 200

## 2015-03-19 NOTE — Progress Notes (Signed)
PROGRESS NOTE  Vanessa Silva ZOX:[id number] DOB: 08-16-1960 DOA: 03/16/2015 PCP: [name], MD  Summary: 57 yof PMHx of Barret's esophagus, GERD, HTN, DM and CAD presented with  bloody diarrhea and abdominal pain. Patient reportedly had so many episodes of hematochezia prior to admission that she lost count. Previously taking Amoxicillin for the last 8 days secondary to UTI symptoms. Also taking Ibuprofen  3x a day for the last two months secondary to eye pain following laser eye surgery. While in the ED, labs revealed a WBC of 12.5, Hgb of 11.9 (down from baseline of 14) and glucose of 314. Abdominal CT showed sensitive colon diverticulosis without acute diverticulitis. She was admitted for further management. Underwent EGD and colonoscopy yesterday and colon showed significant blood throughout without any bleeding lesion.  EGD was unremarkable.  GI pathogen panel found to be positive for E. Coli and patient started on Ciprofloxacin.  She mentions she had a few episodes of bleeding last night and so a tagged RBC scan is to be pursued to help identify bleeding lesion. Overnight patient remained hemodyamically stable.  Assessment/Plan: 1. Rectal bleeding, etiology unclear. Abdominal CT revealed sensitive colon diverticulosis without evidence of acute diverticulitis. Patient has had recent NSAID use with hx of Barrets esophagus. Hgb 7.8 today. EGD and colonoscopy yesterday which showed blood in colon but no active bleeding site.  Tagged RBC scan to be done today. 2. Normocytic anemia, suspected acute blood loss anemia, Hgb 7.8 today. Hemodynamics stable. Repeat H/H at 1400  3. CAD. Last echo in 04/2014 with EF of 50-55%. Continue Coreg, ACE-I, and Lasix. Holding ASA at this time. 4. DM type 2, stable. Continue SSI 5. HTN, stable. Continue home medications.  6. Chest pain. Resolved.Troponins negative, EKG and telemetry reassuring. No further evaluation of this point. 7. GERD, IV  PPI 8. Morbid obesity   Continue to monitor clinically, trend H/H. No indication for transfusion at this point.  Will follow up on tagged RBC study.  IV Protonix per GI.     Code Status: Full DVT prophylaxis: SCDs Family Communication: family members including sister, mother, husband and friend bedside.  Discussed with them the test results as well as the plan for the day. Disposition Plan: Anticipate discharge in 2-3 days.   Reuben Likes Resident Physician  03/19/2015, 11:58 AM  LOS: 3 days   Consultants:  GI  Procedures:  EGD and Colonoscopy performed 03/18/15  Tagged RBC to be performed today  Antibiotics:  Ciprofloxacin 1/18>> stopped 1/19; restarted 03/18/15   Flagyl 1/18>> stopped 1/19  HPI/Subjective: Feels hopeful that she will have a better day.  Hopeful that bleeding scan will expose area of bleeding.  She mentions she did have two bowel movements last night that did not have blood but that was followed by bloody stools.  Is hungry but previously on clear liquids after scopes.  Currently NPO for tagged scan to be performed later today.  Denies any nausea, vomiting, CP, or SOB.  No significant abdominal pain.  Objective: Filed Vitals:   03/18/15 1545 03/18/15 1600 03/18/15 2034 03/19/15 0527  BP: 119/54 120/72 121/49 115/60  Pulse: 94 94 91 97  Temp:   99 F (37.2 C) 99.3 F (37.4 C)  TempSrc:   Oral Oral  Resp: Height:      Weight:    111.676 kg (246 lb 3.2 oz)  SpO2: 100% 100% 96% 100%    Intake/Output Summary (Last 24 hours) at 03/19/15 1158 Last  data filed at 03/19/15 0527  Gross per 24 hour  Intake   2420 ml  Output      0 ml  Net   2420 ml     Filed Weights   03/17/15 0500 03/18/15 0628 03/19/15 0527  Weight: 112.628 kg (248 lb 4.8 oz) 111.721 kg (246 lb 4.8 oz) 111.676 kg (246 lb 3.2 oz)    Exam:   VSS, afebrile General:  Appears calm and comfortable Cardiovascular: RRR, no m/r/g. No LE edema. Respiratory: CTA  bilaterally, no w/r/r. Normal respiratory effort. Abdomen: soft, obese, non tender, bowel sounds in all quadrants.  Musculoskeletal: grossly normal tone BUE/BLE Psychiatric: grossly normal mood and affect, speech fluent and appropriate Neurologic: grossly non-focal.  New data reviewed:  WBC improved at 11.2, Hgb 7.8  EGD and Colonoscopy report reviewed.   Pertinent data since admission:  Glucose 314  Hgb 11.9, WBC 12.5  Troponin and lactic acid negative.    Scheduled Meds: . carvedilol  25 mg Oral BID WC  . ciprofloxacin  500 mg Oral BID  . DULoxetine  30 mg Oral QHS  . furosemide  20 mg Oral Daily  . insulin aspart  0-15 Units Subcutaneous TID WC  . lisinopril  20 mg Oral Daily  . loratadine  10 mg Oral Daily  . montelukast  10 mg Oral Daily  . pantoprazole (PROTONIX) IV  40 mg Intravenous Q24H  . PARoxetine  30 mg Oral Daily  . sodium chloride  3 mL Intravenous Q12H   Continuous Infusions:    Principal Problem:   Bloody diarrhea Active Problems:   DM (diabetes mellitus), type 2, uncontrolled (HCC)   HTN (hypertension)   Fibromyalgia   CAD (coronary artery disease)   Diabetes mellitus without complication (HCC)   HLD (hyperlipidemia)   Morbid obesity (HCC)   Rectal bleed   Abdominal pain   Chronic systolic CHF (congestive heart failure) (HCC)   Acute blood loss anemia   Barrett's esophagus without dysplasia   Esophageal dysphagia   Lower GI bleed

## 2015-03-19 NOTE — Progress Notes (Signed)
  Subjective:  Patient denies abdominal pain but feels bloated across upper abdomen. She denies nausea vomiting heartburn shortness of breath or chest pain. She says she has been on low-dose aspirin chronically but recently had an episode of chest pain and decided to double the dose. Furthermore she has been on ibuprofen 800 mg 3 times a day until a few days ago. She had been taking NSAID following I surgery.  Objective: Blood pressure 115/60, pulse 97, temperature 99.3 F (37.4 C), temperature source Oral, resp. rate 16, height 5\' 8"  (1.727 m), weight 246 lb 3.2 oz (111.676 kg), last menstrual period 06/30/2012, SpO2 100 %. Patient is alert and in no acute distress. She appears pale. Abdomen is full. On palpation it is soft and nontender without organomegaly or masses. No LE edema or clubbing noted.  Labs/studies Results:   Recent Labs  03/18/15 0526 03/18/15 1510 03/18/15 2107 03/19/15 0554  WBC 11.8*  --  10.6* 11.2*  HGB 8.9* 7.8* 7.3* 7.8*  HCT 25.7* 23.2* 21.5* 23.2*  PLT 264  --  222 283    BMET   Recent Labs  03/16/15 1555 03/18/15 0526 03/19/15 0554  NA 134* 139 138  K 4.7 3.8 3.7  CL 101 103 103  CO2 27 28 28   GLUCOSE 314* 153* 214*  BUN 17 13 6   CREATININE 0.68 0.55 0.60  CALCIUM 8.6* 8.7* 8.8*       Assessment:  #1. Lower GI bleed suspected to be from colonic diverticulosis. Patient underwent colonoscopy by Dr. Jena Gauss yesterday revealing multiple diverticula as well as old blood and clots but no bleeding site noted. Risks factors for colonic diverticular bleed include NSAID use(ibuprofen and aspirin). She is hemodynamically stable but reportedly passed some blood. Therefore will proceed with GI bleeding scan hoping to localize sites of bleeding in case further therapy needed. #2. Anemia secondary to GI bleed. Hemoglobin is 7.8 which she is tolerating well.   Recommendations:  GI bleeding scan. H&H had 2 PM.

## 2015-03-19 NOTE — Progress Notes (Signed)
Received  call from [facility] Dr. Claiborne Billings in response to page made by reporting nurse Ian Malkin. reported  that patient's hgb was now 7.3. States he wont transfuse at this point. Request hgb levels on 5am labs to be called at that time for re-evaluation.

## 2015-03-20 DIAGNOSIS — K219 Gastro-esophageal reflux disease without esophagitis: Secondary | ICD-10-CM

## 2015-03-20 DIAGNOSIS — R509 Fever, unspecified: Secondary | ICD-10-CM

## 2015-03-20 LAB — CBC
HEMATOCRIT: 23.5 % — AB (ref 36.0–46.0)
HEMOGLOBIN: 7.8 g/dL — AB (ref 12.0–15.0)
MCH: 29.4 pg (ref 26.0–34.0)
MCHC: 33.2 g/dL (ref 30.0–36.0)
MCV: 88.7 fL (ref 78.0–100.0)
Platelets: 303 10*3/uL (ref 150–400)
RBC: 2.65 MIL/uL — AB (ref 3.87–5.11)
RDW: 13.5 % (ref 11.5–15.5)
WBC: 11.6 10*3/uL — AB (ref 4.0–10.5)

## 2015-03-20 LAB — TYPE AND SCREEN
ABO/RH(D): A POS
Antibody Screen: NEGATIVE
UNIT DIVISION: 0
Unit division: 0

## 2015-03-20 LAB — URINE MICROSCOPIC-ADD ON
RBC / HPF: NONE SEEN RBC/hpf (ref 0–5)
WBC, UA: NONE SEEN WBC/hpf (ref 0–5)

## 2015-03-20 LAB — GLUCOSE, CAPILLARY
GLUCOSE-CAPILLARY: 197 mg/dL — AB (ref 65–99)
Glucose-Capillary: 189 mg/dL — ABNORMAL HIGH (ref 65–99)
Glucose-Capillary: 207 mg/dL — ABNORMAL HIGH (ref 65–99)

## 2015-03-20 LAB — URINALYSIS, ROUTINE W REFLEX MICROSCOPIC
Bilirubin Urine: NEGATIVE
HGB URINE DIPSTICK: NEGATIVE
Leukocytes, UA: NEGATIVE
Nitrite: NEGATIVE
PH: 6 (ref 5.0–8.0)
PROTEIN: NEGATIVE mg/dL
Specific Gravity, Urine: <1.005 — ABNORMAL LOW (ref 1.005–1.030)

## 2015-03-20 LAB — HEMOGLOBIN AND HEMATOCRIT, BLOOD
HCT: 23.4 % — ABNORMAL LOW (ref 36.0–46.0)
HEMOGLOBIN: 7.8 g/dL — AB (ref 12.0–15.0)

## 2015-03-20 MED ORDER — PANTOPRAZOLE SODIUM 40 MG PO TBEC
40.0000 mg | DELAYED_RELEASE_TABLET | Freq: Every day | ORAL | Status: DC
  Administered 2015-03-21 – 2015-03-22 (×2): 40 mg via ORAL
  Filled 2015-03-20 (×2): qty 1

## 2015-03-20 NOTE — Progress Notes (Signed)
  Subjective:  Patient has no complaints. She had bowel movement this morning and passed very small amount of dark stool. She feels bloated but denies abdominal pain. She denies heartburn nausea or vomiting. She feels hungry. She feels weak when she walks to the bathroom.  Objective: Blood pressure 133/62, pulse 98, temperature 99.5 F (37.5 C), temperature source Oral, resp. rate 18, height 5\' 8"  (1.727 m), weight 243 lb 9.6 oz (110.496 kg), last menstrual period 06/30/2012, SpO2 99 %.  patient is alert and in no acute distress.  She appears pale.  Abdomen is protuberant but soft and nontender without organomegaly or masses.  Labs/studies Results:   Recent Labs  03/18/15 2107 03/19/15 0554 03/19/15 1635 03/20/15 0548  WBC 10.6* 11.2*  --  11.6*  HGB 7.3* 7.8* 7.6* 7.8*  HCT 21.5* 23.2* 22.6* 23.5*  PLT 222 283  --  303    BMET   Recent Labs  03/18/15 0526 03/19/15 0554  NA 139 138  K 3.8 3.7  CL 103 103  CO2 28 28  GLUCOSE 153* 214*  BUN 13 6  CREATININE 0.55 0.60  CALCIUM 8.7* 8.8*     Assessment:  #1. Lower GI bleed secondary to colonic diverticulosis. GI bleeding scan yesterday afternoon was negative. She has stopped bleeding. Hemoglobin is low but she is tolerating it well. He also underwent EGD by Dr. Jena Gauss and no lesion found on upper GI tract account for GI blood loss. #2.  Anemia secondary to GI bleed. #3.  GERD. Heartburn well controlled with PPI.  Recommendations   Begin modified carb diet.  Change pantoprazole to oral route.   H&H at 2 PM an CBC in a.m.

## 2015-03-20 NOTE — Anesthesia Postprocedure Evaluation (Signed)
Anesthesia Post Note  Patient: Vanessa Silva  Procedure(s) Performed: Procedure(s) (LRB): COLONOSCOPY WITH PROPOFOL (N/A) ESOPHAGOGASTRODUODENOSCOPY (EGD) WITH PROPOFOL (N/A) MALONEY DILATION (N/A)  Patient location during evaluation: Nursing Unit Anesthesia Type: MAC Level of consciousness: awake and alert Pain management: satisfactory to patient Vital Signs Assessment: post-procedure vital signs reviewed and stable Respiratory status: spontaneous breathing Cardiovascular status: stable Anesthetic complications: no    Last Vitals:  Filed Vitals:   03/19/15 2133 03/20/15 0500  BP: 120/51 133/62  Pulse: 95 98  Temp: 38.4 C 37.5 C  Resp: 18 18    Last Pain:  Filed Vitals:   03/20/15 0537  PainSc: 0-No pain                 Nanetta Wiegman

## 2015-03-20 NOTE — Progress Notes (Signed)
Patient has not a bowel movement since seen earlier today. No globe and at 2 PM was 7.8. Patient spiked a temp Dr. Irene Limbo has obtained blood and urine cultures. GI pathogen panel was positive for temporal aggregative Escherichia coli patient was empirically treated with Cipro. He does not have diarrhea or endoscopic evidence of colitis. Therefore Cipro to be stopped as discussed with Dr. Irene Limbo.  She reports she was treated with amoxicillin for 10 days for bronchitis.she finished antibiotic therapy earlier this week.  She has chronic aches and pains secondary to fibromyalgia but she does not report that this has gotten worse over the last 24 hours. She denies dysuria.  He also denies chest pain or shortness of breath.  Examination of lower extremities negative for tenderness or edema.  Both Hep-Lock sites without erythema or tenderness.    Assessment:  #1.GI bleed most likely from colonic diverticulosis and appears to be inactive.        Hemoglobin is low but stable.  #2.  Fever. No obvious source. She has relative bradycardia which may indicate viral infection. If cultures are negative she may need echo among other things.

## 2015-03-20 NOTE — Addendum Note (Signed)
Addendum  created 03/20/15 1225 by Franco Nones, CRNA   Modules edited: Notes Section   Notes Section:  File: [id number]

## 2015-03-20 NOTE — Progress Notes (Signed)
PROGRESS NOTE  Vanessa Silva HYQ:[id number] DOB: [birth year] DOA: 03/16/2015 PCP: [name], MD  Summary: 60 yof PMHx of Barret's esophagus, GERD, HTN, DM and CAD presented with  bloody diarrhea and abdominal pain. Patient reportedly had so many episodes of hematochezia prior to admission that she lost count. Previously taking Amoxicillin for the last 8 days secondary to UTI symptoms. Also taking Ibuprofen  3x a day for the last two months secondary to eye pain following laser eye surgery. While in the ED, labs revealed a WBC of 12.5, Hgb of 11.9 (baseline of 14) and glucose of 314. Abdominal CT showed sensitive colon diverticulosis without acute diverticulitis. She was admitted for further management. Underwent EGD and colonoscopy yesterday and colon showed significant blood throughout without any bleeding lesion.  EGD was unremarkable.  GI pathogen panel found to be positive for E. Coli and patient started on Ciprofloxacin.  Tagged RBC scan on 03/19/15 did not identify source of bleeding.   Assessment/Plan: 1. Rectal bleeding, etiology unclear. Abdominal CT revealed sensitive colon diverticulosis without evidence of acute diverticulitis. Patient has had recent NSAID use with hx of Barrets esophagus. Hgb 7.8 today. EGD and colonoscopy showed blood in colon but no active bleeding site.  Tagged RBC scan did not clearly identify source of bleeding. Patient with melena this morning but not bright red blood per rectum. 2. Normocytic anemia, suspected acute blood loss anemia, Hgb 7.8 today. Hemodynamics stable. Repeat H/H at 1400 and CBC in AM 3. CAD. Last echo in 04/2014 with EF of 50-55%. Continue Coreg, ACE-I, and Lasix. Holding ASA at this time. 4. DM type 2, stable. Continue SSI 5. HTN, stable. Continue home medications.  6. Chest pain. Resolved 7. GERD, IV PPI 8. Morbid obesity   Continue to monitor clinically, trend H/H. No indication for transfusion at this point.  Patient  encouraged to tell nursing staff if she experiences anymore bright red blood per rectum.  PO Protonix per GI.     Code Status: Full DVT prophylaxis: SCDs Family Communication: family members including mother and husband bedside.  Discussed with them the test results as well as the plan for the day. Disposition Plan: Anticipate discharge in 2-3 days.   Reuben Likes MD Resident Physician  03/20/2015, 11:01 AM  LOS: 4 days   Consultants:  GI  Procedures:  EGD and Colonoscopy performed 03/18/15  Tagged RBC 03/19/15  Antibiotics:  Ciprofloxacin 1/18>> stopped 1/19; restarted 03/18/15   Flagyl 1/18>> stopped 1/19  HPI/Subjective: Patient denies that she is experiencing anymore significant bright red blood per rectum.  She states she was told there could have been old blood in her stool this morning.  Denies abdominal pain.  Denies any emesis.  Was placed on a modified carb diet and did eat breakfast this morning.  Denies any complications with eating breakfast.  Patient does mention that she feels slightly bloated.  She did have a fever yesterday and again last night but has been afebrile today.    Objective: Filed Vitals:   03/19/15 1253 03/19/15 2057 03/19/15 2133 03/20/15 0500  BP: 124/64  120/51 133/62  Pulse: 94 96 95 98  Temp: 103.1 F (39.5 C)  101.2 F (38.4 C) 99.5 F (37.5 C)  TempSrc: Oral  Oral Oral  Resp: Height:      Weight:    110.496 kg (243 lb 9.6 oz)  SpO2: 100% 99% 99% 99%    Intake/Output Summary (Last 24 hours) at 03/20/15 1101  Last data filed at 03/20/15 0844  Gross per 24 hour  Intake    857 ml  Output      5 ml  Net    852 ml     Filed Weights   03/18/15 0628 03/19/15 0527 03/20/15 0500  Weight: 111.721 kg (246 lb 4.8 oz) 111.676 kg (246 lb 3.2 oz) 110.496 kg (243 lb 9.6 oz)    Exam:   VSS, afebrile General:  Appears calm and comfortable Cardiovascular: RRR, no m/r/g. No LE edema. Respiratory: CTA bilaterally, no  w/r/r. Normal respiratory effort. Abdomen: soft, obese, non tender, bowel sounds in all quadrants.  Musculoskeletal: grossly normal tone BUE/BLE Psychiatric: grossly normal mood and affect, speech fluent and appropriate Neurologic: grossly non-focal.  New data reviewed:  WBC improved at 11.2, Hgb 7.8  EGD and Colonoscopy report reviewed.   Pertinent data since admission:  Glucose 314  Hgb 11.9, WBC 12.5  Troponin and lactic acid negative.    Scheduled Meds: . carvedilol  25 mg Oral BID WC  . ciprofloxacin  500 mg Oral BID  . DULoxetine  30 mg Oral QHS  . furosemide  20 mg Oral Daily  . insulin aspart  0-15 Units Subcutaneous TID WC  . lisinopril  20 mg Oral Daily  . loratadine  10 mg Oral Daily  . montelukast  10 mg Oral Daily  . pantoprazole (PROTONIX) IV  40 mg Intravenous Q24H  . PARoxetine  30 mg Oral Daily  . sodium chloride  3 mL Intravenous Q12H   Continuous Infusions:    Principal Problem:   Bloody diarrhea Active Problems:   DM (diabetes mellitus), type 2, uncontrolled (HCC)   HTN (hypertension)   Fibromyalgia   CAD (coronary artery disease)   Diabetes mellitus without complication (HCC)   HLD (hyperlipidemia)   Morbid obesity (HCC)   Rectal bleed   Abdominal pain   Chronic systolic CHF (congestive heart failure) (HCC)   Acute blood loss anemia   Barrett's esophagus without dysplasia   Esophageal dysphagia   Lower GI bleed

## 2015-03-20 NOTE — Progress Notes (Signed)
Key Points: Use following P&T approved IV to PO antibiotic change policy.  Description contains the criteria that are approved Note: Policy Excludes:  Esophagectomy patientsPHARMACIST - PHYSICIAN COMMUNICATION DR:    CONCERNING: IV to Oral Route Change Policy  RECOMMENDATION: This patient is receiving Protonix by the intravenous route.  Based on criteria approved by the Pharmacy and Therapeutics Committee, the intravenous medication(s) is/are being converted to the equivalent oral dose form(s).   DESCRIPTION: These criteria include:  The patient is eating (either orally or via tube) and/or has been taking other orally administered medications for a least 24 hours  The patient has no evidence of active gastrointestinal bleeding or impaired GI absorption (gastrectomy, short bowel, patient on TNA or NPO).  If you have questions about this conversion, please contact the Pharmacy Department  [x]   [phone] )  [facility] []   [phone] )  [facility] []   [phone] )  [facility] []   [phone] )  [facility] []   [phone] )  [facility]   Mosetta Pigeon [place], [place] 03/20/2015 1:31 PM

## 2015-03-21 ENCOUNTER — Institutional Professional Consult (permissible substitution): Payer: Federal, State, Local not specified - PPO | Admitting: Neurology

## 2015-03-21 DIAGNOSIS — R509 Fever, unspecified: Secondary | ICD-10-CM

## 2015-03-21 LAB — CBC
HEMATOCRIT: 25.3 % — AB (ref 36.0–46.0)
Hemoglobin: 8.3 g/dL — ABNORMAL LOW (ref 12.0–15.0)
MCH: 29.2 pg (ref 26.0–34.0)
MCHC: 32.8 g/dL (ref 30.0–36.0)
MCV: 89.1 fL (ref 78.0–100.0)
Platelets: 343 10*3/uL (ref 150–400)
RBC: 2.84 MIL/uL — AB (ref 3.87–5.11)
RDW: 13.9 % (ref 11.5–15.5)
WBC: 10.9 10*3/uL — AB (ref 4.0–10.5)

## 2015-03-21 LAB — GLUCOSE, CAPILLARY
GLUCOSE-CAPILLARY: 262 mg/dL — AB (ref 65–99)
Glucose-Capillary: 305 mg/dL — ABNORMAL HIGH (ref 65–99)
Glucose-Capillary: 214 mg/dL — ABNORMAL HIGH (ref 65–99)
Glucose-Capillary: 218 mg/dL — ABNORMAL HIGH (ref 65–99)

## 2015-03-21 LAB — SEDIMENTATION RATE: SED RATE: 20 mm/h (ref 0–22)

## 2015-03-21 NOTE — Progress Notes (Signed)
Inpatient Diabetes Program Recommendations  AACE/ADA: New Consensus Statement on Inpatient Glycemic Control (2015)  Target Ranges:  Prepandial:   less than 140 mg/dL      Peak postprandial:   less than 180 mg/dL (1-2 hours)      Critically ill patients:  140 - 180 mg/dL   Review of Glycemic Control  Diabetes history: type 2 Outpatient Diabetes medications: Invokana and glipizide Current orders for Inpatient glycemic control: moderate correction scale tidwc  Inpatient Diabetes Program Recommendations:    Fasting glucose high in 300's this am. Please consider adding lantus 10 units at HS or am to regimen. Patient appears to need some meal coverage as well, start with 3 units novolog meal coverage tidwc  Thank you Lenor Coffin, RN, MSN, CDE  Diabetes Inpatient Program Office: [phone] Pager: [phone] 8:00 am to 5:00 pm

## 2015-03-21 NOTE — Progress Notes (Signed)
Subjective: Feels weak and fatigued. Very mild RLQ discomfort, radiating around to back. Last evidence of dark stool yesterday morning, small amount, mixed with liquid stool. No further overt GI bleeding. Afebrile this morning.   Objective: Vital signs in last 24 hours: Temp:  [98.6 F (37 C)-100.9 F (38.3 C)] 98.6 F (37 C) (01/23 0631) Pulse Rate:  [82-92] 82 (01/23 0631) Resp:  [18] 18 (01/23 0631) BP: (98-117)/(48-55) 104/54 mmHg (01/23 0631) SpO2:  [96 %-100 %] 98 % (01/23 0903) Weight:  [243 lb 4.8 oz (110.36 kg)] 243 lb 4.8 oz (110.36 kg) (01/23 0631) Last BM Date: 03/20/15 General:   Alert and oriented, pleasant Head:  Normocephalic and atraumatic. Eyes:  No icterus, sclera clear. Conjuctiva pink.  Abdomen:  Bowel sounds present, soft, very mild TTP RLQ non-distended. No HSM or hernias noted. No rebound or guarding. No masses appreciated  Msk:  Symmetrical without gross deformities. Normal posture. Neurologic:  Alert and  oriented x4;  grossly normal neurologically. Psych:  Alert and cooperative. Normal mood and affect.  Intake/Output from previous day: 01/22 0701 - 01/23 0700 In: 963 [P.O.:960; I.V.:3] Out: 3 [Urine:3] Intake/Output this shift: Total I/O In: 3 [I.V.:3] Out: -   Lab Results:  Recent Labs  03/19/15 0554  03/20/15 0548 03/20/15 1345 03/21/15 0524  WBC 11.2*  --  11.6*  --  10.9*  HGB 7.8*  < > 7.8* 7.8* 8.3*  HCT 23.2*  < > 23.5* 23.4* 25.3*  PLT 283  --  303  --  343  < > = values in this interval not displayed. BMET  Recent Labs  03/19/15 0554  NA 138  K 3.7  CL 103  CO2 28  GLUCOSE 214*  BUN 6  CREATININE 0.60  CALCIUM 8.8*    Studies/Results: Nm Gi Blood Loss  03/19/2015  CLINICAL DATA:  Gastrointestinal bleeding for 4 days EXAM: NUCLEAR MEDICINE GASTROINTESTINAL BLEEDING SCAN TECHNIQUE: Sequential abdominal images were obtained following intravenous administration of Tc-12m labeled red blood cells. RADIOPHARMACEUTICALS:   Twenty-eight mCi Tc-53m in-vitro labeled red cells. COMPARISON:  03/16/2015 FINDINGS: Uptake over the heart liver spleen and bladder noted. Vascular uptake noted as anticipated. No abnormal uptake over the abdomen or pelvis to indicate a source of bleeding. IMPRESSION: Negative for detection of source of gastrointestinal hemorrhage. Electronically Signed   By: Esperanza Heir M.D.   On: 03/19/2015 16:18    Assessment: 55 year old female admitted with bloody stools, undergoing colonoscopy revealing multiple diverticula and diverticular bleed suspected. EGD with normal-appearing esophagus s/p dilation and biopsy of the GE junction. GI pathogen panel also revealed Enteroaggregative E. coli with Cipro for treatment recommended; however, this was stopped yesterday morning (1/22) as she was not having diarrhea or endoscopic evidence of colitis and clinically improved from a diarrheal standpoint.  Due to bleeding over weekend, bleeding scan completed and negative. Febrile over weekend with blood and urine cultures obtained; thus far no growth from blood cultures. Hgb has remained in 7 range for several days now, with improvement to 8.3 this morning. No overt GI bleeding for 24 hours now. Clinically, she has improved from a GI standpoint,    Plan: Continue Protonix once daily  Agree with discontinuing Cipro in light of clinical findings and cessation of diarrhea  Follow-up on pathology from EGD Monitor H/H Close follow-up as outpatient in our office: we will arrange.    Nira Retort, ANP-BC [facility]    LOS: 5 days    03/21/2015, 9:14 AM

## 2015-03-21 NOTE — Progress Notes (Signed)
PROGRESS NOTE  Vanessa Silva MVH:[id number] DOB: 1960-06-03 DOA: 03/16/2015 PCP: [name], MD  Summary: 29 yof PMHx of Barret's esophagus, GERD, HTN, DM and CAD presented with  bloody diarrhea and abdominal pain. Patient reportedly had so many episodes of hematochezia prior to admission that she lost count. Previously taking Amoxicillin for the last 8 days secondary to UTI symptoms. Also taking Ibuprofen  3x a day for the last two months secondary to eye pain following laser eye surgery. While in the ED, labs revealed a WBC of 12.5, Hgb of 11.9 (baseline of 14) and glucose of 314. Abdominal CT showed sensitive colon diverticulosis without acute diverticulitis. She was admitted for further management. Underwent EGD and colonoscopy which showed significant blood throughout without any bleeding lesion.  EGD was unremarkable.  GI pathogen panel found to be positive for E. Coli and patient started on Ciprofloxacin.  Tagged RBC scan on 03/19/15 did not identify source of bleeding. Ciprofloxacin stopped 03/20/15.  Diet advanced 03/20/15 and patient did not experience any further episodes of hematochezia 1/22-1/23.  Assessment/Plan: 1. Rectal bleeding, etiology unclear. Abdominal CT revealed sensitive colon diverticulosis without evidence of acute diverticulitis. Patient has had recent NSAID use with hx of Barrets esophagus. Hgb 7.8 today. EGD and colonoscopy showed blood in colon but no active bleeding site.  Tagged RBC scan did not clearly identify source of bleeding. No bleeding since 1/22 in am.  Patient H/H remains stable. Diet advanced. 2. Normocytic anemia likely 2/2 suspected acute blood loss anemia; Hemodynamics stable; H/H slightly improved today  3. CAD. Last echo in 04/2014 with EF of 50-55%. Continue Coreg, ACE-I, and Lasix. Holding ASA at this time. 4. DM type 2, stable. Continue SSI. Outpatient patient is on Invokana and glipizide and states she has been well controlled with  those medications 5. HTN, stable. Continue home medications.  6. GERD: PPI   Continue to monitor clinically, trend H/H. No indication for transfusion at this point.  Patient encouraged to tell nursing staff if she experiences anymore bright red blood per rectum.  PO Protonix per GI.     Code Status: Full DVT prophylaxis: SCDs Family Communication: family members including mother and husband bedside.  Discussed with them the test results as well as the plan for the day. Disposition Plan: Anticipate discharge in 1-2 days.   Reuben Likes MD Resident Physician  03/21/2015, 2:05 PM  LOS: 5 days   Consultants:  GI  Procedures:  EGD and Colonoscopy performed 03/18/15  Tagged RBC 03/19/15  Antibiotics:  Ciprofloxacin 1/18>> stopped 1/19; restarted 03/18/15 stopped 03/20/15   Flagyl 1/18>> stopped 1/19  HPI/Subjective: Patient is doing so- so today.  She mentions that she has been feeling overwhelmed while in the hospital and that she feels as though she cannot remember everything everyone has said.  She is feeling stressed about being out of work as well as about various family members which are struggling with their own medical problems.  Today she discussed her concerns about outpatient getting her diabetes under control as well as getting a sleep study set up to evaluate for sleep apnea.  She voiced worry about all the medications she is on and that she is not feeling any better since starting any of these medications she is on.  She wants to be healthier but feels she is taking 1 step forward and 3 steps backwards.  She has not had any more bloody bowel movements.  She did feel feverish overnight. Denies chest pain, chest  pressure, shortness of breath, dysuria, hematuria.   Objective: Filed Vitals:   03/21/15 0631 03/21/15 0903 03/21/15 1315 03/21/15 1333  BP: 104/54  103/53 108/57  Pulse: 82  77   Temp: 98.6 F (37 C)  98.5 F (36.9 C)   TempSrc: Oral  Oral   Resp: 18   18   Height:      Weight: 110.36 kg (243 lb 4.8 oz)     SpO2: 99% 98% 98%     Intake/Output Summary (Last 24 hours) at 03/21/15 1405 Last data filed at 03/21/15 1200  Gross per 24 hour  Intake    683 ml  Output      3 ml  Net    680 ml     Filed Weights   03/19/15 0527 03/20/15 0500 03/21/15 0631  Weight: 111.676 kg (246 lb 3.2 oz) 110.496 kg (243 lb 9.6 oz) 110.36 kg (243 lb 4.8 oz)    Exam:   VSS, afebrile General:  Appears calm and comfortable Cardiovascular: RRR, no m/r/g. No LE edema. Respiratory: CTA bilaterally, no w/r/r. Normal respiratory effort. Abdomen: soft, obese, non tender, bowel sounds in all quadrants.  Musculoskeletal: grossly normal tone BUE/BLE Psychiatric: grossly normal mood and affect, speech fluent and appropriate Neurologic: grossly non-focal.  New data reviewed:  WBC improved at 11.2, Hgb 7.8  EGD and Colonoscopy report reviewed.   Pertinent data since admission:  Glucose 314  Hgb 11.9, WBC 12.5  Troponin and lactic acid negative.    Scheduled Meds: . carvedilol  25 mg Oral BID WC  . DULoxetine  30 mg Oral QHS  . furosemide  20 mg Oral Daily  . insulin aspart  0-15 Units Subcutaneous TID WC  . lisinopril  20 mg Oral Daily  . loratadine  10 mg Oral Daily  . montelukast  10 mg Oral Daily  . pantoprazole  40 mg Oral Daily  . PARoxetine  30 mg Oral Daily  . sodium chloride  3 mL Intravenous Q12H   Continuous Infusions:    Principal Problem:   Bloody diarrhea Active Problems:   DM (diabetes mellitus), type 2, uncontrolled (HCC)   HTN (hypertension)   Fibromyalgia   CAD (coronary artery disease)   Diabetes mellitus without complication (HCC)   HLD (hyperlipidemia)   Morbid obesity (HCC)   Rectal bleed   Abdominal pain   Chronic systolic CHF (congestive heart failure) (HCC)   Acute blood loss anemia   Barrett's esophagus without dysplasia   Esophageal dysphagia   Lower GI bleed

## 2015-03-21 NOTE — Progress Notes (Signed)
Patient SBP in the 90's at this time. 1700 Coreg held. Lisinopril was held earlier today also. Dr. Irene Limbo notified.

## 2015-03-22 ENCOUNTER — Encounter (HOSPITAL_COMMUNITY): Payer: Self-pay | Admitting: Internal Medicine

## 2015-03-22 ENCOUNTER — Telehealth: Payer: Self-pay | Admitting: Gastroenterology

## 2015-03-22 DIAGNOSIS — K922 Gastrointestinal hemorrhage, unspecified: Secondary | ICD-10-CM | POA: Insufficient documentation

## 2015-03-22 LAB — URINE CULTURE

## 2015-03-22 LAB — GLUCOSE, CAPILLARY
Glucose-Capillary: 258 mg/dL — ABNORMAL HIGH (ref 65–99)
Glucose-Capillary: 215 mg/dL — ABNORMAL HIGH (ref 65–99)
Glucose-Capillary: 215 mg/dL — ABNORMAL HIGH (ref 65–99)

## 2015-03-22 MED ORDER — ALPRAZOLAM 0.25 MG PO TABS
0.1250 mg | ORAL_TABLET | Freq: Once | ORAL | Status: AC
  Administered 2015-03-22: 0.125 mg via ORAL
  Filled 2015-03-22: qty 1

## 2015-03-22 NOTE — Telephone Encounter (Signed)
Please arrange outpatient hospital follow-up in 2-4 weeks.

## 2015-03-22 NOTE — Progress Notes (Signed)
Patient states understanding of discharge instructions.  

## 2015-03-22 NOTE — Telephone Encounter (Signed)
APPT MADE AND NURSE ON 300 WILL TELL PATIENT

## 2015-03-22 NOTE — Progress Notes (Signed)
REVIEWED-NO ADDITIONAL RECOMMENDATIONS.   Subjective: No further diarrhea, no overt GI bleeding. Took a shower around 5am this morning. Since then has felt "heavy" in chest and abdomen. Points to entire torso. Reports mild shortness of breath. Nausea onset this morning when waking.    Objective: Vital signs in last 24 hours: Temp:  [98.4 F (36.9 C)-99.6 F (37.6 C)] 98.4 F (36.9 C) (01/24 0535) Pulse Rate:  [77-82] 82 (01/24 0535) Resp:  [18-20] 20 (01/24 0535) BP: (90-137)/(47-62) 93/48 mmHg (01/24 0535) SpO2:  [98 %-100 %] 100 % (01/24 0535) Weight:  [240 lb 3.2 oz (108.954 kg)] 240 lb 3.2 oz (108.954 kg) (01/24 0535) Last BM Date: 03/20/15 General:   Alert and oriented, pleasant. NO distress apparent.  Head:  Normocephalic and atraumatic. Eyes:  No icterus, sclera clear. Conjuctiva pink.  Lungs: Clear to auscultation bilaterally, without wheezing, rales, or rhonchi.  Abdomen:  Bowel sounds present, soft, non-tender, non-distended. Obese Neurologic:  Alert and  oriented x4 Psych:  Alert and cooperative. Somewhat flat affect   Intake/Output from previous day: 01/23 0701 - 01/24 0700 In: 1406 [P.O.:1400; I.V.:6] Out: 1000 [Urine:1000] Intake/Output this shift:    Lab Results:  Recent Labs  03/20/15 0548 03/20/15 1345 03/21/15 0524  WBC 11.6*  --  10.9*  HGB 7.8* 7.8* 8.3*  HCT 23.5* 23.4* 25.3*  PLT 303  --  343    Assessment: 55 year old female admitted with bloody stools, undergoing colonoscopy revealing multiple diverticula and diverticular bleed suspected. EGD with normal-appearing esophagus s/p dilation and biopsy of the GE junction. GI pathogen panel also revealed Enteroaggregative E. coli with Cipro for treatment recommended; however, this was stopped  1/22 as she was not having diarrhea or endoscopic evidence of colitis and clinically improved from a diarrheal standpoint. Due to bleeding over weekend, bleeding scan completed and negative. Febrile over  weekend with blood and urine cultures obtained; thus far no growth from blood cultures. Hgb had remained in 7 range for several days, with improvement to 8.3 yesterday. She continues to be without any further overt GI bleeding. Some mild nausea this morning likely secondary to increased activity (taking shower early this morning). She also notes a "heaviness" in her entire torso, pointing to chest and abdomen. BP stable, O2 sats 100, pulse in mid 90s. She does not appear to be in any acute distress. Nursing staff made aware, and they discussed with attending. Doubt any acute etiology at this time. From a GI standpoint, patient is appropriate for discharge home with close outpatient follow-up.    Plan: Continue Protonix once daily Follow-up on EGD pathology Close follow-up in our office: we will arrange   Nira Retort, ANP-BC [facility]    LOS: 6 days    03/22/2015, 8:05 AM

## 2015-03-22 NOTE — Discharge Summary (Signed)
Physician Discharge Summary  Vanessa Silva WJX:[id number] DOB: Jul 08, 1960 DOA: 03/16/2015  PCP: [name], MD  Admit date: 03/16/2015 Discharge date: 03/22/2015  Recommendations for Outpatient Follow-up:  1. See PCP within the next week 2. Reminders for patient to discuss with PCP: overnight sleep study for evaluation of OSA, discuss current antidepressant medication as well as medication for diabetes  Follow-up Information    Follow up with [name], MD. Schedule an appointment as soon as possible for a visit in 1 week.   Specialty:  Family Medicine   Why:  hospital follow up   Contact information:   [address] [place] [address] [phone]       Follow up with [name], MD On 04/18/2015.   Specialty:  Gastroenterology   Why:  Follow up appointment at 2:30pm   Contact information:   [address], [address] [place] [address] [phone]       Follow up with [name], MD. Schedule an appointment as soon as possible for a visit in 2 weeks.   Specialty:  Family Medicine   Contact information:   [address] [place] [address] [phone]      Discharge Diagnoses:  1. Bloody diarrhea 2. Hematochezia  Discharge Condition: stable Disposition: home with no services  Diet recommendation: reduced carbohydrate diet  Filed Weights   03/20/15 0500 03/21/15 0631 03/22/15 0535  Weight: 110.496 kg (243 lb 9.6 oz) 110.36 kg (243 lb 4.8 oz) 108.954 kg (240 lb 3.2 oz)    History of present illness:  55 year old morbidly obese female with history of coronary artery disease with nonischemic cardiomyopathy (EF of 30-35% in 2015 with nonobstructive disease on cardiac cath, follow-up echo in March 2016 showed EF of 50-55% with grade 1 diastolic dysfunction), hypertension, hyperlipidemia, GERD, Barrett's esophagus, fibromyalgia, anxiety and depression presented to the ED with acute onset of diarrhea with  bright red blood per rectum. Patient reports getting up at 1 in the morning today with numerous episodes of large-volume diarrhea mixed with bright red blood almost every time. The episodes were so frequent that she lost count. She also had dull aching left lower quadrant abdominal pain radiating to the center (6/10 in intensity without any aggravating or relieving factors). She reports feeling lightheaded and fell as was done to pass out. Denies any nausea or vomiting, headache, fevers, chills, palpitations, shortness of breath, urinary symptoms. Denies eating anything outside. She reports that several of her coworkers have been sick for the past week. She also had URI symptoms last week and he has been taking amoxicillin twice a day for the last 8 days prescribed by her PCP. Denies any recent travel. Denies any change in her weight or appetite. Patient still having rectal bleed while in the ED. She also informs that for the past 2 months she has been taking ibuprofen 800 mg 3 times a day for pain in the back of her eye following laser surgery and for the past week she has been tapering it down to 800 mg once a day. She also has started taking 2 baby aspirin for the past 2 weeks for off-and-on chest pain symptoms.  Hospital Course:  76 yof PMHx of Barret's esophagus, GERD, HTN, DM and CAD presented with bloody diarrhea and abdominal pain. Patient reportedly had so many episodes of hematochezia prior to admission that she lost count. Previously taking Amoxicillin for the last 8 days secondary to UTI symptoms. Also taking Ibuprofen 800mg   3x a day for the last two months secondary to eye pain following laser eye surgery. While in the ED, labs revealed a WBC of 12.5, Hgb of 11.9 (baseline of 14) and glucose of 314. Abdominal CT showed sensitive colon diverticulosis without acute diverticulitis. She was admitted for further management. Underwent EGD and colonoscopy which showed significant blood throughout without  any bleeding lesion. EGD was unremarkable. GI pathogen panel found to be positive for E. Coli and patient started on Ciprofloxacin. Tagged RBC scan on 03/19/15 did not identify source of bleeding. Ciprofloxacin stopped 03/20/15. Diet advanced 03/20/15 and patient did not experience any further episodes of hematochezia 1/22-1/24.  She did have occasional intermittent fevers during her hospitalization but was afebrile for >24hours prior to discharge.  H/H stable prior to discharge.   Discharge Instructions  Discharge Instructions    Activity as tolerated - No restrictions    Complete by:  As directed      Diet - low sodium heart healthy    Complete by:  As directed      Diet Carb Modified    Complete by:  As directed      Discharge instructions    Complete by:  As directed   Call your physician or seek immediate medical attention for pain, bleeding, fever or worsening of condition.          Current Discharge Medication List    CONTINUE these medications which have NOT CHANGED   Details  ALPRAZolam (XANAX) 0.5 MG tablet Take 0.25 mg by mouth at bedtime as needed for anxiety or sleep.     carvedilol (COREG) 25 MG tablet TAKE 1 TABLET BY MOUTH TWICE DAILY WITH A MEAL Qty: 180 tablet, Refills: 3    docusate sodium (COLACE) 100 MG capsule Take 100 mg by mouth daily.    DULoxetine (CYMBALTA) 30 MG capsule TAKE ONE CAPSULE ONCE DAILY AT BEDTIME Refills: 2    esomeprazole (NEXIUM) 20 MG capsule Take 20 mg by mouth 2 (two) times daily before a meal.    furosemide (LASIX) 20 MG tablet TAKE 1 TABLET BY MOUTH DAILY Qty: 90 tablet, Refills: 3    glyBURIDE (DIABETA) 5 MG tablet Take 5 mg by mouth 2 (two) times daily with a meal.    INVOKANA 300 MG TABS tablet Take 300 mg by mouth daily.     loratadine (CLARITIN) 10 MG tablet Take 10 mg by mouth daily.     montelukast (SINGULAIR) 10 MG tablet Take 10 mg by mouth daily.    nitroGLYCERIN (NITROSTAT) 0.4 MG SL tablet Place 1 tablet (0.4 mg  total) under the tongue every 5 (five) minutes as needed for chest pain. Qty: 25 tablet, Refills: 12    PARoxetine (PAXIL) 30 MG tablet TAKE ONE TABLET BY MOUTH OCNE DAILY AT BEDTIME Refills: 5    pravastatin (PRAVACHOL) 20 MG tablet Take 1 tablet (20 mg total) by mouth every evening. Qty: 90 tablet, Refills: 3    quinapril (ACCUPRIL) 20 MG tablet Take 20 mg by mouth daily.     traMADol (ULTRAM) 50 MG tablet Take 1 tablet (50 mg total) by mouth every 6 (six) hours as needed for moderate pain. Qty: 120 tablet, Refills: 1    TRESIBA FLEXTOUCH 200 UNIT/ML SOPN INJECT 62 UNITS SUBCUTANEOUSLY EVERY DAY AT BEDTIME Refills: 3      STOP taking these medications     amoxicillin-clavulanate (AUGMENTIN) 875-125 MG tablet      aspirin 81 MG EC tablet  ibuprofen (ADVIL,MOTRIN) 800 MG tablet        Allergies  Allergen Reactions  . Demerol [Meperidine] Nausea And Vomiting  . Morphine And Related Nausea And Vomiting  . Promethazine Other (See Comments)    No reaction listed    The results of significant diagnostics from this hospitalization (including imaging, microbiology, ancillary and laboratory) are listed below for reference.    Significant Diagnostic Studies: Nm Gi Blood Loss  03/19/2015  CLINICAL DATA:  Gastrointestinal bleeding for 4 days EXAM: NUCLEAR MEDICINE GASTROINTESTINAL BLEEDING SCAN TECHNIQUE: Sequential abdominal images were obtained following intravenous administration of Tc-59m labeled red blood cells. RADIOPHARMACEUTICALS:  Twenty-eight mCi Tc-85m in-vitro labeled red cells. COMPARISON:  03/16/2015 FINDINGS: Uptake over the heart liver spleen and bladder noted. Vascular uptake noted as anticipated. No abnormal uptake over the abdomen or pelvis to indicate a source of bleeding. IMPRESSION: Negative for detection of source of gastrointestinal hemorrhage. Electronically Signed   By: Esperanza Heir M.D.   On: 03/19/2015 16:18   Ct Abdomen Pelvis W Contrast  03/16/2015   CLINICAL DATA:  Rectal bleeding. Nausea, vomiting, and diarrhea since midnight. EXAM: CT ABDOMEN AND PELVIS WITH CONTRAST TECHNIQUE: Multidetector CT imaging of the abdomen and pelvis was performed using the standard protocol following bolus administration of intravenous contrast. CONTRAST:  Type and amount not provided. COMPARISON:  CT abdomen pelvis dated 08/20/2009. FINDINGS: Lower chest:  No acute findings. Hepatobiliary: No masses or other significant abnormality. Pancreas: No mass, inflammatory changes, or other significant abnormality. Spleen: Within normal limits in size and appearance. Adrenals/Urinary Tract: No masses identified. No evidence of hydronephrosis. Stomach/Bowel: Extensive diverticulosis throughout the colon, most severe within the sigmoid and descending colon, but no focal inflammatory change to suggest acute diverticulitis. Bowel is normal in caliber throughout. No evidence of bowel wall inflammation. Appendix is normal. Vascular/Lymphatic: Scattered atherosclerotic changes of the normal-caliber abdominal aorta. No acute vascular abnormality seen. No enlarged lymph nodes seen. Reproductive: No mass or other significant abnormality. Other: No free fluid or abscess collection. No free intraperitoneal air. Musculoskeletal: Degenerative changes throughout the thoracolumbar spine, moderate in degree, but no acute osseous abnormality. Superficial soft tissues are unremarkable. IMPRESSION: 1. Extensive colonic diverticulosis without evidence of acute diverticulitis. 2. No evidence of acute intra-abdominal or intrapelvic abnormality. No bowel obstruction or evidence of bowel wall inflammation. No free fluid or abscess. No free intraperitoneal air. No evidence of acute solid organ abnormality. Electronically Signed   By: Bary Richard M.D.   On: 03/16/2015 17:58    Microbiology: Recent Results (from the past 240 hour(s))  Gastrointestinal Panel by PCR , Stool     Status: Abnormal   Collection  Time: 03/17/15 11:22 AM  Result Value Ref Range Status   Campylobacter species NOT DETECTED NOT DETECTED Final   Plesimonas shigelloides NOT DETECTED NOT DETECTED Final   Salmonella species NOT DETECTED NOT DETECTED Final   Yersinia enterocolitica NOT DETECTED NOT DETECTED Final   Vibrio species NOT DETECTED NOT DETECTED Final   Vibrio cholerae NOT DETECTED NOT DETECTED Final   Enteroaggregative E coli (EAEC) DETECTED (A) NOT DETECTED Final    Comment: CRITICAL RESULT CALLED TO, READ BACK BY AND VERIFIED WITH: JASON VAUGH,RN 03/18/2015 1441 BY JRS    Enteropathogenic E coli (EPEC) NOT DETECTED NOT DETECTED Final   Enterotoxigenic E coli (ETEC) NOT DETECTED NOT DETECTED Final   Shiga like toxin producing E coli (STEC) NOT DETECTED NOT DETECTED Final   E. coli O157 NOT DETECTED NOT DETECTED Final  Shigella/Enteroinvasive E coli (EIEC) NOT DETECTED NOT DETECTED Final   Cryptosporidium NOT DETECTED NOT DETECTED Final   Cyclospora cayetanensis NOT DETECTED NOT DETECTED Final   Entamoeba histolytica NOT DETECTED NOT DETECTED Final   Giardia lamblia NOT DETECTED NOT DETECTED Final   Adenovirus F40/41 NOT DETECTED NOT DETECTED Final   Astrovirus NOT DETECTED NOT DETECTED Final   Norovirus GI/GII NOT DETECTED NOT DETECTED Final   Rotavirus A NOT DETECTED NOT DETECTED Final   Sapovirus (I, II, IV, and V) NOT DETECTED NOT DETECTED Final  Culture, blood (routine x 2)     Status: None (Preliminary result)   Collection Time: 03/20/15  4:00 PM  Result Value Ref Range Status   Specimen Description BLOOD LEFT HAND  Final   Special Requests BOTTLES DRAWN AEROBIC AND ANAEROBIC 5CC EACH  Final   Culture NO GROWTH 2 DAYS  Final   Report Status PENDING  Incomplete  Culture, blood (routine x 2)     Status: None (Preliminary result)   Collection Time: 03/20/15  4:15 PM  Result Value Ref Range Status   Specimen Description BLOOD RIGHT HAND  Final   Special Requests BOTTLES DRAWN AEROBIC AND ANAEROBIC  4CC EACH  Final   Culture NO GROWTH 2 DAYS  Final   Report Status PENDING  Incomplete  Culture, Urine     Status: None (Preliminary result)   Collection Time: 03/20/15  5:33 PM  Result Value Ref Range Status   Specimen Description URINE, CLEAN CATCH  Final   Special Requests NONE  Final   Culture   Final    TOO YOUNG TO READ Performed at [facility]    Report Status PENDING  Incomplete     Labs: Basic Metabolic Panel:  Recent Labs Lab 03/16/15 1555 03/18/15 0526 03/19/15 0554  NA 134* 139 138  K 4.7 3.8 3.7  CL 101 103 103  CO2 GLUCOSE 314* 153* 214*  BUN CREATININE 0.68 0.55 0.60  CALCIUM 8.6* 8.7* 8.8*   Liver Function Tests:  Recent Labs Lab 03/16/15 1555  AST 15  ALT 15  ALKPHOS 62  BILITOT 0.2*  PROT 6.3*  ALBUMIN 3.5   No results for input(s): LIPASE, AMYLASE in the last 168 hours. No results for input(s): AMMONIA in the last 168 hours. CBC:  Recent Labs Lab 03/16/15 1555  03/18/15 0526  03/18/15 2107 03/19/15 0554 03/19/15 1635 03/20/15 0548 03/20/15 1345 03/21/15 0524  WBC 12.5*  < > 11.8*  --  10.6* 11.2*  --  11.6*  --  10.9*  NEUTROABS 8.3*  --   --   --   --   --   --   --   --   --   HGB 11.9*  < > 8.9*  < > 7.3* 7.8* 7.6* 7.8* 7.8* 8.3*  HCT 35.9*  < > 25.7*  < > 21.5* 23.2* 22.6* 23.5* 23.4* 25.3*  MCV 86.5  < > 86.5  --  86.3 87.9  --  88.7  --  89.1  PLT 316  < > 264  --  222 283  --  303  --  343  < > = values in this interval not displayed. Cardiac Enzymes:  Recent Labs Lab 03/16/15 1655 03/17/15 0424 03/17/15 0830  TROPONINI <0.03 <0.03 <0.03   BNP: BNP (last 3 results) No results for input(s): BNP in the last 8760 hours.  ProBNP (last 3 results) No results for input(s):  PROBNP in the last 8760 hours.  CBG:  Recent Labs Lab 03/21/15 1643 03/21/15 2128 03/22/15 0720 03/22/15 1203 03/22/15 1612  GLUCAP 262* 218* 258* 215* 215*    Principal Problem:   Bloody diarrhea Active  Problems:   DM (diabetes mellitus), type 2, uncontrolled (HCC)   HTN (hypertension)   Fibromyalgia   CAD (coronary artery disease)   Diabetes mellitus without complication (HCC)   HLD (hyperlipidemia)   Morbid obesity (HCC)   Rectal bleed   Abdominal pain   Chronic systolic CHF (congestive heart failure) (HCC)   Acute blood loss anemia   Barrett's esophagus without dysplasia   Esophageal dysphagia   Lower GI bleed   Fever, unspecified   Time coordinating discharge: >30 minutes  Signed:  03/22/2015, 4:59 PM

## 2015-03-22 NOTE — Discharge Instructions (Signed)

## 2015-03-25 ENCOUNTER — Encounter: Payer: Self-pay | Admitting: Adult Health

## 2015-03-25 ENCOUNTER — Encounter: Payer: Self-pay | Admitting: *Deleted

## 2015-03-25 ENCOUNTER — Ambulatory Visit (INDEPENDENT_AMBULATORY_CARE_PROVIDER_SITE_OTHER): Payer: Federal, State, Local not specified - PPO | Admitting: Adult Health

## 2015-03-25 VITALS — BP 116/56 | HR 66 | Ht 68.0 in | Wt 248.0 lb

## 2015-03-25 DIAGNOSIS — I519 Heart disease, unspecified: Secondary | ICD-10-CM | POA: Diagnosis not present

## 2015-03-25 DIAGNOSIS — F418 Other specified anxiety disorders: Secondary | ICD-10-CM | POA: Diagnosis not present

## 2015-03-25 DIAGNOSIS — I10 Essential (primary) hypertension: Secondary | ICD-10-CM | POA: Diagnosis not present

## 2015-03-25 LAB — CULTURE, BLOOD (ROUTINE X 2)
Culture: NO GROWTH
Culture: NO GROWTH
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
## 2015-03-25 NOTE — Progress Notes (Signed)
Name: Vanessa Silva    DOB: 02-16-1961  Age: 55 y.o.  MR#: [id number]       PCP:  [name], MD      Insurance: Payor: BLUE CROSS BLUE SHIELD / Plan: BCBS/[employer] PPO / Product Type: *No Product type* /   CC:    Chief Complaint  Patient presents with  . Cardiomyopathy    VS Filed Vitals:   03/25/15 1303  BP: 116/56  Pulse: 66  Height: 5\' 8"  (1.727 m)  Weight: 248 lb (112.492 kg)  SpO2: 96%    Weights Current Weight  03/25/15 248 lb (112.492 kg)  03/22/15 240 lb 3.2 oz (108.954 kg)  06/15/14 246 lb (111.585 kg)    Blood Pressure  BP Readings from Last 3 Encounters:  03/25/15 116/56  03/22/15 108/60  06/15/14 134/82     Admit date:  (Not on file) Last encounter with RMR:  Visit date not found   Allergy Demerol; Morphine and related; and Promethazine  Current Outpatient Prescriptions  Medication Sig Dispense Refill  . ALPRAZolam (XANAX) 0.5 MG tablet Take 0.25 mg by mouth at bedtime as needed for anxiety or sleep.     . carvedilol (COREG) 25 MG tablet TAKE 1 TABLET BY MOUTH TWICE DAILY WITH A MEAL 180 tablet 3  . docusate sodium (COLACE) 100 MG capsule Take 100 mg by mouth daily.    . DULoxetine (CYMBALTA) 30 MG capsule TAKE ONE CAPSULE ONCE DAILY AT BEDTIME  2  . esomeprazole (NEXIUM) 20 MG capsule Take 20 mg by mouth 2 (two) times daily before a meal.    . furosemide (LASIX) 20 MG tablet TAKE 1 TABLET BY MOUTH DAILY 90 tablet 3  . glyBURIDE (DIABETA) 5 MG tablet Take 5 mg by mouth 2 (two) times daily with a meal.    . INVOKANA 300 MG TABS tablet Take 300 mg by mouth daily.     Marland Kitchen loratadine (CLARITIN) 10 MG tablet Take 10 mg by mouth daily.     . montelukast (SINGULAIR) 10 MG tablet Take 10 mg by mouth daily.    . nitroGLYCERIN (NITROSTAT) 0.4 MG SL tablet Place 1 tablet (0.4 mg total) under the tongue every 5 (five) minutes as needed for chest pain. 25 tablet 12  . PARoxetine (PAXIL) 30 MG tablet TAKE ONE TABLET BY MOUTH OCNE DAILY AT BEDTIME  5   . pravastatin (PRAVACHOL) 20 MG tablet Take 1 tablet (20 mg total) by mouth every evening. 90 tablet 3  . quinapril (ACCUPRIL) 20 MG tablet Take 20 mg by mouth daily.     . traMADol (ULTRAM) 50 MG tablet Take 1 tablet (50 mg total) by mouth every 6 (six) hours as needed for moderate pain. (Patient taking differently: Take 50 mg by mouth 2 (two) times daily. ) 120 tablet 1  . TRESIBA FLEXTOUCH 200 UNIT/ML SOPN INJECT 62 UNITS SUBCUTANEOUSLY EVERY DAY AT BEDTIME  3   No current facility-administered medications for this visit.    Discontinued Meds:   There are no discontinued medications.  Patient Active Problem List   Diagnosis Date Noted  . GI bleed   . Fever, unspecified   . Lower GI bleed   . Acute blood loss anemia   . Barrett's esophagus without dysplasia   . Esophageal dysphagia   . Rectal bleed 03/16/2015  . Abdominal pain 03/16/2015  . Bloody diarrhea 03/16/2015  . Chronic systolic CHF (congestive heart failure) (HCC) 03/16/2015  . HLD (hyperlipidemia) 06/04/2013  .  Morbid obesity (HCC) 06/04/2013  . Chronic combined systolic and diastolic CHF (congestive heart failure) (HCC)   . CAD (coronary artery disease)   . Diabetes mellitus without complication (HCC)   . Hypertension   . Acute systolic heart failure (HCC) 06/03/2013  . Sick-euthyroid syndrome 06/02/2013  . Pulmonary edema 05/31/2013  . DM (diabetes mellitus), type 2, uncontrolled (HCC) 05/31/2013  . Tachycardia 05/31/2013  . Dyspnea 05/31/2013  . HTN (hypertension) 05/31/2013  . Chest discomfort 05/31/2013  . Acute systolic CHF (congestive heart failure) (HCC) 05/31/2013  . Fibromyalgia 05/31/2013    LABS    Component Value Date/Time   NA 138 03/19/2015 0554   NA 139 03/18/2015 0526   NA 134* 03/16/2015 1555   K 3.7 03/19/2015 0554   K 3.8 03/18/2015 0526   K 4.7 03/16/2015 1555   CL 103 03/19/2015 0554   CL 103 03/18/2015 0526   CL 101 03/16/2015 1555   CO2 28 03/19/2015 0554   CO2 28 03/18/2015  0526   CO2 27 03/16/2015 1555   GLUCOSE 214* 03/19/2015 0554   GLUCOSE 153* 03/18/2015 0526   GLUCOSE 314* 03/16/2015 1555   BUN 6 03/19/2015 0554   BUN 13 03/18/2015 0526   BUN 17 03/16/2015 1555   CREATININE 0.60 03/19/2015 0554   CREATININE 0.55 03/18/2015 0526   CREATININE 0.68 03/16/2015 1555   CALCIUM 8.8* 03/19/2015 0554   CALCIUM 8.7* 03/18/2015 0526   CALCIUM 8.6* 03/16/2015 1555   GFRNONAA >60 03/19/2015 0554   GFRNONAA >60 03/18/2015 0526   GFRNONAA >60 03/16/2015 1555   GFRAA >60 03/19/2015 0554   GFRAA >60 03/18/2015 0526   GFRAA >60 03/16/2015 1555   CMP     Component Value Date/Time   NA 138 03/19/2015 0554   K 3.7 03/19/2015 0554   CL 103 03/19/2015 0554   CO2 28 03/19/2015 0554   GLUCOSE 214* 03/19/2015 0554   BUN 6 03/19/2015 0554   CREATININE 0.60 03/19/2015 0554   CALCIUM 8.8* 03/19/2015 0554   PROT 6.3* 03/16/2015 1555   ALBUMIN 3.5 03/16/2015 1555   AST 15 03/16/2015 1555   ALT 15 03/16/2015 1555   ALKPHOS 62 03/16/2015 1555   BILITOT 0.2* 03/16/2015 1555   GFRNONAA >60 03/19/2015 0554   GFRAA >60 03/19/2015 0554       Component Value Date/Time   WBC 10.9* 03/21/2015 0524   WBC 11.6* 03/20/2015 0548   WBC 11.2* 03/19/2015 0554   HGB 8.3* 03/21/2015 0524   HGB 7.8* 03/20/2015 1345   HGB 7.8* 03/20/2015 0548   HCT 25.3* 03/21/2015 0524   HCT 23.4* 03/20/2015 1345   HCT 23.5* 03/20/2015 0548   MCV 89.1 03/21/2015 0524   MCV 88.7 03/20/2015 0548   MCV 87.9 03/19/2015 0554    Lipid Panel     Component Value Date/Time   CHOL 207* 06/01/2013 0503   TRIG 88 06/01/2013 0503   HDL 51 06/01/2013 0503   CHOLHDL 4.1 06/01/2013 0503   VLDL 18 06/01/2013 0503   LDLCALC 138* 06/01/2013 0503    ABG    Component Value Date/Time   PHART 7.296* 06/03/2013 1635   PCO2ART 53.9* 06/03/2013 1635   PO2ART 75.0* 06/03/2013 1635   HCO3 24.5* 06/03/2013 1652   TCO2 26 06/03/2013 1652   ACIDBASEDEF 3.0* 06/03/2013 1652   O2SAT 68.0 06/03/2013 1652      Lab Results  Component Value Date   TSH 8.360* 05/31/2013   BNP (last 3 results) No results for input(s):  BNP in the last 8760 hours.  ProBNP (last 3 results) No results for input(s): PROBNP in the last 8760 hours.  Cardiac Panel (last 3 results) No results for input(s): CKTOTAL, CKMB, TROPONINI, RELINDX in the last 72 hours.  Iron/TIBC/Ferritin/ %Sat No results found for: IRON, TIBC, FERRITIN, IRONPCTSAT   EKG Orders placed or performed in visit on 05/13/14  . EKG 12-Lead     Prior Assessment and Plan Problem List as of 03/25/2015      Cardiovascular and Mediastinum   Chronic combined systolic and diastolic CHF (congestive heart failure) [facility])   Last Assessment & Plan 06/25/2013 Office Visit Written 06/25/2013  4:27 PM by Jodelle Gross, NP    She is doing well and stable I will maximize her coreg dose for optimal medical management. She will see Dr.Branch in one month. She will need to have follow up echo in a couple of months. She is to call us if she becomes symptomatic        HTN (hypertension)   Last Assessment & Plan 06/25/2013 Office Visit Written 06/25/2013  4:28 PM by Jodelle Gross, NP    Blood pressure is controlled but not optimal for systolic function at this point. Will increase the carvedilol for optimal medical management.  She will need to be placed on low dose ACE on next visit.She is staying away from salt.      Acute systolic CHF (congestive heart failure) (HCC)   Acute systolic heart failure (HCC)   CAD (coronary artery disease)   Last Assessment & Plan 06/10/2013 Office Visit Written 06/10/2013  4:37 PM by Jodelle Gross, NP    The patient will continue with risk modification, to include statin therapy aspirin therapy and she has been advised on weight loss and increasing exercise slowly.      Hypertension   Chronic systolic CHF (congestive heart failure) (HCC)     Respiratory   Pulmonary edema     Digestive   Rectal bleed   Bloody  diarrhea   Barrett's esophagus without dysplasia   Esophageal dysphagia   Lower GI bleed   GI bleed     Endocrine   DM (diabetes mellitus), type 2, uncontrolled (HCC)   Sick-euthyroid syndrome   Diabetes mellitus without complication (HCC)     Musculoskeletal and Integument   Fibromyalgia     Other   Tachycardia   Dyspnea   Chest discomfort   HLD (hyperlipidemia)   Morbid obesity (HCC)   Abdominal pain   Acute blood loss anemia   Fever, unspecified       Imaging: Nm Gi Blood Loss  03/19/2015  CLINICAL DATA:  Gastrointestinal bleeding for 4 days EXAM: NUCLEAR MEDICINE GASTROINTESTINAL BLEEDING SCAN TECHNIQUE: Sequential abdominal images were obtained following intravenous administration of Tc-74m labeled red blood cells. RADIOPHARMACEUTICALS:  Twenty-eight mCi Tc-94m in-vitro labeled red cells. COMPARISON:  03/16/2015 FINDINGS: Uptake over the heart liver spleen and bladder noted. Vascular uptake noted as anticipated. No abnormal uptake over the abdomen or pelvis to indicate a source of bleeding. IMPRESSION: Negative for detection of source of gastrointestinal hemorrhage. Electronically Signed   By: Esperanza Heir M.D.   On: 03/19/2015 16:18   Ct Abdomen Pelvis W Contrast  03/16/2015  CLINICAL DATA:  Rectal bleeding. Nausea, vomiting, and diarrhea since midnight. EXAM: CT ABDOMEN AND PELVIS WITH CONTRAST TECHNIQUE: Multidetector CT imaging of the abdomen and pelvis was performed using the standard protocol following bolus administration of intravenous contrast. CONTRAST:  Type and amount  not provided. COMPARISON:  CT abdomen pelvis dated 08/20/2009. FINDINGS: Lower chest:  No acute findings. Hepatobiliary: No masses or other significant abnormality. Pancreas: No mass, inflammatory changes, or other significant abnormality. Spleen: Within normal limits in size and appearance. Adrenals/Urinary Tract: No masses identified. No evidence of hydronephrosis. Stomach/Bowel: Extensive  diverticulosis throughout the colon, most severe within the sigmoid and descending colon, but no focal inflammatory change to suggest acute diverticulitis. Bowel is normal in caliber throughout. No evidence of bowel wall inflammation. Appendix is normal. Vascular/Lymphatic: Scattered atherosclerotic changes of the normal-caliber abdominal aorta. No acute vascular abnormality seen. No enlarged lymph nodes seen. Reproductive: No mass or other significant abnormality. Other: No free fluid or abscess collection. No free intraperitoneal air. Musculoskeletal: Degenerative changes throughout the thoracolumbar spine, moderate in degree, but no acute osseous abnormality. Superficial soft tissues are unremarkable. IMPRESSION: 1. Extensive colonic diverticulosis without evidence of acute diverticulitis. 2. No evidence of acute intra-abdominal or intrapelvic abnormality. No bowel obstruction or evidence of bowel wall inflammation. No free fluid or abscess. No free intraperitoneal air. No evidence of acute solid organ abnormality. Electronically Signed   By: Bary Richard M.D.   On: 03/16/2015 17:58

## 2015-03-25 NOTE — Patient Instructions (Signed)
Your physician wants you to follow-up in: 4 Months. You will receive a reminder letter in the mail two months in advance. If you don't receive a letter, please call our office to schedule the follow-up appointment.  Your physician recommends that you continue on your current medications as directed. Please refer to the Current Medication list given to you today.  Your physician has requested that you have an echocardiogram. Echocardiography is a painless test that uses sound waves to create images of your heart. It provides your doctor with information about the size and shape of your heart and how well your heart's chambers and valves are working. This procedure takes approximately one hour. There are no restrictions for this procedure.  If you need a refill on your cardiac medications before your next appointment, please call your pharmacy.  Thank you for choosing [facility]!

## 2015-03-27 ENCOUNTER — Encounter: Payer: Self-pay | Admitting: Internal Medicine

## 2015-04-18 ENCOUNTER — Ambulatory Visit: Payer: Federal, State, Local not specified - PPO | Admitting: Gastroenterology

## 2015-04-26 ENCOUNTER — Other Ambulatory Visit (HOSPITAL_COMMUNITY): Payer: Federal, State, Local not specified - PPO

## 2015-04-28 ENCOUNTER — Telehealth: Payer: Self-pay | Admitting: Cardiology

## 2015-04-28 ENCOUNTER — Encounter: Payer: Federal, State, Local not specified - PPO | Admitting: Cardiology

## 2015-04-28 ENCOUNTER — Encounter: Payer: Self-pay | Admitting: Cardiology
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
## 2015-04-28 NOTE — Telephone Encounter (Signed)
Error

## 2015-05-29 ENCOUNTER — Other Ambulatory Visit: Payer: Self-pay | Admitting: Cardiology

## 2015-06-10 ENCOUNTER — Other Ambulatory Visit: Payer: Self-pay | Admitting: Cardiology

## 2015-06-16 DIAGNOSIS — D500 Iron deficiency anemia secondary to blood loss (chronic): Secondary | ICD-10-CM | POA: Diagnosis not present

## 2015-07-04 ENCOUNTER — Encounter: Payer: Self-pay | Admitting: Cardiology

## 2015-07-26 ENCOUNTER — Encounter: Payer: Self-pay | Admitting: Cardiology

## 2015-08-09 ENCOUNTER — Ambulatory Visit: Payer: Federal, State, Local not specified - PPO | Admitting: Cardiology

## 2015-08-15 ENCOUNTER — Other Ambulatory Visit: Payer: Self-pay

## 2015-08-15 ENCOUNTER — Other Ambulatory Visit: Payer: Self-pay | Admitting: Cardiology

## 2015-08-15 NOTE — Telephone Encounter (Signed)
Pt cx last apt,given 30 day supply,needs apt

## 2015-08-15 NOTE — Telephone Encounter (Signed)
Pt needs fu apt for refills,cx apt with Dr Wyline Mood

## 2015-08-23 DIAGNOSIS — K080 Exfoliation of teeth due to systemic causes: Secondary | ICD-10-CM | POA: Diagnosis not present

## 2015-09-17 ENCOUNTER — Encounter (HOSPITAL_COMMUNITY): Payer: Self-pay | Admitting: Emergency Medicine

## 2015-09-17 ENCOUNTER — Emergency Department (HOSPITAL_COMMUNITY): Payer: Federal, State, Local not specified - PPO

## 2015-09-17 ENCOUNTER — Emergency Department (HOSPITAL_COMMUNITY)
Admission: EM | Admit: 2015-09-17 | Discharge: 2015-09-17 | Disposition: A | Discharge status: Home / Self Care | Payer: Federal, State, Local not specified - PPO | Attending: Emergency Medicine | Admitting: Emergency Medicine

## 2015-09-17 DIAGNOSIS — E119 Type 2 diabetes mellitus without complications: Secondary | ICD-10-CM | POA: Diagnosis not present

## 2015-09-17 DIAGNOSIS — I2510 Atherosclerotic heart disease of native coronary artery without angina pectoris: Secondary | ICD-10-CM | POA: Diagnosis not present

## 2015-09-17 DIAGNOSIS — I110 Hypertensive heart disease with heart failure: Secondary | ICD-10-CM | POA: Insufficient documentation

## 2015-09-17 DIAGNOSIS — Z7984 Long term (current) use of oral hypoglycemic drugs: Secondary | ICD-10-CM | POA: Insufficient documentation

## 2015-09-17 DIAGNOSIS — F329 Major depressive disorder, single episode, unspecified: Secondary | ICD-10-CM | POA: Diagnosis not present

## 2015-09-17 DIAGNOSIS — Z6839 Body mass index (BMI) 39.0-39.9, adult: Secondary | ICD-10-CM | POA: Diagnosis not present

## 2015-09-17 DIAGNOSIS — Z7982 Long term (current) use of aspirin: Secondary | ICD-10-CM | POA: Insufficient documentation

## 2015-09-17 DIAGNOSIS — M25571 Pain in right ankle and joints of right foot: Secondary | ICD-10-CM | POA: Diagnosis not present

## 2015-09-17 DIAGNOSIS — Z79899 Other long term (current) drug therapy: Secondary | ICD-10-CM | POA: Diagnosis not present

## 2015-09-17 DIAGNOSIS — E785 Hyperlipidemia, unspecified: Secondary | ICD-10-CM | POA: Diagnosis not present

## 2015-09-17 DIAGNOSIS — I5042 Chronic combined systolic (congestive) and diastolic (congestive) heart failure: Secondary | ICD-10-CM | POA: Insufficient documentation

## 2015-09-17 DIAGNOSIS — M79672 Pain in left foot: Secondary | ICD-10-CM | POA: Diagnosis not present

## 2015-09-17 DIAGNOSIS — M79671 Pain in right foot: Secondary | ICD-10-CM

## 2015-09-17 DIAGNOSIS — M25572 Pain in left ankle and joints of left foot: Secondary | ICD-10-CM | POA: Diagnosis not present

## 2015-09-17 LAB — CBC WITH DIFFERENTIAL/PLATELET
Basophils Absolute: 0 10*3/uL (ref 0.0–0.1)
Basophils Relative: 0 %
EOS ABS: 0.1 10*3/uL (ref 0.0–0.7)
Eosinophils Relative: 1 %
HEMATOCRIT: 41.3 % (ref 36.0–46.0)
HEMOGLOBIN: 13.3 g/dL (ref 12.0–15.0)
LYMPHS ABS: 3.4 10*3/uL (ref 0.7–4.0)
LYMPHS PCT: 38 %
MCH: 27.5 pg (ref 26.0–34.0)
MCHC: 32.2 g/dL (ref 30.0–36.0)
MCV: 85.3 fL (ref 78.0–100.0)
MONOS PCT: 6 %
Monocytes Absolute: 0.6 10*3/uL (ref 0.1–1.0)
NEUTROS ABS: 5 10*3/uL (ref 1.7–7.7)
NEUTROS PCT: 55 %
Platelets: 275 10*3/uL (ref 150–400)
RBC: 4.84 MIL/uL (ref 3.87–5.11)
RDW: 14.5 % (ref 11.5–15.5)
WBC: 9.1 10*3/uL (ref 4.0–10.5)

## 2015-09-17 LAB — BASIC METABOLIC PANEL
Anion gap: 4 — ABNORMAL LOW (ref 5–15)
BUN: 13 mg/dL (ref 6–20)
CHLORIDE: 106 mmol/L (ref 101–111)
CO2: 26 mmol/L (ref 22–32)
CREATININE: 0.48 mg/dL (ref 0.44–1.00)
Calcium: 9 mg/dL (ref 8.9–10.3)
GFR calc Af Amer: >60 mL/min (ref >60)
GFR calc non Af Amer: >60 mL/min (ref >60)
Glucose, Bld: 136 mg/dL — ABNORMAL HIGH (ref 65–99)
POTASSIUM: 3.7 mmol/L (ref 3.5–5.1)
SODIUM: 136 mmol/L (ref 135–145)

## 2015-09-17 MED ORDER — GABAPENTIN 100 MG PO CAPS: 100.0000 mg | ORAL_CAPSULE | Freq: Three times a day (TID) | ORAL | Status: DC

## 2015-09-17 MED ORDER — HYDROCODONE-ACETAMINOPHEN 5-325 MG PO TABS
1.0000 | ORAL_TABLET | Freq: Once | ORAL | Status: AC
  Administered 2015-09-17: 1 via ORAL
  Filled 2015-09-17: qty 1

## 2015-09-17 NOTE — ED Notes (Signed)
Pt taken to XR.  

## 2015-09-17 NOTE — ED Notes (Signed)
Pt made aware to return if symptoms worsen or if any life threatening symptoms occur.   

## 2015-09-17 NOTE — ED Notes (Signed)
Having left foot pain since July 13 th, denies injury.  Tried several home treatment: soaking, elevated, massaged and heat.  C/o numbness and tingling.  C/o feeling swollen of entire body.

## 2015-09-17 NOTE — ED Notes (Signed)
Tammy, PA at bedside.  
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
## 2015-09-23 DIAGNOSIS — Z6839 Body mass index (BMI) 39.0-39.9, adult: Secondary | ICD-10-CM | POA: Diagnosis not present

## 2015-09-23 DIAGNOSIS — E669 Obesity, unspecified: Secondary | ICD-10-CM | POA: Diagnosis not present

## 2015-09-23 DIAGNOSIS — E1165 Type 2 diabetes mellitus with hyperglycemia: Secondary | ICD-10-CM | POA: Diagnosis not present

## 2015-09-23 DIAGNOSIS — Z1389 Encounter for screening for other disorder: Secondary | ICD-10-CM | POA: Diagnosis not present

## 2015-09-23 DIAGNOSIS — E782 Mixed hyperlipidemia: Secondary | ICD-10-CM | POA: Diagnosis not present

## 2015-09-23 DIAGNOSIS — I10 Essential (primary) hypertension: Secondary | ICD-10-CM | POA: Diagnosis not present

## 2015-09-26 ENCOUNTER — Other Ambulatory Visit: Payer: Self-pay

## 2015-09-26 ENCOUNTER — Other Ambulatory Visit: Payer: Self-pay | Admitting: *Deleted

## 2015-09-26 MED ORDER — CARVEDILOL 25 MG PO TABS: 25.0000 mg | ORAL_TABLET | Freq: Two times a day (BID) | ORAL | 0 refills | Status: DC

## 2015-09-26 NOTE — Telephone Encounter (Signed)
Pt was no show march apt,cx June apt and wants refills coreg.Given #30 and [facility]. [facility] back and requests 3 month supply,per office protocol pt has to make her apt and will have rx filled then

## 2015-09-30 DIAGNOSIS — E119 Type 2 diabetes mellitus without complications: Secondary | ICD-10-CM | POA: Diagnosis not present

## 2015-10-06 DIAGNOSIS — K080 Exfoliation of teeth due to systemic causes: Secondary | ICD-10-CM | POA: Diagnosis not present

## 2015-10-13 ENCOUNTER — Encounter: Payer: Self-pay | Admitting: Adult Health

## 2015-10-13 ENCOUNTER — Ambulatory Visit (INDEPENDENT_AMBULATORY_CARE_PROVIDER_SITE_OTHER): Payer: Federal, State, Local not specified - PPO | Admitting: Adult Health

## 2015-10-13 VITALS — BP 102/60 | HR 86 | Ht 68.0 in | Wt 256.0 lb

## 2015-10-13 DIAGNOSIS — I5021 Acute systolic (congestive) heart failure: Secondary | ICD-10-CM

## 2015-10-13 DIAGNOSIS — I10 Essential (primary) hypertension: Secondary | ICD-10-CM | POA: Diagnosis not present

## 2015-10-13 DIAGNOSIS — E7800 Pure hypercholesterolemia, unspecified: Secondary | ICD-10-CM | POA: Diagnosis not present

## 2015-10-13 DIAGNOSIS — R0789 Other chest pain: Secondary | ICD-10-CM | POA: Diagnosis not present

## 2015-10-13 NOTE — Patient Instructions (Signed)

## 2015-10-13 NOTE — Progress Notes (Signed)
Name: Vanessa Silva    DOB: 01-07-1961  Age: 54 y.o.  MR#: [id number]       PCP:  [name], MD      Insurance: Payor: BLUE CROSS BLUE SHIELD / Plan: BCBS/FEDER[employer] PPO / Product Type: *No Product type* /   CC:   No chief complaint on file.   VS Vitals:   10/13/15 1545  Weight: 256 lb (116.1 kg)  Height: 5\' 8"  (1.727 m)    Weights Current Weight  10/13/15 256 lb (116.1 kg)  09/17/15 258 lb (117 kg)  03/25/15 248 lb (112.5 kg)    Blood Pressure  BP Readings from Last 3 Encounters:  09/17/15 116/56  03/25/15 (!) 116/56  03/22/15 108/60     Admit date:  (Not on file) Last encounter with RMR:  Visit date not found   Allergy Demerol [meperidine]; Morphine and related; and Promethazine  Current Outpatient Prescriptions  Medication Sig Dispense Refill  . ALPRAZolam (XANAX) 0.5 MG tablet Take 0.25 mg by mouth at bedtime as needed for anxiety or sleep.     Marland Kitchen aspirin EC 81 MG tablet Take 81 mg by mouth daily.    . carvedilol (COREG) 25 MG tablet Take 1 tablet (25 mg total) by mouth 2 (two) times daily with a meal. 30 tablet 0  . docusate sodium (COLACE) 100 MG capsule Take 100 mg by mouth daily.    . DULoxetine (CYMBALTA) 30 MG capsule TAKE ONE CAPSULE ONCE DAILY AT BEDTIME  2  . esomeprazole (NEXIUM) 20 MG capsule Take 20 mg by mouth 2 (two) times daily before a meal.    . furosemide (LASIX) 20 MG tablet TAKE 1 TABLET BY MOUTH DAILY 90 tablet 3  . gabapentin (NEURONTIN) 100 MG capsule Take 200 mg by mouth 2 (two) times daily.    Marland Kitchen glyBURIDE (DIABETA) 5 MG tablet Take 5 mg by mouth 2 (two) times daily with a meal.    . INVOKANA 300 MG TABS tablet Take 300 mg by mouth daily.     . Liraglutide (VICTOZA) 18 MG/3ML SOPN Inject into the skin daily.    Marland Kitchen loratadine (CLARITIN) 10 MG tablet Take 10 mg by mouth daily.     . montelukast (SINGULAIR) 10 MG tablet Take 10 mg by mouth daily.    . nitroGLYCERIN (NITROSTAT) 0.4 MG SL tablet Place 1 tablet (0.4 mg total) under  the tongue every 5 (five) minutes as needed for chest pain. 25 tablet 12  . PARoxetine (PAXIL) 40 MG tablet Take 40 mg by mouth daily.    . pravastatin (PRAVACHOL) 20 MG tablet TAKE 1 TABLET(20 MG) BY MOUTH EVERY EVENING 90 tablet 3  . quinapril (ACCUPRIL) 20 MG tablet Take 20 mg by mouth daily.     . traMADol (ULTRAM) 50 MG tablet Take 1 tablet (50 mg total) by mouth every 6 (six) hours as needed for moderate pain. (Patient taking differently: Take 50 mg by mouth 2 (two) times daily. ) 120 tablet 1  . TRESIBA FLEXTOUCH 200 UNIT/ML SOPN INJECT 68 UNITS SUBCUTANEOUSLY EVERY DAY AT BEDTIME  3   No current facility-administered medications for this visit.     Discontinued Meds:    Medications Discontinued During This Encounter  Medication Reason  . gabapentin (NEURONTIN) 100 MG capsule Error    Patient Active Problem List   Diagnosis Date Noted  . GI bleed   . Fever, unspecified   . Lower GI bleed   . Acute blood loss  anemia   . Barrett's esophagus without dysplasia   . Esophageal dysphagia   . Rectal bleed 03/16/2015  . Abdominal pain 03/16/2015  . Bloody diarrhea 03/16/2015  . Chronic systolic CHF (congestive heart failure) (HCC) 03/16/2015  . HLD (hyperlipidemia) 06/04/2013  . Morbid obesity (HCC) 06/04/2013  . Chronic combined systolic and diastolic CHF (congestive heart failure) (HCC)   . CAD (coronary artery disease)   . Diabetes mellitus without complication (HCC)   . Hypertension   . Acute systolic heart failure (HCC) 06/03/2013  . Sick-euthyroid syndrome 06/02/2013  . Pulmonary edema 05/31/2013  . DM (diabetes mellitus), type 2, uncontrolled (HCC) 05/31/2013  . Tachycardia 05/31/2013  . Dyspnea 05/31/2013  . HTN (hypertension) 05/31/2013  . Chest discomfort 05/31/2013  . Acute systolic CHF (congestive heart failure) (HCC) 05/31/2013  . Fibromyalgia 05/31/2013    LABS    Component Value Date/Time   NA 136 09/17/2015 1354   NA 138 03/19/2015 0554   NA 139  03/18/2015 0526   K 3.7 09/17/2015 1354   K 3.7 03/19/2015 0554   K 3.8 03/18/2015 0526   CL 106 09/17/2015 1354   CL 103 03/19/2015 0554   CL 103 03/18/2015 0526   CO2 26 09/17/2015 1354   CO2 28 03/19/2015 0554   CO2 28 03/18/2015 0526   GLUCOSE 136 (H) 09/17/2015 1354   GLUCOSE 214 (H) 03/19/2015 0554   GLUCOSE 153 (H) 03/18/2015 0526   BUN 13 09/17/2015 1354   BUN 6 03/19/2015 0554   BUN 13 03/18/2015 0526   CREATININE 0.48 09/17/2015 1354   CREATININE 0.60 03/19/2015 0554   CREATININE 0.55 03/18/2015 0526   CALCIUM 9.0 09/17/2015 1354   CALCIUM 8.8 (L) 03/19/2015 0554   CALCIUM 8.7 (L) 03/18/2015 0526   GFRNONAA >60 09/17/2015 1354   GFRNONAA >60 03/19/2015 0554   GFRNONAA >60 03/18/2015 0526   GFRAA >60 09/17/2015 1354   GFRAA >60 03/19/2015 0554   GFRAA >60 03/18/2015 0526   CMP     Component Value Date/Time   NA 136 09/17/2015 1354   K 3.7 09/17/2015 1354   CL 106 09/17/2015 1354   CO2 26 09/17/2015 1354   GLUCOSE 136 (H) 09/17/2015 1354   BUN 13 09/17/2015 1354   CREATININE 0.48 09/17/2015 1354   CALCIUM 9.0 09/17/2015 1354   PROT 6.3 (L) 03/16/2015 1555   ALBUMIN 3.5 03/16/2015 1555   AST 15 03/16/2015 1555   ALT 15 03/16/2015 1555   ALKPHOS 62 03/16/2015 1555   BILITOT 0.2 (L) 03/16/2015 1555   GFRNONAA >60 09/17/2015 1354   GFRAA >60 09/17/2015 1354       Component Value Date/Time   WBC 9.1 09/17/2015 1354   WBC 10.9 (H) 03/21/2015 0524   WBC 11.6 (H) 03/20/2015 0548   HGB 13.3 09/17/2015 1354   HGB 8.3 (L) 03/21/2015 0524   HGB 7.8 (L) 03/20/2015 1345   HCT 41.3 09/17/2015 1354   HCT 25.3 (L) 03/21/2015 0524   HCT 23.4 (L) 03/20/2015 1345   MCV 85.3 09/17/2015 1354   MCV 89.1 03/21/2015 0524   MCV 88.7 03/20/2015 0548    Lipid Panel     Component Value Date/Time   CHOL 207 (H) 06/01/2013 0503   TRIG 88 06/01/2013 0503   HDL 51 06/01/2013 0503   CHOLHDL 4.1 06/01/2013 0503   VLDL 18 06/01/2013 0503   LDLCALC 138 (H) 06/01/2013 0503     ABG    Component Value Date/Time   PHART 7.296 (L)  06/03/2013 1635   PCO2ART 53.9 (H) 06/03/2013 1635   PO2ART 75.0 (L) 06/03/2013 1635   HCO3 24.5 (H) 06/03/2013 1652   TCO2 26 06/03/2013 1652   ACIDBASEDEF 3.0 (H) 06/03/2013 1652   O2SAT 68.0 06/03/2013 1652     Lab Results  Component Value Date   TSH 8.360 (H) 05/31/2013   BNP (last 3 results) No results for input(s): BNP in the last 8760 hours.  ProBNP (last 3 results) No results for input(s): PROBNP in the last 8760 hours.  Cardiac Panel (last 3 results) No results for input(s): CKTOTAL, CKMB, TROPONINI, RELINDX in the last 72 hours.  Iron/TIBC/Ferritin/ %Sat No results found for: IRON, TIBC, FERRITIN, IRONPCTSAT   EKG Orders placed or performed in visit on 10/13/15  . EKG 12-Lead     Prior Assessment and Plan Problem List as of 10/13/2015 Reviewed: 03/25/2015  1:33 PM by Joni Reining, NP     Cardiovascular and Mediastinum   Chronic combined systolic and diastolic CHF (congestive heart failure) [facility])   Last Assessment & Plan 06/25/2013 Office Visit Written 06/25/2013  4:27 PM by Jodelle Gross, NP    She is doing well and stable I will maximize her coreg dose for optimal medical management. She will see Dr.Branch in one month. She will need to have follow up echo in a couple of months. She is to call us if she becomes symptomatic        HTN (hypertension)   Last Assessment & Plan 06/25/2013 Office Visit Written 06/25/2013  4:28 PM by Jodelle Gross, NP    Blood pressure is controlled but not optimal for systolic function at this point. Will increase the carvedilol for optimal medical management.  She will need to be placed on low dose ACE on next visit.She is staying away from salt.      Acute systolic CHF (congestive heart failure) (HCC)   Acute systolic heart failure (HCC)   CAD (coronary artery disease)   Last Assessment & Plan 06/10/2013 Office Visit Written 06/10/2013  4:37 PM by Jodelle Gross, NP    The patient will continue with risk modification, to include statin therapy aspirin therapy and she has been advised on weight loss and increasing exercise slowly.      Hypertension   Chronic systolic CHF (congestive heart failure) (HCC)     Respiratory   Pulmonary edema     Digestive   Rectal bleed   Bloody diarrhea   Barrett's esophagus without dysplasia   Esophageal dysphagia   Lower GI bleed   GI bleed     Endocrine   DM (diabetes mellitus), type 2, uncontrolled (HCC)   Sick-euthyroid syndrome   Diabetes mellitus without complication (HCC)     Musculoskeletal and Integument   Fibromyalgia     Other   Tachycardia   Dyspnea   Chest discomfort   HLD (hyperlipidemia)   Morbid obesity (HCC)   Abdominal pain   Acute blood loss anemia   Fever, unspecified       Imaging: Dg Ankle Complete Left  Result Date: 09/17/2015 CLINICAL DATA:  Patient with left ankle and foot pain. No known injury. History of fibromyalgia. EXAM: LEFT ANKLE COMPLETE - 3+ VIEW COMPARISON:  None. FINDINGS: Mild degenerative changes about the ankle joint and midfoot. Plantar calcaneal spurring. Normal anatomic alignment. No evidence for acute fracture or dislocation. Regional soft tissues are unremarkable. IMPRESSION: No acute osseous abnormality. Electronically Signed   By: Francis Gaines.D.  On: 09/17/2015 14:03   Dg Foot Complete Left  Result Date: 09/17/2015 CLINICAL DATA:  Patient with ankle and foot pain. No known injury. History of fibromyalgia. EXAM: LEFT FOOT - COMPLETE 3+ VIEW COMPARISON:  None. FINDINGS: Normal anatomic alignment. No evidence for acute fracture or dislocation. Midfoot degenerative changes. First MTP and IP joint degenerative changes. Regional soft tissues are unremarkable. Plantar calcaneal spurring. IMPRESSION: No acute osseous abnormality. Degenerative changes. Electronically Signed   By: Annia Beltrew  Davis M.D.   On: 09/17/2015 14:04
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
## 2015-11-01 ENCOUNTER — Inpatient Hospital Stay (HOSPITAL_COMMUNITY)
Admission: EM | Admit: 2015-11-01 | Discharge: 2015-11-04 | DRG: 065 | Disposition: A | Discharge status: Home / Self Care | Payer: Federal, State, Local not specified - PPO | Attending: Family Medicine | Admitting: Family Medicine

## 2015-11-01 ENCOUNTER — Encounter (HOSPITAL_COMMUNITY): Payer: Self-pay | Admitting: *Deleted

## 2015-11-01 DIAGNOSIS — Z823 Family history of stroke: Secondary | ICD-10-CM | POA: Diagnosis not present

## 2015-11-01 DIAGNOSIS — R29818 Other symptoms and signs involving the nervous system: Secondary | ICD-10-CM | POA: Diagnosis not present

## 2015-11-01 DIAGNOSIS — Z7982 Long term (current) use of aspirin: Secondary | ICD-10-CM | POA: Diagnosis not present

## 2015-11-01 DIAGNOSIS — F329 Major depressive disorder, single episode, unspecified: Secondary | ICD-10-CM | POA: Diagnosis not present

## 2015-11-01 DIAGNOSIS — E1165 Type 2 diabetes mellitus with hyperglycemia: Secondary | ICD-10-CM | POA: Diagnosis present

## 2015-11-01 DIAGNOSIS — E1140 Type 2 diabetes mellitus with diabetic neuropathy, unspecified: Secondary | ICD-10-CM | POA: Diagnosis present

## 2015-11-01 DIAGNOSIS — E118 Type 2 diabetes mellitus with unspecified complications: Secondary | ICD-10-CM | POA: Diagnosis not present

## 2015-11-01 DIAGNOSIS — I5042 Chronic combined systolic (congestive) and diastolic (congestive) heart failure: Secondary | ICD-10-CM | POA: Diagnosis present

## 2015-11-01 DIAGNOSIS — I639 Cerebral infarction, unspecified: Secondary | ICD-10-CM | POA: Diagnosis not present

## 2015-11-01 DIAGNOSIS — I2510 Atherosclerotic heart disease of native coronary artery without angina pectoris: Secondary | ICD-10-CM | POA: Diagnosis present

## 2015-11-01 DIAGNOSIS — R200 Anesthesia of skin: Secondary | ICD-10-CM

## 2015-11-01 DIAGNOSIS — E669 Obesity, unspecified: Secondary | ICD-10-CM | POA: Diagnosis present

## 2015-11-01 DIAGNOSIS — Z825 Family history of asthma and other chronic lower respiratory diseases: Secondary | ICD-10-CM | POA: Diagnosis not present

## 2015-11-01 DIAGNOSIS — G8191 Hemiplegia, unspecified affecting right dominant side: Secondary | ICD-10-CM | POA: Diagnosis present

## 2015-11-01 DIAGNOSIS — K219 Gastro-esophageal reflux disease without esophagitis: Secondary | ICD-10-CM | POA: Diagnosis not present

## 2015-11-01 DIAGNOSIS — Z7984 Long term (current) use of oral hypoglycemic drugs: Secondary | ICD-10-CM

## 2015-11-01 DIAGNOSIS — Z6839 Body mass index (BMI) 39.0-39.9, adult: Secondary | ICD-10-CM | POA: Diagnosis not present

## 2015-11-01 DIAGNOSIS — R202 Paresthesia of skin: Secondary | ICD-10-CM

## 2015-11-01 DIAGNOSIS — Z833 Family history of diabetes mellitus: Secondary | ICD-10-CM

## 2015-11-01 DIAGNOSIS — Z8249 Family history of ischemic heart disease and other diseases of the circulatory system: Secondary | ICD-10-CM | POA: Diagnosis not present

## 2015-11-01 DIAGNOSIS — E785 Hyperlipidemia, unspecified: Secondary | ICD-10-CM | POA: Diagnosis present

## 2015-11-01 DIAGNOSIS — I6602 Occlusion and stenosis of left middle cerebral artery: Secondary | ICD-10-CM | POA: Diagnosis not present

## 2015-11-01 DIAGNOSIS — I110 Hypertensive heart disease with heart failure: Secondary | ICD-10-CM | POA: Diagnosis present

## 2015-11-01 DIAGNOSIS — M797 Fibromyalgia: Secondary | ICD-10-CM | POA: Diagnosis present

## 2015-11-01 DIAGNOSIS — I10 Essential (primary) hypertension: Secondary | ICD-10-CM | POA: Diagnosis not present

## 2015-11-01 DIAGNOSIS — R9431 Abnormal electrocardiogram [ECG] [EKG]: Secondary | ICD-10-CM | POA: Diagnosis not present

## 2015-11-01 DIAGNOSIS — F22 Delusional disorders: Secondary | ICD-10-CM

## 2015-11-01 DIAGNOSIS — IMO0002 Reserved for concepts with insufficient information to code with codable children: Secondary | ICD-10-CM | POA: Diagnosis present

## 2015-11-01 DIAGNOSIS — I6522 Occlusion and stenosis of left carotid artery: Secondary | ICD-10-CM | POA: Diagnosis not present

## 2015-11-01 DIAGNOSIS — Z832 Family history of diseases of the blood and blood-forming organs and certain disorders involving the immune mechanism: Secondary | ICD-10-CM | POA: Diagnosis not present

## 2015-11-01 NOTE — ED Triage Notes (Signed)
Pt c/o numbness to right arm; pt states she was having difficulty holding on to items and states she felt like she couldn't get her words out; pt states the symptoms started x 20 mins pta

## 2015-11-02 ENCOUNTER — Emergency Department (HOSPITAL_COMMUNITY): Payer: Federal, State, Local not specified - PPO

## 2015-11-02 ENCOUNTER — Observation Stay (HOSPITAL_COMMUNITY): Payer: Federal, State, Local not specified - PPO

## 2015-11-02 ENCOUNTER — Encounter (HOSPITAL_COMMUNITY): Payer: Self-pay

## 2015-11-02 DIAGNOSIS — I5042 Chronic combined systolic (congestive) and diastolic (congestive) heart failure: Secondary | ICD-10-CM | POA: Diagnosis not present

## 2015-11-02 DIAGNOSIS — Z832 Family history of diseases of the blood and blood-forming organs and certain disorders involving the immune mechanism: Secondary | ICD-10-CM | POA: Diagnosis not present

## 2015-11-02 DIAGNOSIS — M797 Fibromyalgia: Secondary | ICD-10-CM

## 2015-11-02 DIAGNOSIS — I6602 Occlusion and stenosis of left middle cerebral artery: Secondary | ICD-10-CM | POA: Diagnosis not present

## 2015-11-02 DIAGNOSIS — R29818 Other symptoms and signs involving the nervous system: Secondary | ICD-10-CM | POA: Diagnosis not present

## 2015-11-02 DIAGNOSIS — I110 Hypertensive heart disease with heart failure: Secondary | ICD-10-CM | POA: Diagnosis present

## 2015-11-02 DIAGNOSIS — E785 Hyperlipidemia, unspecified: Secondary | ICD-10-CM | POA: Diagnosis present

## 2015-11-02 DIAGNOSIS — E669 Obesity, unspecified: Secondary | ICD-10-CM | POA: Diagnosis present

## 2015-11-02 DIAGNOSIS — Z7984 Long term (current) use of oral hypoglycemic drugs: Secondary | ICD-10-CM | POA: Diagnosis not present

## 2015-11-02 DIAGNOSIS — Z6839 Body mass index (BMI) 39.0-39.9, adult: Secondary | ICD-10-CM | POA: Diagnosis not present

## 2015-11-02 DIAGNOSIS — E1165 Type 2 diabetes mellitus with hyperglycemia: Secondary | ICD-10-CM | POA: Diagnosis present

## 2015-11-02 DIAGNOSIS — Z825 Family history of asthma and other chronic lower respiratory diseases: Secondary | ICD-10-CM | POA: Diagnosis not present

## 2015-11-02 DIAGNOSIS — R202 Paresthesia of skin: Secondary | ICD-10-CM | POA: Diagnosis not present

## 2015-11-02 DIAGNOSIS — Z7982 Long term (current) use of aspirin: Secondary | ICD-10-CM | POA: Diagnosis not present

## 2015-11-02 DIAGNOSIS — R200 Anesthesia of skin: Secondary | ICD-10-CM | POA: Diagnosis present

## 2015-11-02 DIAGNOSIS — R9431 Abnormal electrocardiogram [ECG] [EKG]: Secondary | ICD-10-CM | POA: Diagnosis not present

## 2015-11-02 DIAGNOSIS — E118 Type 2 diabetes mellitus with unspecified complications: Secondary | ICD-10-CM | POA: Diagnosis not present

## 2015-11-02 DIAGNOSIS — E1140 Type 2 diabetes mellitus with diabetic neuropathy, unspecified: Secondary | ICD-10-CM | POA: Diagnosis present

## 2015-11-02 DIAGNOSIS — F329 Major depressive disorder, single episode, unspecified: Secondary | ICD-10-CM | POA: Diagnosis present

## 2015-11-02 DIAGNOSIS — Z8249 Family history of ischemic heart disease and other diseases of the circulatory system: Secondary | ICD-10-CM | POA: Diagnosis not present

## 2015-11-02 DIAGNOSIS — Z823 Family history of stroke: Secondary | ICD-10-CM | POA: Diagnosis not present

## 2015-11-02 DIAGNOSIS — I2510 Atherosclerotic heart disease of native coronary artery without angina pectoris: Secondary | ICD-10-CM | POA: Diagnosis present

## 2015-11-02 DIAGNOSIS — Z833 Family history of diabetes mellitus: Secondary | ICD-10-CM | POA: Diagnosis not present

## 2015-11-02 DIAGNOSIS — K219 Gastro-esophageal reflux disease without esophagitis: Secondary | ICD-10-CM | POA: Diagnosis present

## 2015-11-02 DIAGNOSIS — I6522 Occlusion and stenosis of left carotid artery: Secondary | ICD-10-CM | POA: Diagnosis not present

## 2015-11-02 DIAGNOSIS — I639 Cerebral infarction, unspecified: Secondary | ICD-10-CM | POA: Diagnosis not present

## 2015-11-02 DIAGNOSIS — G8191 Hemiplegia, unspecified affecting right dominant side: Secondary | ICD-10-CM | POA: Diagnosis present

## 2015-11-02 DIAGNOSIS — I10 Essential (primary) hypertension: Secondary | ICD-10-CM | POA: Diagnosis not present

## 2015-11-02 LAB — LIPID PANEL
CHOL/HDL RATIO: 3.5 ratio
Cholesterol: 153 mg/dL (ref 0–200)
HDL: 44 mg/dL (ref >40)
LDL CALC: 88 mg/dL (ref 0–99)
TRIGLYCERIDES: 107 mg/dL (ref <150)
VLDL: 21 mg/dL (ref 0–40)

## 2015-11-02 LAB — CBC WITH DIFFERENTIAL/PLATELET
BASOS ABS: 0 10*3/uL (ref 0.0–0.1)
Basophils Relative: 0 %
EOS ABS: 0.1 10*3/uL (ref 0.0–0.7)
EOS PCT: 1 %
HCT: 42.5 % (ref 36.0–46.0)
Hemoglobin: 14 g/dL (ref 12.0–15.0)
Lymphocytes Relative: 40 %
Lymphs Abs: 4.4 10*3/uL — ABNORMAL HIGH (ref 0.7–4.0)
MCH: 28.1 pg (ref 26.0–34.0)
MCHC: 32.9 g/dL (ref 30.0–36.0)
MCV: 85.3 fL (ref 78.0–100.0)
MONO ABS: 0.6 10*3/uL (ref 0.1–1.0)
Monocytes Relative: 6 %
Neutro Abs: 5.7 10*3/uL (ref 1.7–7.7)
Neutrophils Relative %: 53 %
PLATELETS: 264 10*3/uL (ref 150–400)
RBC: 4.98 MIL/uL (ref 3.87–5.11)
RDW: 13.1 % (ref 11.5–15.5)
WBC: 10.8 10*3/uL — AB (ref 4.0–10.5)

## 2015-11-02 LAB — COMPREHENSIVE METABOLIC PANEL
ALK PHOS: 70 U/L (ref 38–126)
ALT: 20 U/L (ref 14–54)
AST: 27 U/L (ref 15–41)
Albumin: 4.2 g/dL (ref 3.5–5.0)
Anion gap: 10 (ref 5–15)
BILIRUBIN TOTAL: 0.4 mg/dL (ref 0.3–1.2)
BUN: 11 mg/dL (ref 6–20)
CALCIUM: 9.7 mg/dL (ref 8.9–10.3)
CO2: 26 mmol/L (ref 22–32)
CREATININE: 0.54 mg/dL (ref 0.44–1.00)
Chloride: 98 mmol/L — ABNORMAL LOW (ref 101–111)
GFR calc Af Amer: >60 mL/min (ref >60)
Glucose, Bld: 234 mg/dL — ABNORMAL HIGH (ref 65–99)
Potassium: 4 mmol/L (ref 3.5–5.1)
Sodium: 134 mmol/L — ABNORMAL LOW (ref 135–145)
TOTAL PROTEIN: 7.2 g/dL (ref 6.5–8.1)

## 2015-11-02 LAB — GLUCOSE, CAPILLARY
GLUCOSE-CAPILLARY: 164 mg/dL — AB (ref 65–99)
GLUCOSE-CAPILLARY: 153 mg/dL — AB (ref 65–99)
Glucose-Capillary: 217 mg/dL — ABNORMAL HIGH (ref 65–99)
Glucose-Capillary: 150 mg/dL — ABNORMAL HIGH (ref 65–99)
Glucose-Capillary: 149 mg/dL — ABNORMAL HIGH (ref 65–99)

## 2015-11-02 LAB — RAPID URINE DRUG SCREEN, HOSP PERFORMED
Amphetamines: NOT DETECTED
BARBITURATES: NOT DETECTED
BENZODIAZEPINES: NOT DETECTED
COCAINE: NOT DETECTED
OPIATES: NOT DETECTED
Tetrahydrocannabinol: NOT DETECTED

## 2015-11-02 LAB — URINE MICROSCOPIC-ADD ON

## 2015-11-02 LAB — I-STAT CHEM 8, ED
BUN: 10 mg/dL (ref 6–20)
CREATININE: 0.6 mg/dL (ref 0.44–1.00)
Calcium, Ion: 1.19 mmol/L (ref 1.15–1.40)
Chloride: 97 mmol/L — ABNORMAL LOW (ref 101–111)
Glucose, Bld: 228 mg/dL — ABNORMAL HIGH (ref 65–99)
HEMATOCRIT: 45 % (ref 36.0–46.0)
Hemoglobin: 15.3 g/dL — ABNORMAL HIGH (ref 12.0–15.0)
POTASSIUM: 3.9 mmol/L (ref 3.5–5.1)
Sodium: 136 mmol/L (ref 135–145)
TCO2: 25 mmol/L (ref 0–100)

## 2015-11-02 LAB — I-STAT TROPONIN, ED: TROPONIN I, POC: 0 ng/mL (ref 0.00–0.08)

## 2015-11-02 LAB — URINALYSIS, ROUTINE W REFLEX MICROSCOPIC
Bilirubin Urine: NEGATIVE
Glucose, UA: >1000 mg/dL — AB
HGB URINE DIPSTICK: NEGATIVE
KETONES UR: NEGATIVE mg/dL
LEUKOCYTES UA: NEGATIVE
Nitrite: NEGATIVE
PROTEIN: NEGATIVE mg/dL
Specific Gravity, Urine: <1.005 — ABNORMAL LOW (ref 1.005–1.030)
pH: 5 (ref 5.0–8.0)

## 2015-11-02 LAB — TROPONIN I: Troponin I: <0.03 ng/mL (ref <0.03)

## 2015-11-02 LAB — PROTIME-INR
INR: 1.2
Prothrombin Time: 15.3 seconds — ABNORMAL HIGH (ref 11.4–15.2)

## 2015-11-02 LAB — ETHANOL

## 2015-11-02 MED ORDER — STROKE: EARLY STAGES OF RECOVERY BOOK
Freq: Once | Status: AC
  Administered 2015-11-02: 10:00:00
  Filled 2015-11-02: qty 1

## 2015-11-02 MED ORDER — ALPRAZOLAM 0.25 MG PO TABS
0.2500 mg | ORAL_TABLET | Freq: Once | ORAL | Status: AC
  Administered 2015-11-02: 0.25 mg via ORAL
  Filled 2015-11-02: qty 1

## 2015-11-02 MED ORDER — PRAVASTATIN SODIUM 10 MG PO TABS
20.0000 mg | ORAL_TABLET | Freq: Every day | ORAL | Status: DC
  Administered 2015-11-02 – 2015-11-03 (×2): 20 mg via ORAL
  Filled 2015-11-02 (×2): qty 2

## 2015-11-02 MED ORDER — ASPIRIN 325 MG PO TABS
325.0000 mg | ORAL_TABLET | Freq: Every day | ORAL | Status: DC
  Administered 2015-11-02 – 2015-11-04 (×3): 325 mg via ORAL
  Filled 2015-11-02 (×3): qty 1

## 2015-11-02 MED ORDER — ASPIRIN 81 MG PO CHEW
324.0000 mg | CHEWABLE_TABLET | Freq: Once | ORAL | Status: AC
  Administered 2015-11-02: 324 mg via ORAL
  Filled 2015-11-02: qty 4

## 2015-11-02 MED ORDER — INSULIN ASPART 100 UNIT/ML ~~LOC~~ SOLN
0.0000 [IU] | Freq: Three times a day (TID) | SUBCUTANEOUS | Status: DC
  Administered 2015-11-02 – 2015-11-03 (×2): 2 [IU] via SUBCUTANEOUS
  Administered 2015-11-03: 3 [IU] via SUBCUTANEOUS
  Administered 2015-11-04 (×2): 2 [IU] via SUBCUTANEOUS

## 2015-11-02 MED ORDER — INSULIN ASPART 100 UNIT/ML ~~LOC~~ SOLN
0.0000 [IU] | Freq: Every day | SUBCUTANEOUS | Status: DC
  Administered 2015-11-03: 2 [IU] via SUBCUTANEOUS

## 2015-11-02 MED ORDER — ASPIRIN 300 MG RE SUPP: 300.0000 mg | Freq: Every day | RECTAL | Status: DC

## 2015-11-02 NOTE — Care Management Note (Signed)
Case Management Note  Patient Details  Name: Vanessa Silva MRN: [id number] Date of Birth: 02-Aug-1960  Subjective/Objective:  Patient is from home, pending stroke work up. She is independent with ADL's, she works full time. Currently has some right sided arm weakness, but PT has not recommended any HHPT.                Action/Plan: Anticipate DC home with self care. No CM needs at this time.    Expected Discharge Date:                  Expected Discharge Plan:  Home/Self Care  In-House Referral:  NA  Discharge planning Services  CM Consult  Post Acute Care Choice:  NA Choice offered to:  NA  DME Arranged:    DME Agency:     HH Arranged:    HH Agency:     Status of Service:  Completed, signed off  If discussed at [employer] of Stay Meetings, dates discussed:    Additional Comments:  Daeveon Zweber, Chrystine Oiler, RN 11/02/2015, 12:22 PM

## 2015-11-02 NOTE — Progress Notes (Signed)
SLP Cancellation Note  Patient Details Name: Vanessa Silva MRN: [id number] DOB: 12/30/1960   Cancelled treatment:       Reason Eval/Treat Not Completed: SLP screened, no needs identified, will sign off; Pt passed RN swallow screen upon admission and was placed on clear liquids. SLP spoke with pt following MRI and observed pt drinking liquids without incident. Pt reports no changes in speech, language, cognition, or swallowing at this time. She does endorse feeling very tired and weakness in right arm. No further SLP services indicated at this time. Recommend PT/OT consult. Reconsult as needed.  Thank you,  Havery Moros, CCC-SLP [phone]    [name] 11/02/2015, 9:04 AM

## 2015-11-02 NOTE — ED Notes (Signed)
Tele Neuro at this time.
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
## 2015-11-02 NOTE — Progress Notes (Signed)
Code stroke  Beeper  1155 In room 1203 Out  1207 Soc  1207 Rad called 1210

## 2015-11-02 NOTE — ED Notes (Signed)
[facility] paged @ [phone]

## 2015-11-02 NOTE — Progress Notes (Signed)
Pt was seen this am.  Pt still has slight right arm numbness, pt passed her speech evaluation, => diabetic cardiac diet.   ultrasound and MRI results pending at this time.

## 2015-11-02 NOTE — ED Notes (Signed)
[facility] notified @ [phone]

## 2015-11-02 NOTE — ED Notes (Signed)
Teleneuro Complete at this time

## 2015-11-02 NOTE — ED Notes (Signed)
Patient ambulatory to restroom with stand by assist.
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
## 2015-11-03 ENCOUNTER — Inpatient Hospital Stay (HOSPITAL_COMMUNITY): Payer: Federal, State, Local not specified - PPO

## 2015-11-03 DIAGNOSIS — R9431 Abnormal electrocardiogram [ECG] [EKG]: Secondary | ICD-10-CM

## 2015-11-03 DIAGNOSIS — E1165 Type 2 diabetes mellitus with hyperglycemia: Secondary | ICD-10-CM

## 2015-11-03 DIAGNOSIS — I639 Cerebral infarction, unspecified: Principal | ICD-10-CM

## 2015-11-03 DIAGNOSIS — I10 Essential (primary) hypertension: Secondary | ICD-10-CM

## 2015-11-03 DIAGNOSIS — E118 Type 2 diabetes mellitus with unspecified complications: Secondary | ICD-10-CM

## 2015-11-03 DIAGNOSIS — Z794 Long term (current) use of insulin: Secondary | ICD-10-CM

## 2015-11-03 LAB — ECHOCARDIOGRAM COMPLETE
CHL CUP DOP CALC LVOT VTI: 19.5 cm
CHL CUP MV DEC (S): 264
E/e' ratio: 10.11
EWDT: 264 ms
FS: 38 % (ref 28–44)
Height: 68 in
IVS/LV PW RATIO, ED: 1.06
LA ID, A-P, ES: 42 mm
LA diam end sys: 42 mm
LA vol index: 21.8 mL/m2
LA vol: 52.8 mL
LADIAMINDEX: 1.74 cm/m2
LAVOLA4C: 53.5 mL
LDCA: 3.46 cm2
LV E/e' medial: 10.11
LV E/e'average: 10.11
LV TDI E'LATERAL: 7.29
LV TDI E'MEDIAL: 4.46
LV sys vol: 24 mL (ref 14–42)
LVDIAVOL: 67 mL (ref 46–106)
LVDIAVOLIN: 28 mL/m2
LVELAT: 7.29 cm/s
LVOT peak grad rest: 3 mmHg
LVOT peak vel: 84.1 cm/s
LVOTD: 21 mm
LVOTSV: 67 mL
LVSYSVOLIN: 10 mL/m2
MV Peak grad: 2 mmHg
MVPKAVEL: 80.6 m/s
MVPKEVEL: 73.7 m/s
PW: 12 mm — AB (ref 0.6–1.1)
RV LATERAL S' VELOCITY: 14.1 cm/s
RV TAPSE: 25.4 mm
Simpson's disk: 65
Stroke v: 44 ml
Weight: 4121.6 oz

## 2015-11-03 LAB — CBC
HEMATOCRIT: 44.6 % (ref 36.0–46.0)
Hemoglobin: 14.6 g/dL (ref 12.0–15.0)
MCH: 28.1 pg (ref 26.0–34.0)
MCHC: 32.7 g/dL (ref 30.0–36.0)
MCV: 85.9 fL (ref 78.0–100.0)
PLATELETS: 243 10*3/uL (ref 150–400)
RBC: 5.19 MIL/uL — AB (ref 3.87–5.11)
RDW: 13.2 % (ref 11.5–15.5)
WBC: 7.9 10*3/uL (ref 4.0–10.5)

## 2015-11-03 LAB — ANTITHROMBIN III: AntiThromb III Func: 81 % (ref 75–120)

## 2015-11-03 LAB — GLUCOSE, CAPILLARY
GLUCOSE-CAPILLARY: 213 mg/dL — AB (ref 65–99)
GLUCOSE-CAPILLARY: 196 mg/dL — AB (ref 65–99)
GLUCOSE-CAPILLARY: 110 mg/dL — AB (ref 65–99)
GLUCOSE-CAPILLARY: 224 mg/dL — AB (ref 65–99)

## 2015-11-03 LAB — COMPREHENSIVE METABOLIC PANEL
ALBUMIN: 4 g/dL (ref 3.5–5.0)
ALT: 19 U/L (ref 14–54)
AST: 20 U/L (ref 15–41)
Alkaline Phosphatase: 65 U/L (ref 38–126)
Anion gap: 9 (ref 5–15)
BILIRUBIN TOTAL: 0.5 mg/dL (ref 0.3–1.2)
BUN: 12 mg/dL (ref 6–20)
CHLORIDE: 104 mmol/L (ref 101–111)
CO2: 25 mmol/L (ref 22–32)
CREATININE: 0.45 mg/dL (ref 0.44–1.00)
Calcium: 9.2 mg/dL (ref 8.9–10.3)
GFR calc Af Amer: >60 mL/min (ref >60)
GLUCOSE: 164 mg/dL — AB (ref 65–99)
Potassium: 3.9 mmol/L (ref 3.5–5.1)
Sodium: 138 mmol/L (ref 135–145)
Total Protein: 7.1 g/dL (ref 6.5–8.1)

## 2015-11-03 LAB — HEMOGLOBIN A1C

## 2015-11-03 LAB — SEDIMENTATION RATE: Sed Rate: 18 mm/hr (ref 0–22)

## 2015-11-03 LAB — TSH: TSH: 1.628 u[IU]/mL (ref 0.350–4.500)

## 2015-11-03 MED ORDER — POLYETHYLENE GLYCOL 3350 17 G PO PACK
17.0000 g | PACK | Freq: Every day | ORAL | Status: DC
  Administered 2015-11-03 – 2015-11-04 (×2): 17 g via ORAL
  Filled 2015-11-03 (×2): qty 1

## 2015-11-03 MED ORDER — ALPRAZOLAM 0.25 MG PO TABS
0.2500 mg | ORAL_TABLET | Freq: Every day | ORAL | Status: DC | PRN
  Administered 2015-11-03: 0.25 mg via ORAL
  Filled 2015-11-03: qty 1

## 2015-11-03 MED ORDER — CARVEDILOL 12.5 MG PO TABS
25.0000 mg | ORAL_TABLET | Freq: Two times a day (BID) | ORAL | Status: DC
  Administered 2015-11-03 – 2015-11-04 (×2): 25 mg via ORAL
  Filled 2015-11-03 (×2): qty 2

## 2015-11-03 MED ORDER — ACETAMINOPHEN 325 MG PO TABS
650.0000 mg | ORAL_TABLET | Freq: Four times a day (QID) | ORAL | Status: DC | PRN
  Administered 2015-11-03 – 2015-11-04 (×3): 650 mg via ORAL
  Filled 2015-11-03 (×3): qty 2

## 2015-11-03 MED ORDER — PAROXETINE HCL 20 MG PO TABS
40.0000 mg | ORAL_TABLET | Freq: Every day | ORAL | Status: DC
  Administered 2015-11-03 – 2015-11-04 (×2): 40 mg via ORAL
  Filled 2015-11-03 (×2): qty 2

## 2015-11-03 MED ORDER — PRAVASTATIN SODIUM 10 MG PO TABS: 20.0000 mg | ORAL_TABLET | Freq: Every day | ORAL | Status: DC

## 2015-11-03 MED ORDER — DULOXETINE HCL 30 MG PO CPEP
30.0000 mg | ORAL_CAPSULE | Freq: Every day | ORAL | Status: DC
  Administered 2015-11-03 – 2015-11-04 (×2): 30 mg via ORAL
  Filled 2015-11-03 (×2): qty 1

## 2015-11-03 NOTE — Progress Notes (Signed)
PROGRESS NOTE    Vanessa HaffDawn Y Coard  ZOX:[id number] DOB: 02/18/1961 DOA: 11/01/2015 PCP: Pershing ProudJACKSON,SAMANTHA, PA-C    Brief Narrative:  Vanessa Silva is a 55 y.o. female with medical history significant of DM, fibromyalgia, CAD, depression, HTN, HLD comes in with sudden onset of right arm feeling heavy and numb earlier while dumping her trash.  She came to ED , code stroke was called tele neuro evaluated the patient and decided no tpa as her symptoms were improving in the ED.  She said this happened at 1130pm and she could hardly move her arm.  No other neurological symptoms.  Pt can now move her arm, it feels some numb still but she has full strength.  No prior h/o CAD or CVA.  Pt referred for admission for possible CVA.   Assessment & Plan:   Principal Problem:   Right arm numbness Active Problems:   DM (diabetes mellitus), type 2, uncontrolled (HCC)   HTN (hypertension)   Fibromyalgia   Chronic combined systolic and diastolic CHF (congestive heart failure) (HCC)   CAD (coronary artery disease)   Acute CVA (cerebrovascular accident) (HCC)  Acute CVA (cerebrovascular accident) - neurology consulted - MRI shows Small acute cortical infarctions in the left parietal lobe, possible emboli. No other area of acute or chronic ischemia - Echocardiogram ordered and pending - await neurology recommendations - Full strength aspirin - all weakness resolve  Right arm numbness - almost completely resolved - Takes baby aspirin at home - Place on full aspirin until work up complete - Obtain mri brain, carotid dopplers and cardiac echo - Check flp in the am - Freq neuro checks - cva pathway.  Diabetes Mellitus, Type 2, Uncontrolled - Continue ISS - continue CBG monitoring - can restart Tresiba at 50 units qhs  HTN - blood pressure slightly elevated - will restart carvedilol, quinapril  Fibromyalgia - continue Tramadol and Cymbalta  Depression - continue Cymbalta and  Duloxetine    DVT prophylaxis: SCDs Code Status: Full Family Communication: friends bedside Disposition Plan: discharge home after evaluation from neurology   Consultants:   Neurology  Procedures:   Echocardiogram 9/7  Antimicrobials:   none    Subjective: Patient is sitting in bed and states she is feeling well.  Denies any residual weakness or numbness.  Working on straightening and bending fingers of right hand.  Denies any changes in visual or any headache.  Did have a headache earlier but took Tylenol and voices that she feels better after taking that.  Denies chest pain, chest pressure, shortness of breath, abdominal pain, nausea, vomiting or diarrhea. Asked numerous questions about cause of strokes and prevention of future strokes at time of discharge.  Objective: Vitals:   11/02/15 2017 11/02/15 2300 11/03/15 0300 11/03/15 0600  BP:  (!) 141/78 (!) 150/74 (!) 152/81  Pulse:  72 82 81  Resp:  18 18 18   Temp:  98.3 F (36.8 C) 98.4 F (36.9 C) 97 F (36.1 C)  TempSrc:  Oral Oral Oral  SpO2: 96% 95% 96% 95%  Weight:      Height:       No intake or output data in the 24 hours ending 11/03/15 1052 Filed Weights   11/01/15 2353 11/02/15 0300  Weight: 116.1 kg (256 lb) 116.8 kg (257 lb 9.6 oz)    Examination:  General exam: Appears calm and comfortable  Respiratory system: Clear to auscultation. Respiratory effort normal. Cardiovascular system: S1 & S2 heard, RRR. No JVD, murmurs, rubs,  gallops or clicks. No pedal edema. Gastrointestinal system: Abdomen is obese, nondistended, soft and nontender. No organomegaly or masses felt. Normal bowel sounds heard. Central nervous system: Alert and oriented. No focal neurological deficits. FROM upper and lower extremities bilaterally with no appreciable weakness Extremities: Symmetric 5 x 5 power. Skin: No rashes, lesions or ulcers Psychiatry: Judgement and insight appear normal. Mood & affect appropriate.     Data  Reviewed: I have personally reviewed following labs and imaging studies  CBC:  Recent Labs Lab 11/01/15 2358 11/02/15 0007 11/03/15 0507  WBC 10.8*  --  7.9  NEUTROABS 5.7  --   --   HGB 14.0 15.3* 14.6  HCT 42.5 45.0 44.6  MCV 85.3  --  85.9  PLT 264  --  243   Basic Metabolic Panel:  Recent Labs Lab 11/01/15 2358 11/02/15 0007 11/03/15 0507  NA 134* 136 138  K 4.0 3.9 3.9  CL 98* 97* 104  CO2 26  --  25  GLUCOSE 234* 228* 164*  BUN 11 10 12   CREATININE 0.54 0.60 0.45  CALCIUM 9.7  --  9.2   GFR: Estimated Creatinine Clearance: 109.3 mL/min (by C-G formula based on SCr of 0.8 mg/dL). Liver Function Tests:  Recent Labs Lab 11/01/15 2358 11/03/15 0507  AST 27 20  ALT 20 19  ALKPHOS 70 65  BILITOT 0.4 0.5  PROT 7.2 7.1  ALBUMIN 4.2 4.0   No results for input(s): LIPASE, AMYLASE in the last 168 hours. No results for input(s): AMMONIA in the last 168 hours. Coagulation Profile:  Recent Labs Lab 11/01/15 2358  INR 1.20   Cardiac Enzymes:  Recent Labs Lab 11/01/15 2358  TROPONINI <0.03   BNP (last 3 results) No results for input(s): PROBNP in the last 8760 hours. HbA1C: No results for input(s): HGBA1C in the last 72 hours. CBG:  Recent Labs Lab 11/02/15 1006 11/02/15 1128 11/02/15 1556 11/02/15 2115 11/03/15 0803  GLUCAP 150* 149* 164* 153* 213*   Lipid Profile:  Recent Labs  11/02/15 0551  CHOL 153  HDL 44  LDLCALC 88  TRIG 107  CHOLHDL 3.5   Thyroid Function Tests:  Recent Labs  11/03/15 0507  TSH 1.628   Anemia Panel: No results for input(s): VITAMINB12, FOLATE, FERRITIN, TIBC, IRON, RETICCTPCT in the last 72 hours. Sepsis Labs: No results for input(s): PROCALCITON, LATICACIDVEN in the last 168 hours.  No results found for this or any previous visit (from the past 240 hour(s)).       Radiology Studies: Mr Brain Wo Contrast  Result Date: 11/02/2015 CLINICAL DATA:  Right arm numbness flat and weakness. Speech  difficulty EXAM: MRI HEAD WITHOUT CONTRAST TECHNIQUE: Multiplanar, multiecho pulse sequences of the brain and surrounding structures were obtained without intravenous contrast. COMPARISON:  CT head 11/02/2015 lightheaded a in the FINDINGS: Brain: Several scattered small areas of cortical infarction in the left parietal lobe. No associated hemorrhage. No other areas of acute infarct. No significant chronic ischemic changes. Otherwise normal white matter. Negative for mass or edema. No shift of the midline structures. Vascular: Normal flow voids. Skull and upper cervical spine: Negative Sinuses/Orbits: Negative Other: Pituitary normal in size IMPRESSION: Small acute cortical infarctions in the left parietal lobe, possible emboli. No other area of acute or chronic ischemia. Electronically Signed   By: Marlan Palau M.D.   On: 11/02/2015 09:10   US Carotid Bilateral (at [facility] Only)  Result Date: 11/02/2015 CLINICAL DATA:  Acute onset right arm  numbness. EXAM: BILATERAL CAROTID DUPLEX ULTRASOUND TECHNIQUE: Wallace Cullens scale imaging, color Doppler and duplex ultrasound were performed of bilateral carotid and vertebral arteries in the neck. COMPARISON:  11/02/2015. FINDINGS: Criteria: Quantification of carotid stenosis is based on velocity parameters that correlate the residual internal carotid diameter with NASCET-based stenosis levels, using the diameter of the distal internal carotid lumen as the denominator for stenosis measurement. The following velocity measurements were obtained: RIGHT ICA:  110/33 cm/sec CCA:  96/24 cm/sec SYSTOLIC ICA/CCA RATIO:  1.2 DIASTOLIC ICA/CCA RATIO:  1.4 ECA:  126 cm/sec LEFT ICA:  193/52 cm/sec CCA:  100/21 cm/sec SYSTOLIC ICA/CCA RATIO:  1.9 DIASTOLIC ICA/CCA RATIO:  2.5 ECA:  119 cm/sec RIGHT CAROTID ARTERY: No significant right carotid atherosclerotic vascular disease identified. RIGHT VERTEBRAL ARTERY:  Patent with antegrade flow. LEFT CAROTID ARTERY: Moderate right carotid  bifurcation atherosclerotic vascular disease. Degree of stenosis less than 50%. LEFT VERTEBRAL ARTERY:  Patent antegrade flow. IMPRESSION: 1. Moderate left carotid bifurcation atherosclerotic vascular disease. Degree of stenosis less than 50%. No significant right carotid atherosclerotic vascular disease. 2.  Vertebrals are patent antegrade flow. Electronically Signed   By: Maisie Fus  Register   On: 11/02/2015 11:32   Ct Head Code Stroke W/o Cm  Result Date: 11/02/2015 CLINICAL DATA:  Code stroke.  Right upper extremity weakness EXAM: CT HEAD WITHOUT CONTRAST TECHNIQUE: Contiguous axial images were obtained from the base of the skull through the vertex without intravenous contrast. COMPARISON:  None. FINDINGS: Brain: No mass lesion, intraparenchymal hemorrhage or extra-axial collection. Cortical gray-white differentiation is preserved. The basal ganglia and insular cortices are normal. Brain parenchyma and CSF-containing spaces are normal for age. Vascular: No hyperdense vessel or atherosclerotic calcification. Skull: Normal visualized skull base, calvarium and extracranial soft tissues. Sinuses/Orbits: No sinus fluid levels or advanced mucosal thickening. No mastoid effusion. Normal orbits. ASPECTS [facility] Stroke Program Early CT Score) - Ganglionic level infarction (caudate, lentiform nuclei, internal capsule, insula, M1-M3 cortex): 7 - Supraganglionic infarction (M4-M6 cortex): 3 Total score (0-10 with 10 being normal): 10 IMPRESSION: 1. No acute intracranial hemorrhage. 2. ASPECTS is 10.  No CT evidence of acute infarct. These results were called by telephone at the time of interpretation on 11/02/2015 at 12:18 am to Dr. Devoria Albe , who verbally acknowledged these results. Electronically Signed   By: Deatra Robinson M.D.   On: 11/02/2015 00:18        Scheduled Meds: . aspirin  300 mg Rectal Daily   Or  . aspirin  325 mg Oral Daily  . insulin aspart  0-5 Units Subcutaneous QHS  . insulin aspart  0-9  Units Subcutaneous TID WC  . polyethylene glycol  17 g Oral Daily  . pravastatin  20 mg Oral q1800   Continuous Infusions:    LOS: 1 day    Time spent: 25 minutes    Bennett Scrape, MD [facility] Pager [phone]  If 7PM-7AM, please contact night-coverage [email or web address] Password [email or web address] 11/03/2015, 10:52 AM

## 2015-11-03 NOTE — Progress Notes (Signed)
*  PRELIMINARY RESULTS* Echocardiogram 2D Echocardiogram has been performed.  Stacey Drain 11/03/2015, 1:29 PM
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
## 2015-11-04 ENCOUNTER — Other Ambulatory Visit: Payer: Self-pay

## 2015-11-04 DIAGNOSIS — I639 Cerebral infarction, unspecified: Secondary | ICD-10-CM

## 2015-11-04 DIAGNOSIS — R200 Anesthesia of skin: Secondary | ICD-10-CM

## 2015-11-04 DIAGNOSIS — R202 Paresthesia of skin: Secondary | ICD-10-CM

## 2015-11-04 LAB — HOMOCYSTEINE: HOMOCYSTEINE-NORM: 7.3 umol/L (ref 0.0–15.0)

## 2015-11-04 LAB — GLUCOSE, CAPILLARY
GLUCOSE-CAPILLARY: 194 mg/dL — AB (ref 65–99)
Glucose-Capillary: 195 mg/dL — ABNORMAL HIGH (ref 65–99)

## 2015-11-04 LAB — RPR: RPR Ser Ql: NONREACTIVE

## 2015-11-04 LAB — PROTEIN C, TOTAL: Protein C, Total: 129 % (ref 60–150)

## 2015-11-04 MED ORDER — CLOPIDOGREL BISULFATE 75 MG PO TABS: 75.0000 mg | ORAL_TABLET | Freq: Every day | ORAL | 0 refills | Status: DC

## 2015-11-04 MED ORDER — INSULIN ASPART 100 UNIT/ML ~~LOC~~ SOLN: 0.0000 [IU] | Freq: Three times a day (TID) | SUBCUTANEOUS | 11 refills | Status: AC

## 2015-11-04 MED ORDER — CLOPIDOGREL BISULFATE 75 MG PO TABS
75.0000 mg | ORAL_TABLET | Freq: Every day | ORAL | Status: DC
  Administered 2015-11-04: 75 mg via ORAL
  Filled 2015-11-04: qty 1

## 2015-11-04 MED ORDER — ATORVASTATIN CALCIUM 40 MG PO TABS: 40.0000 mg | ORAL_TABLET | Freq: Every day | ORAL | Status: DC

## 2015-11-04 MED ORDER — ATORVASTATIN CALCIUM 80 MG PO TABS: 80.0000 mg | ORAL_TABLET | Freq: Every day | ORAL | 1 refills | Status: DC

## 2015-11-04 MED ORDER — INSULIN ASPART 100 UNIT/ML ~~LOC~~ SOLN: 0.0000 [IU] | Freq: Every day | SUBCUTANEOUS | 11 refills | Status: DC

## 2015-11-04 MED ORDER — ASPIRIN 325 MG PO TABS: 325.0000 mg | ORAL_TABLET | Freq: Every day | ORAL | Status: DC

## 2015-11-04 NOTE — Progress Notes (Signed)
Reviewed d/c instructions and medications with patient and her sister.  Cardiology is to send Holter monitor to pt's home Monday.  VSS and pt has no questions at this time.

## 2015-11-04 NOTE — Progress Notes (Signed)
Inpatient Diabetes Program Recommendations  AACE/ADA: New Consensus Statement on Inpatient Glycemic Control (2015)  Target Ranges:  Prepandial:   less than 140 mg/dL      Peak postprandial:   less than 180 mg/dL (1-2 hours)      Critically ill patients:  140 - 180 mg/dL   Results for Vanessa Silva, Vanessa Silva (MRN [id number]) as of 11/04/2015 10:35  Ref. Range 11/03/2015 08:03 11/03/2015 12:13 11/03/2015 16:32 11/03/2015 21:11 11/04/2015 08:24  Glucose-Capillary Latest Ref Range: 65 - 99 mg/dL 287 (H) 867 (H) 672 (H) 224 (H) 195 (H)    Review of Glycemic Control  Diabetes history: DM2 Outpatient Diabetes medications: Treshiba 68 units QHS, Invokana 300 mg daily, Glyburide 5 mg BID, Janumet XR [phone] mg daily, Victoza 1.8 mg daily. Current orders for Inpatient glycemic control: Novolog 0-9 units TID with meals,  Novolog 0-5 units QHS  Inpatient Diabetes Program Recommendations:  Insulin - Basal: Please consider adding Lantus 34 units daily (half of home basal dose). HgbA1C: Please order A1C (11.2 was last in 2015).  Thanks!  Kristine Linea, RN, BSN Diabetes Coordinator Inpatient Diabetes Program [phone] (Team Pager) [phone] ([facility] office) [phone] [facility] office) [phone] [facility] office)

## 2015-11-04 NOTE — Discharge Summary (Signed)
Physician Discharge Summary  Vanessa Silva ZOX:[id number] DOB: 12-Dec-1960 DOA: 11/01/2015  PCP: Pershing Proud  Admit date: 11/01/2015 Discharge date: 11/04/2015  Admitted From: Home Disposition:  Home  Recommendations for Outpatient Follow-up:  1. Follow up with PCP in 1-2 weeks 2. Follow up with Dr. Gerilyn Pilgrim in 6 weeks 3. Event monitor ordered for outpatient  Home Health:No Equipment/Devices: None  Discharge Condition:Stable CODE STATUS: Full Diet recommendation: Heart Healthy Carb Modified  Brief/Interim Summary: Vanessa Silva a 55 y.o.femalewith medical history significant of DM, fibromyalgia, CAD, depression, HTN, HLD comes in with sudden onset of right arm feeling heavy and numb earlier while dumping her trash. She came to ED , code stroke was called tele neuro evaluated the patient and decided no tpa as her symptoms were improving in the ED. She said this happened at 1130pm and she could hardly move her arm. No other neurological symptoms. Pt can now move her arm, it feels some numb still but she has full strength. No prior h/o CAD or CVA. Pt found to have numerous small areas of infarcts on MRI.  Discharge Diagnoses:  Principal Problem:   Right arm numbness Active Problems:   DM (diabetes mellitus), type 2, uncontrolled (HCC)   HTN (hypertension)   Fibromyalgia   Chronic combined systolic and diastolic CHF (congestive heart failure) (HCC)   CAD (coronary artery disease)   Ischemic stroke (HCC)   Numbness and tingling in right hand  Acute CVA (cerebrovascular accident) - neurology consulted - MRI shows Small acute cortical infarctions in the left parietal lobe, possible emboli. No other area of acute or chronic ischemia - Echocardiogram performed - Full strength aspirin - all weakness resolve  Right arm numbness - completely resolved - Start Plavix -Place on full aspirin  - Will need dual antiplatelet therapy for 3 months  Diabetes  Mellitus, Type 2, Uncontrolled - Continue ISS - continue CBG monitoring - can restart Tresiba at 50 units qhs  HTN - blood pressure slightly elevated - will restart carvedilol, quinapril  Fibromyalgia - continue Tramadol and Cymbalta  Depression - continue Cymbalta and Duloxetine  Discharge Instructions  Discharge Instructions    Call MD for:  difficulty breathing, headache or visual disturbances    Complete by:  As directed   Call MD for:  persistant dizziness or light-headedness    Complete by:  As directed   Diet - low sodium heart healthy    Complete by:  As directed   Increase activity slowly    Complete by:  As directed   Other Restrictions    Complete by:  As directed   Return to work after clearance from PCP       Medication List    STOP taking these medications   aspirin EC 81 MG tablet Replaced by:  aspirin 325 MG tablet   pravastatin 20 MG tablet Commonly known as:  PRAVACHOL     TAKE these medications   ALPRAZolam 0.5 MG tablet Commonly known as:  XANAX Take 0.25 mg by mouth at bedtime as needed for anxiety or sleep.   aspirin 325 MG tablet Take 1 tablet (325 mg total) by mouth daily. Replaces:  aspirin EC 81 MG tablet   atorvastatin 80 MG tablet Commonly known as:  LIPITOR Take 1 tablet (80 mg total) by mouth daily.   carvedilol 25 MG tablet Commonly known as:  COREG Take 1 tablet (25 mg total) by mouth 2 (two) times daily with a meal.   clopidogrel 75  MG tablet Commonly known as:  PLAVIX Take 1 tablet (75 mg total) by mouth daily.   clopidogrel 75 MG tablet Commonly known as:  PLAVIX Take 1 tablet (75 mg total) by mouth daily.   docusate sodium 100 MG capsule Commonly known as:  COLACE Take 100 mg by mouth daily.   DULoxetine 30 MG capsule Commonly known as:  CYMBALTA TAKE ONE CAPSULE ONCE DAILY AT BEDTIME   esomeprazole 20 MG capsule Commonly known as:  NEXIUM Take 20 mg by mouth 2 (two) times daily before a meal.   ferrous  sulfate 325 (65 FE) MG EC tablet Take 325 mg by mouth daily with breakfast.   furosemide 20 MG tablet Commonly known as:  LASIX TAKE 1 TABLET BY MOUTH DAILY   gabapentin 100 MG capsule Commonly known as:  NEURONTIN Take 200 mg by mouth 2 (two) times daily.   glyBURIDE 5 MG tablet Commonly known as:  DIABETA Take 5 mg by mouth 2 (two) times daily with a meal.   insulin aspart 100 UNIT/ML injection Commonly known as:  novoLOG Inject 0-9 Units into the skin 3 (three) times daily with meals.   insulin aspart 100 UNIT/ML injection Commonly known as:  novoLOG Inject 0-5 Units into the skin at bedtime.   INVOKANA 300 MG Tabs tablet Generic drug:  canagliflozin Take 300 mg by mouth daily.   JANUMET XR [phone] MG Tb24 Generic drug:  SitaGLIPtin-MetFORMIN HCl Take 1 tablet by mouth daily.   loratadine 10 MG tablet Commonly known as:  CLARITIN Take 10 mg by mouth daily.   montelukast 10 MG tablet Commonly known as:  SINGULAIR Take 10 mg by mouth daily.   nitroGLYCERIN 0.4 MG SL tablet Commonly known as:  NITROSTAT Place 1 tablet (0.4 mg total) under the tongue every 5 (five) minutes as needed for chest pain.   PARoxetine 40 MG tablet Commonly known as:  PAXIL Take 40 mg by mouth daily.   POTASSIMIN PO Take 1 tablet by mouth daily.   quinapril 20 MG tablet Commonly known as:  ACCUPRIL Take 20 mg by mouth daily.   traMADol 50 MG tablet Commonly known as:  ULTRAM Take 1 tablet (50 mg total) by mouth every 6 (six) hours as needed for moderate pain. What changed:  when to take this   TRESIBA FLEXTOUCH 200 UNIT/ML Sopn Generic drug:  Insulin Degludec INJECT 68 UNITS SUBCUTANEOUSLY EVERY DAY AT BEDTIME   VICTOZA 18 MG/3ML Sopn Generic drug:  Liraglutide Inject into the skin daily.   VITAMIN B-12 PO Take 1 tablet by mouth daily.   VITAMIN D3 PO Take 1 capsule by mouth daily.       Allergies  Allergen Reactions  . Demerol [Meperidine] Nausea And Vomiting  .  Morphine And Related Nausea And Vomiting  . Promethazine Other (See Comments)    No reaction listed    Consultations:  Neurology   Procedures/Studies: Mr Shirlee Latch Wo Contrast  Result Date: 11/04/2015 CLINICAL DATA:  Patient complains of numbness to the RIGHT arm. Weakness of the RIGHT arm. EXAM: MRA HEAD WITHOUT CONTRAST TECHNIQUE: Angiographic images of the Circle of Willis were obtained using MRA technique without intravenous contrast. COMPARISON:  MRI brain 11/02/2015. FINDINGS: There is motion degradation on the images because the patient was wiping her nose. The study is suboptimal. The internal carotid arteries are symmetric. There may be minor atheromatous change in the cavernous carotids, with artifactual signal loss and horizontal band across the distal cavernous segment. Both ICA  termini are patent. The RIGHT M1 MCA is widely patent.  Severely diseased RIGHT A1 ACA. There is a severe stenosis at the origin of the RIGHT M1 MCA, estimated 90%. Despite patient motion, this does not appear to be artifactual. See image 27 series 108. Severely diseased LEFT A1 ACA. BILATERAL irregularity of the M2 and M3 MCA branches representing intracranial atherosclerotic disease is observed. Basilar artery is mildly irregular without focal stenosis. Both vertebrals contribute to basilar formation, RIGHT dominant. Distal stenosis of the LEFT vertebral estimated 50%. Moderate irregularity of LEFT greater than RIGHT posterior cerebral arteries both proximally and distally. Flow reducing stenosis of the LEFT P1 PCA estimated 75%. Unremarkable superior cerebellar arteries. AICA branches poorly seen. Both PICA branches are patent. No saccular aneurysm. IMPRESSION: Motion degraded exam.  See discussion above. Severe stenosis of the proximal LEFT middle cerebral artery could explain the observed pattern of possible watershed cerebral infarction. See discussion above. Electronically Signed   By: Elsie StainJohn T Curnes M.D.   On:  11/04/2015 13:31   Mr Brain Wo Contrast  Result Date: 11/02/2015 CLINICAL DATA:  Right arm numbness flat and weakness. Speech difficulty EXAM: MRI HEAD WITHOUT CONTRAST TECHNIQUE: Multiplanar, multiecho pulse sequences of the brain and surrounding structures were obtained without intravenous contrast. COMPARISON:  CT head 11/02/2015 lightheaded a in the FINDINGS: Brain: Several scattered small areas of cortical infarction in the left parietal lobe. No associated hemorrhage. No other areas of acute infarct. No significant chronic ischemic changes. Otherwise normal white matter. Negative for mass or edema. No shift of the midline structures. Vascular: Normal flow voids. Skull and upper cervical spine: Negative Sinuses/Orbits: Negative Other: Pituitary normal in size IMPRESSION: Small acute cortical infarctions in the left parietal lobe, possible emboli. No other area of acute or chronic ischemia. Electronically Signed   By: Marlan Palauharles  Clark M.D.   On: 11/02/2015 09:10   [place] Carotid Bilateral (at [facility] And [facility] Only)  Result Date: 11/02/2015 CLINICAL DATA:  Acute onset right arm numbness. EXAM: BILATERAL CAROTID DUPLEX ULTRASOUND TECHNIQUE: Wallace CullensGray scale imaging, color Doppler and duplex ultrasound were performed of bilateral carotid and vertebral arteries in the neck. COMPARISON:  11/02/2015. FINDINGS: Criteria: Quantification of carotid stenosis is based on velocity parameters that correlate the residual internal carotid diameter with NASCET-based stenosis levels, using the diameter of the distal internal carotid lumen as the denominator for stenosis measurement. The following velocity measurements were obtained: RIGHT ICA:  110/33 cm/sec CCA:  96/24 cm/sec SYSTOLIC ICA/CCA RATIO:  1.2 DIASTOLIC ICA/CCA RATIO:  1.4 ECA:  126 cm/sec LEFT ICA:  193/52 cm/sec CCA:  100/21 cm/sec SYSTOLIC ICA/CCA RATIO:  1.9 DIASTOLIC ICA/CCA RATIO:  2.5 ECA:  119 cm/sec RIGHT CAROTID ARTERY: No significant right carotid atherosclerotic  vascular disease identified. RIGHT VERTEBRAL ARTERY:  Patent with antegrade flow. LEFT CAROTID ARTERY: Moderate right carotid bifurcation atherosclerotic vascular disease. Degree of stenosis less than 50%. LEFT VERTEBRAL ARTERY:  Patent antegrade flow. IMPRESSION: 1. Moderate left carotid bifurcation atherosclerotic vascular disease. Degree of stenosis less than 50%. No significant right carotid atherosclerotic vascular disease. 2.  Vertebrals are patent antegrade flow. Electronically Signed   By: Maisie Fushomas  Register   On: 11/02/2015 11:32   Ct Head Code Stroke W/o Cm  Result Date: 11/02/2015 CLINICAL DATA:  Code stroke.  Right upper extremity weakness EXAM: CT HEAD WITHOUT CONTRAST TECHNIQUE: Contiguous axial images were obtained from the base of the skull through the vertex without intravenous contrast. COMPARISON:  None. FINDINGS: Brain: No mass lesion, intraparenchymal hemorrhage or  extra-axial collection. Cortical gray-white differentiation is preserved. The basal ganglia and insular cortices are normal. Brain parenchyma and CSF-containing spaces are normal for age. Vascular: No hyperdense vessel or atherosclerotic calcification. Skull: Normal visualized skull base, calvarium and extracranial soft tissues. Sinuses/Orbits: No sinus fluid levels or advanced mucosal thickening. No mastoid effusion. Normal orbits. ASPECTS [facility] Stroke Program Early CT Score) - Ganglionic level infarction (caudate, lentiform nuclei, internal capsule, insula, M1-M3 cortex): 7 - Supraganglionic infarction (M4-M6 cortex): 3 Total score (0-10 with 10 being normal): 10 IMPRESSION: 1. No acute intracranial hemorrhage. 2. ASPECTS is 10.  No CT evidence of acute infarct. These results were called by telephone at the time of interpretation on 11/02/2015 at 12:18 am to Dr. Devoria Albe , who verbally acknowledged these results. Electronically Signed   By: Deatra Robinson M.D.   On: 11/02/2015 00:18        Subjective: Patient awake, alert  and oriented.  She is very upset about feeling like she is being kept in the dark about what is going on.  She feels as though there has been very poor communication in terms of what the plan is and what has happened in terms of her MRA.  She did later apologize and voice that she is scared and anxious.  She asked appropriate questions and was engaged in her care.  Discharge Exam: Vitals:   11/04/15 1500 11/04/15 1630  BP: (!) 154/83 138/71  Pulse: 78 74  Resp: 20 18  Temp:     Vitals:   11/04/15 0722 11/04/15 1100 11/04/15 1500 11/04/15 1630  BP:  (!) 175/79 (!) 154/83 138/71  Pulse: 73 86 78 74  Resp:  20 20 18   Temp:      TempSrc:      SpO2:  100% 98% 97%  Weight:      Height:        General: Pt is alert, awake, not in acute distress Cardiovascular: RRR, S1/S2 +, no rubs, no gallops Respiratory: CTA bilaterally, no wheezing, no rhonchi Abdominal: Soft, NT, ND, bowel sounds + Extremities: no edema, no cyanosis    The results of significant diagnostics from this hospitalization (including imaging, microbiology, ancillary and laboratory) are listed below for reference.     Microbiology: No results found for this or any previous visit (from the past 240 hour(s)).   Labs: BNP (last 3 results) No results for input(s): BNP in the last 8760 hours. Basic Metabolic Panel:  Recent Labs Lab 11/01/15 2358 11/02/15 0007 11/03/15 0507  NA 134* 136 138  K 4.0 3.9 3.9  CL 98* 97* 104  CO2 26  --  25  GLUCOSE 234* 228* 164*  BUN 11 10 12   CREATININE 0.54 0.60 0.45  CALCIUM 9.7  --  9.2   Liver Function Tests:  Recent Labs Lab 11/01/15 2358 11/03/15 0507  AST 27 20  ALT 20 19  ALKPHOS 70 65  BILITOT 0.4 0.5  PROT 7.2 7.1  ALBUMIN 4.2 4.0   No results for input(s): LIPASE, AMYLASE in the last 168 hours. No results for input(s): AMMONIA in the last 168 hours. CBC:  Recent Labs Lab 11/01/15 2358 11/02/15 0007 11/03/15 0507  WBC 10.8*  --  7.9  NEUTROABS 5.7   --   --   HGB 14.0 15.3* 14.6  HCT 42.5 45.0 44.6  MCV 85.3  --  85.9  PLT 264  --  243   Cardiac Enzymes:  Recent Labs Lab 11/01/15 2358  TROPONINI <0.03  BNP: Invalid input(s): POCBNP CBG:  Recent Labs Lab 11/03/15 1213 11/03/15 1632 11/03/15 2111 11/04/15 0824 11/04/15 1156  GLUCAP 196* 110* 224* 195* 194*   D-Dimer No results for input(s): DDIMER in the last 72 hours. Hgb A1c  Recent Labs  11/02/15 0551  HGBA1C SEE SEPARATE REPORT   Lipid Profile  Recent Labs  11/02/15 0551  CHOL 153  HDL 44  LDLCALC 88  TRIG 107  CHOLHDL 3.5   Thyroid function studies  Recent Labs  11/03/15 0507  TSH 1.628   Anemia work up No results for input(s): VITAMINB12, FOLATE, FERRITIN, TIBC, IRON, RETICCTPCT in the last 72 hours. Urinalysis    Component Value Date/Time   COLORURINE YELLOW 11/02/2015 0105   APPEARANCEUR CLEAR 11/02/2015 0105   LABSPEC <1.005 (L) 11/02/2015 0105   PHURINE 5.0 11/02/2015 0105   GLUCOSEU >1000 (A) 11/02/2015 0105   HGBUR NEGATIVE 11/02/2015 0105   BILIRUBINUR NEGATIVE 11/02/2015 0105   KETONESUR NEGATIVE 11/02/2015 0105   PROTEINUR NEGATIVE 11/02/2015 0105   UROBILINOGEN 0.2 08/20/2009 1700   NITRITE NEGATIVE 11/02/2015 0105   LEUKOCYTESUR NEGATIVE 11/02/2015 0105   Sepsis Labs Invalid input(s): PROCALCITONIN,  WBC,  LACTICIDVEN Microbiology No results found for this or any previous visit (from the past 240 hour(s)).   Time coordinating discharge: Over 30 minutes  SIGNED:   Bennett Scrape, MD  [facility] 11/04/2015, 7:06 PM Pager [phone] If 7PM-7AM, please contact night-coverage [email or web address] Password [email or web address]

## 2015-11-05 LAB — LUPUS ANTICOAGULANT PANEL
DRVVT: 37 s (ref 0.0–47.0)
PTT LA: 34.2 s (ref 0.0–51.9)

## 2015-11-05 LAB — CARDIOLIPIN ANTIBODIES, IGG, IGM, IGA
Anticardiolipin IgG: <9 GPL U/mL (ref 0–14)
Anticardiolipin IgM: <9 MPL U/mL (ref 0–12)

## 2015-11-05 LAB — BETA-2-GLYCOPROTEIN I ABS, IGG/M/A
Beta-2 Glyco I IgG: <9 GPI IgG units (ref 0–20)
Beta-2-Glycoprotein I IgM: <9 GPI IgM units (ref 0–32)

## 2015-11-05 LAB — PROTEIN S ACTIVITY: PROTEIN S ACTIVITY: 129 % (ref 63–140)

## 2015-11-05 LAB — PROTEIN S, TOTAL: PROTEIN S AG TOTAL: 136 % (ref 60–150)

## 2015-11-05 LAB — PROTEIN C ACTIVITY: PROTEIN C ACTIVITY: 148 % (ref 73–180)

## 2015-11-08 ENCOUNTER — Ambulatory Visit (INDEPENDENT_AMBULATORY_CARE_PROVIDER_SITE_OTHER): Payer: Federal, State, Local not specified - PPO

## 2015-11-08 DIAGNOSIS — I639 Cerebral infarction, unspecified: Secondary | ICD-10-CM | POA: Diagnosis not present

## 2015-11-08 LAB — FACTOR 5 LEIDEN

## 2015-11-09 LAB — PROTHROMBIN GENE MUTATION

## 2015-11-10 NOTE — Progress Notes (Signed)
This encounter was created in error - please disregard.

## 2015-11-11 DIAGNOSIS — E1151 Type 2 diabetes mellitus with diabetic peripheral angiopathy without gangrene: Secondary | ICD-10-CM | POA: Diagnosis not present

## 2015-11-11 DIAGNOSIS — I679 Cerebrovascular disease, unspecified: Secondary | ICD-10-CM | POA: Diagnosis not present

## 2015-11-11 DIAGNOSIS — Z6839 Body mass index (BMI) 39.0-39.9, adult: Secondary | ICD-10-CM | POA: Diagnosis not present

## 2015-11-11 DIAGNOSIS — E1165 Type 2 diabetes mellitus with hyperglycemia: Secondary | ICD-10-CM | POA: Diagnosis not present

## 2015-11-11 DIAGNOSIS — I10 Essential (primary) hypertension: Secondary | ICD-10-CM | POA: Diagnosis not present

## 2015-11-11 DIAGNOSIS — E119 Type 2 diabetes mellitus without complications: Secondary | ICD-10-CM | POA: Diagnosis not present

## 2015-11-16 ENCOUNTER — Encounter (HOSPITAL_COMMUNITY): Payer: Self-pay

## 2015-11-16 ENCOUNTER — Telehealth: Payer: Self-pay | Admitting: Cardiology

## 2015-11-16 ENCOUNTER — Emergency Department (HOSPITAL_COMMUNITY)
Admission: EM | Admit: 2015-11-16 | Discharge: 2015-11-16 | Disposition: A | Discharge status: Home / Self Care | Payer: Federal, State, Local not specified - PPO | Attending: Emergency Medicine | Admitting: Emergency Medicine

## 2015-11-16 ENCOUNTER — Encounter: Payer: Self-pay | Admitting: Cardiology

## 2015-11-16 ENCOUNTER — Telehealth: Payer: Self-pay | Admitting: *Deleted

## 2015-11-16 DIAGNOSIS — I472 Ventricular tachycardia: Secondary | ICD-10-CM

## 2015-11-16 DIAGNOSIS — I2510 Atherosclerotic heart disease of native coronary artery without angina pectoris: Secondary | ICD-10-CM | POA: Diagnosis not present

## 2015-11-16 DIAGNOSIS — R42 Dizziness and giddiness: Secondary | ICD-10-CM | POA: Diagnosis present

## 2015-11-16 DIAGNOSIS — M545 Low back pain: Secondary | ICD-10-CM | POA: Diagnosis not present

## 2015-11-16 DIAGNOSIS — I5042 Chronic combined systolic (congestive) and diastolic (congestive) heart failure: Secondary | ICD-10-CM | POA: Insufficient documentation

## 2015-11-16 DIAGNOSIS — I110 Hypertensive heart disease with heart failure: Secondary | ICD-10-CM | POA: Insufficient documentation

## 2015-11-16 DIAGNOSIS — Z794 Long term (current) use of insulin: Secondary | ICD-10-CM | POA: Insufficient documentation

## 2015-11-16 DIAGNOSIS — Z79899 Other long term (current) drug therapy: Secondary | ICD-10-CM | POA: Insufficient documentation

## 2015-11-16 DIAGNOSIS — E119 Type 2 diabetes mellitus without complications: Secondary | ICD-10-CM | POA: Insufficient documentation

## 2015-11-16 DIAGNOSIS — Z7982 Long term (current) use of aspirin: Secondary | ICD-10-CM | POA: Diagnosis not present

## 2015-11-16 DIAGNOSIS — Z7984 Long term (current) use of oral hypoglycemic drugs: Secondary | ICD-10-CM | POA: Diagnosis not present

## 2015-11-16 DIAGNOSIS — I499 Cardiac arrhythmia, unspecified: Secondary | ICD-10-CM | POA: Insufficient documentation

## 2015-11-16 DIAGNOSIS — R531 Weakness: Secondary | ICD-10-CM | POA: Diagnosis not present

## 2015-11-16 LAB — I-STAT CHEM 8, ED
BUN: 8 mg/dL (ref 6–20)
CALCIUM ION: 1.19 mmol/L (ref 1.15–1.40)
CREATININE: 0.5 mg/dL (ref 0.44–1.00)
Chloride: 99 mmol/L — ABNORMAL LOW (ref 101–111)
Glucose, Bld: 170 mg/dL — ABNORMAL HIGH (ref 65–99)
HEMATOCRIT: 46 % (ref 36.0–46.0)
HEMOGLOBIN: 15.6 g/dL — AB (ref 12.0–15.0)
Potassium: 3.9 mmol/L (ref 3.5–5.1)
Sodium: 140 mmol/L (ref 135–145)
TCO2: 26 mmol/L (ref 0–100)

## 2015-11-16 LAB — MAGNESIUM: Magnesium: 2.1 mg/dL (ref 1.7–2.4)

## 2015-11-16 LAB — I-STAT TROPONIN, ED: TROPONIN I, POC: 0 ng/mL (ref 0.00–0.08)

## 2015-11-16 LAB — TSH: TSH: 2.101 u[IU]/mL (ref 0.350–4.500)

## 2015-11-16 NOTE — ED Notes (Signed)
Pt ambulated to bathroom with 1 nurse assist.

## 2015-11-16 NOTE — Telephone Encounter (Signed)
Received critical alert from [employer] regarding detected ventricular arrhythmia by home monitor. Our nursing staff called house, husband states patient is in the shower and will call us back as soon as she gets out. Telemetry strips reviewed, concerning for sustained VT. Asked nursing staff to contact patient again and asked to come to ER for evaluation. If no indentifiable cause of arrhythmia detected by electrolytes may need consideration for EP evaluation. Recent echo with normal LVEF on 11/03/15, baseline QTc around 460-470, cath 2015 with patent coronaries. Upon ER arrival will need EKG, bmet, Mg, cbc, TSH and please notify cardiology once results are back.     Dominga Ferry MD
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
## 2015-11-16 NOTE — ED Triage Notes (Signed)
Pt denies any cp or sob

## 2015-11-16 NOTE — Telephone Encounter (Signed)
[employer] called with critical notification on patient stating that 0300 this morning she was in NSR with PVC's. At [phone] pt went into V-tach and lead loss a 0704. [employer] called and was unable to reach patient. I called patient at 0930 and was told by husband that patient was in the shower and would return my call. Critical report shown to Dr. Wyline Mood who instructed to have pt seen in ED for eval. Today.  Called pt at 1000 and spoke with pt who states that last night before bed she felt the same way she did before she had her stroke. Pt reports she was dizzy but thought it may be something she ate. Pt states she went to bed around 23:30 or 00:30. Pt agrees with being seen in ED today.
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
## 2015-11-16 NOTE — ED Triage Notes (Signed)
Pt wearing heart monitor and was called this am by Dr Wyline Mood office and informed at 0300 today she had irregular heart rhythm. Pt was informed to come to ER by evaluation.

## 2015-11-18 ENCOUNTER — Encounter: Payer: Self-pay | Admitting: *Deleted

## 2015-11-18 ENCOUNTER — Ambulatory Visit (INDEPENDENT_AMBULATORY_CARE_PROVIDER_SITE_OTHER): Payer: Federal, State, Local not specified - PPO | Admitting: Internal Medicine

## 2015-11-18 ENCOUNTER — Encounter: Payer: Self-pay | Admitting: Internal Medicine

## 2015-11-18 DIAGNOSIS — I472 Ventricular tachycardia, unspecified: Secondary | ICD-10-CM
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
## 2015-11-18 NOTE — Patient Instructions (Signed)
Your physician has recommended that you have an EP Study. This test is used to assess serious arrhythmias (irregular heartbeats). During an Electro-physiology Study (EPS), a thin, flexible wire is passed through a vein in your groin (upper thigh) or neck up to the heart. The wire records the heart's electrical signals. Your doctor uses the wire to electrically stimulate your heart and trigger an arrhythmic. This allows the doctor to see whether an antiarrhythmia medicine can help manage the problem or if further procedures are necessary (i.e., ablation/ICD). Radiofrequency ablation, a procedure used to fix some types of arrthythmia, may be done during an EPS. This is done in the hospital and often requires an overnight stay. Please see the instruction sheet given to your today for more information.  Your physician recommends that you continue on your current medications as directed. Please refer to the Current Medication list given to you today.  If you need a refill on your cardiac medications before your next appointment, please call your pharmacy.  Thank you for choosing [facility]!

## 2015-11-21 ENCOUNTER — Ambulatory Visit (HOSPITAL_COMMUNITY)
Admission: RE | Admit: 2015-11-21 | Discharge: 2015-11-22 | Disposition: A | Discharge status: Home / Self Care | Payer: Federal, State, Local not specified - PPO | Source: Ambulatory Visit | Attending: Internal Medicine | Admitting: Internal Medicine

## 2015-11-21 ENCOUNTER — Encounter (HOSPITAL_COMMUNITY)
Admission: RE | Disposition: A | Discharge status: Home / Self Care | Payer: Self-pay | Source: Ambulatory Visit | Attending: Internal Medicine

## 2015-11-21 DIAGNOSIS — Z794 Long term (current) use of insulin: Secondary | ICD-10-CM | POA: Diagnosis not present

## 2015-11-21 DIAGNOSIS — Z7902 Long term (current) use of antithrombotics/antiplatelets: Secondary | ICD-10-CM | POA: Diagnosis not present

## 2015-11-21 DIAGNOSIS — I4892 Unspecified atrial flutter: Secondary | ICD-10-CM | POA: Diagnosis not present

## 2015-11-21 DIAGNOSIS — Z79899 Other long term (current) drug therapy: Secondary | ICD-10-CM | POA: Diagnosis not present

## 2015-11-21 DIAGNOSIS — Z6838 Body mass index (BMI) 38.0-38.9, adult: Secondary | ICD-10-CM | POA: Diagnosis not present

## 2015-11-21 DIAGNOSIS — E119 Type 2 diabetes mellitus without complications: Secondary | ICD-10-CM | POA: Insufficient documentation

## 2015-11-21 DIAGNOSIS — E669 Obesity, unspecified: Secondary | ICD-10-CM | POA: Insufficient documentation

## 2015-11-21 DIAGNOSIS — I471 Supraventricular tachycardia: Secondary | ICD-10-CM | POA: Diagnosis not present

## 2015-11-21 DIAGNOSIS — F329 Major depressive disorder, single episode, unspecified: Secondary | ICD-10-CM | POA: Insufficient documentation

## 2015-11-21 DIAGNOSIS — I5042 Chronic combined systolic (congestive) and diastolic (congestive) heart failure: Secondary | ICD-10-CM | POA: Insufficient documentation

## 2015-11-21 DIAGNOSIS — I2510 Atherosclerotic heart disease of native coronary artery without angina pectoris: Secondary | ICD-10-CM | POA: Insufficient documentation

## 2015-11-21 DIAGNOSIS — R000 Tachycardia, unspecified: Secondary | ICD-10-CM | POA: Diagnosis present

## 2015-11-21 DIAGNOSIS — M797 Fibromyalgia: Secondary | ICD-10-CM | POA: Diagnosis not present

## 2015-11-21 DIAGNOSIS — Z7982 Long term (current) use of aspirin: Secondary | ICD-10-CM | POA: Insufficient documentation

## 2015-11-21 DIAGNOSIS — I110 Hypertensive heart disease with heart failure: Secondary | ICD-10-CM | POA: Diagnosis not present

## 2015-11-21 DIAGNOSIS — I472 Ventricular tachycardia, unspecified: Secondary | ICD-10-CM

## 2015-11-21 DIAGNOSIS — Z8673 Personal history of transient ischemic attack (TIA), and cerebral infarction without residual deficits: Secondary | ICD-10-CM | POA: Diagnosis not present

## 2015-11-21 DIAGNOSIS — K219 Gastro-esophageal reflux disease without esophagitis: Secondary | ICD-10-CM | POA: Diagnosis not present

## 2015-11-21 HISTORY — PX: ELECTROPHYSIOLOGIC STUDY: SHX172A

## 2015-11-21 LAB — CBC
HEMATOCRIT: 45.6 % (ref 36.0–46.0)
Hemoglobin: 15 g/dL (ref 12.0–15.0)
MCH: 28.1 pg (ref 26.0–34.0)
MCHC: 32.9 g/dL (ref 30.0–36.0)
MCV: 85.4 fL (ref 78.0–100.0)
PLATELETS: 282 10*3/uL (ref 150–400)
RBC: 5.34 MIL/uL — ABNORMAL HIGH (ref 3.87–5.11)
RDW: 13 % (ref 11.5–15.5)
WBC: 9.5 10*3/uL (ref 4.0–10.5)

## 2015-11-21 LAB — GLUCOSE, CAPILLARY
GLUCOSE-CAPILLARY: 205 mg/dL — AB (ref 65–99)
Glucose-Capillary: 166 mg/dL — ABNORMAL HIGH (ref 65–99)
Glucose-Capillary: 124 mg/dL — ABNORMAL HIGH (ref 65–99)
Glucose-Capillary: 124 mg/dL — ABNORMAL HIGH (ref 65–99)

## 2015-11-21 LAB — BASIC METABOLIC PANEL
ANION GAP: 10 (ref 5–15)
BUN: 9 mg/dL (ref 6–20)
CALCIUM: 9.8 mg/dL (ref 8.9–10.3)
CO2: 27 mmol/L (ref 22–32)
CREATININE: 0.49 mg/dL (ref 0.44–1.00)
Chloride: 101 mmol/L (ref 101–111)
GLUCOSE: 161 mg/dL — AB (ref 65–99)
Potassium: 3.9 mmol/L (ref 3.5–5.1)
Sodium: 138 mmol/L (ref 135–145)

## 2015-11-21 LAB — SURGICAL PCR SCREEN
MRSA, PCR: NEGATIVE
Staphylococcus aureus: POSITIVE — AB

## 2015-11-21 SURGERY — ELECTROPHYSIOLOGY STUDY
Anesthesia: LOCAL

## 2015-11-21 MED ORDER — CEFAZOLIN SODIUM-DEXTROSE 2-4 GM/100ML-% IV SOLN: 2.0000 g | INTRAVENOUS | Status: DC

## 2015-11-21 MED ORDER — MIDAZOLAM HCL 5 MG/5ML IJ SOLN
INTRAMUSCULAR | Status: AC
  Filled 2015-11-21: qty 5

## 2015-11-21 MED ORDER — DULOXETINE HCL 20 MG PO CPEP
20.0000 mg | ORAL_CAPSULE | Freq: Every day | ORAL | Status: DC
  Administered 2015-11-21 – 2015-11-22 (×2): 20 mg via ORAL
  Filled 2015-11-21 (×2): qty 1

## 2015-11-21 MED ORDER — METOPROLOL TARTRATE 5 MG/5ML IV SOLN
5.0000 mg | Freq: Four times a day (QID) | INTRAVENOUS | Status: DC
  Administered 2015-11-21: 5 mg via INTRAVENOUS
  Filled 2015-11-21: qty 5

## 2015-11-21 MED ORDER — CHLORHEXIDINE GLUCONATE 4 % EX LIQD: 60.0000 mL | Freq: Once | CUTANEOUS | Status: DC

## 2015-11-21 MED ORDER — HEPARIN (PORCINE) IN NACL 2-0.9 UNIT/ML-% IJ SOLN
INTRAMUSCULAR | Status: DC | PRN
  Administered 2015-11-21: 14:00:00

## 2015-11-21 MED ORDER — FERROUS SULFATE 325 (65 FE) MG PO TABS
325.0000 mg | ORAL_TABLET | Freq: Every day | ORAL | Status: DC
  Administered 2015-11-22: 08:00:00 325 mg via ORAL
  Filled 2015-11-21: qty 1

## 2015-11-21 MED ORDER — MUPIROCIN 2 % EX OINT
1.0000 "application " | TOPICAL_OINTMENT | Freq: Two times a day (BID) | CUTANEOUS | Status: DC
  Administered 2015-11-21: 1 via NASAL
  Filled 2015-11-21: qty 22

## 2015-11-21 MED ORDER — MIDAZOLAM HCL 5 MG/5ML IJ SOLN
INTRAMUSCULAR | Status: DC | PRN
  Administered 2015-11-21: 2 mg via INTRAVENOUS
  Administered 2015-11-21 (×2): 1 mg via INTRAVENOUS
  Administered 2015-11-21 (×3): 2 mg via INTRAVENOUS
  Administered 2015-11-21 (×2): 1 mg via INTRAVENOUS

## 2015-11-21 MED ORDER — SODIUM CHLORIDE 0.9% FLUSH
3.0000 mL | Freq: Two times a day (BID) | INTRAVENOUS | Status: DC
  Administered 2015-11-21: 3 mL via INTRAVENOUS

## 2015-11-21 MED ORDER — LINAGLIPTIN 5 MG PO TABS
5.0000 mg | ORAL_TABLET | Freq: Every day | ORAL | Status: DC
  Administered 2015-11-22: 5 mg via ORAL
  Filled 2015-11-21: qty 1

## 2015-11-21 MED ORDER — TRAMADOL HCL 50 MG PO TABS: 50.0000 mg | ORAL_TABLET | Freq: Four times a day (QID) | ORAL | Status: DC | PRN

## 2015-11-21 MED ORDER — ONDANSETRON HCL 4 MG/2ML IJ SOLN: 4.0000 mg | Freq: Four times a day (QID) | INTRAMUSCULAR | Status: DC | PRN

## 2015-11-21 MED ORDER — FENTANYL CITRATE (PF) 100 MCG/2ML IJ SOLN
INTRAMUSCULAR | Status: AC
  Filled 2015-11-21: qty 2

## 2015-11-21 MED ORDER — PAROXETINE HCL 20 MG PO TABS
40.0000 mg | ORAL_TABLET | Freq: Every day | ORAL | Status: DC
  Administered 2015-11-21 – 2015-11-22 (×2): 40 mg via ORAL
  Filled 2015-11-21 (×2): qty 2

## 2015-11-21 MED ORDER — DEXTROSE 5 % IV SOLN
INTRAVENOUS | Status: DC | PRN
  Administered 2015-11-21: 4 ug/min via INTRAVENOUS

## 2015-11-21 MED ORDER — NITROGLYCERIN 0.4 MG SL SUBL: 0.4000 mg | SUBLINGUAL_TABLET | SUBLINGUAL | Status: DC | PRN

## 2015-11-21 MED ORDER — FUROSEMIDE 20 MG PO TABS
20.0000 mg | ORAL_TABLET | Freq: Every day | ORAL | Status: DC
  Administered 2015-11-22: 20 mg via ORAL
  Filled 2015-11-21 (×2): qty 1

## 2015-11-21 MED ORDER — CARVEDILOL 25 MG PO TABS
25.0000 mg | ORAL_TABLET | Freq: Two times a day (BID) | ORAL | Status: DC
  Administered 2015-11-21 – 2015-11-22 (×2): 25 mg via ORAL
  Filled 2015-11-21 (×2): qty 1
  Filled 2015-11-21: qty 2

## 2015-11-21 MED ORDER — SODIUM CHLORIDE 0.9% FLUSH: 3.0000 mL | INTRAVENOUS | Status: DC | PRN

## 2015-11-21 MED ORDER — LIRAGLUTIDE 18 MG/3ML ~~LOC~~ SOPN: 1.8000 mg | PEN_INJECTOR | Freq: Every day | SUBCUTANEOUS | Status: DC

## 2015-11-21 MED ORDER — MUPIROCIN 2 % EX OINT
1.0000 "application " | TOPICAL_OINTMENT | Freq: Once | CUTANEOUS | Status: AC
  Administered 2015-11-21: 1 via TOPICAL
  Filled 2015-11-21: qty 22

## 2015-11-21 MED ORDER — ISOPROTERENOL HCL 0.2 MG/ML IJ SOLN
INTRAMUSCULAR | Status: AC
  Filled 2015-11-21: qty 5

## 2015-11-21 MED ORDER — BUPIVACAINE HCL (PF) 0.25 % IJ SOLN
INTRAMUSCULAR | Status: DC | PRN
  Administered 2015-11-21: 40 mL

## 2015-11-21 MED ORDER — LORATADINE 10 MG PO TABS
10.0000 mg | ORAL_TABLET | Freq: Every day | ORAL | Status: DC
  Administered 2015-11-22: 10 mg via ORAL
  Filled 2015-11-21 (×2): qty 1

## 2015-11-21 MED ORDER — CLOPIDOGREL BISULFATE 75 MG PO TABS: 75.0000 mg | ORAL_TABLET | Freq: Every day | ORAL | Status: DC

## 2015-11-21 MED ORDER — GABAPENTIN 100 MG PO CAPS
200.0000 mg | ORAL_CAPSULE | Freq: Two times a day (BID) | ORAL | Status: DC
  Administered 2015-11-21 – 2015-11-22 (×2): 200 mg via ORAL
  Filled 2015-11-21 (×2): qty 2

## 2015-11-21 MED ORDER — CANAGLIFLOZIN 300 MG PO TABS
300.0000 mg | ORAL_TABLET | Freq: Every day | ORAL | Status: DC
  Administered 2015-11-22: 09:00:00 300 mg via ORAL
  Filled 2015-11-21 (×2): qty 1

## 2015-11-21 MED ORDER — ASPIRIN 325 MG PO TABS: 325.0000 mg | ORAL_TABLET | Freq: Every day | ORAL | Status: DC

## 2015-11-21 MED ORDER — SODIUM CHLORIDE 0.9 % IV SOLN: 250.0000 mL | INTRAVENOUS | Status: DC | PRN

## 2015-11-21 MED ORDER — INSULIN ASPART 100 UNIT/ML ~~LOC~~ SOLN: 0.0000 [IU] | Freq: Every day | SUBCUTANEOUS | Status: DC

## 2015-11-21 MED ORDER — INSULIN ASPART 100 UNIT/ML ~~LOC~~ SOLN: 0.0000 [IU] | Freq: Three times a day (TID) | SUBCUTANEOUS | Status: DC

## 2015-11-21 MED ORDER — METFORMIN HCL ER 500 MG PO TB24
1000.0000 mg | ORAL_TABLET | Freq: Every day | ORAL | Status: DC
  Administered 2015-11-22: 1000 mg via ORAL
  Filled 2015-11-21: qty 2

## 2015-11-21 MED ORDER — ACETAMINOPHEN 325 MG PO TABS: 650.0000 mg | ORAL_TABLET | ORAL | Status: DC | PRN

## 2015-11-21 MED ORDER — APIXABAN 5 MG PO TABS
5.0000 mg | ORAL_TABLET | Freq: Two times a day (BID) | ORAL | Status: DC
  Administered 2015-11-21 – 2015-11-22 (×2): 5 mg via ORAL
  Filled 2015-11-21 (×3): qty 1

## 2015-11-21 MED ORDER — MONTELUKAST SODIUM 10 MG PO TABS
10.0000 mg | ORAL_TABLET | Freq: Every day | ORAL | Status: DC
  Administered 2015-11-22: 10 mg via ORAL
  Filled 2015-11-21: qty 1

## 2015-11-21 MED ORDER — GENTAMICIN SULFATE 40 MG/ML IJ SOLN: 80.0000 mg | INTRAMUSCULAR | Status: DC

## 2015-11-21 MED ORDER — ALPRAZOLAM 0.25 MG PO TABS
0.2500 mg | ORAL_TABLET | Freq: Every evening | ORAL | Status: DC | PRN
  Administered 2015-11-21: 0.5 mg via ORAL
  Filled 2015-11-21: qty 2

## 2015-11-21 MED ORDER — DOCUSATE SODIUM 100 MG PO CAPS
100.0000 mg | ORAL_CAPSULE | Freq: Every day | ORAL | Status: DC
  Administered 2015-11-21 – 2015-11-22 (×2): 100 mg via ORAL
  Filled 2015-11-21 (×3): qty 1

## 2015-11-21 MED ORDER — QUINAPRIL HCL 10 MG PO TABS
20.0000 mg | ORAL_TABLET | Freq: Every day | ORAL | Status: DC
  Administered 2015-11-22: 20 mg via ORAL
  Filled 2015-11-21: qty 2

## 2015-11-21 MED ORDER — SITAGLIP PHOS-METFORMIN HCL ER 100-1000 MG PO TB24: 1.0000 | ORAL_TABLET | Freq: Every day | ORAL | Status: DC

## 2015-11-21 MED ORDER — HEPARIN (PORCINE) IN NACL 2-0.9 UNIT/ML-% IJ SOLN
INTRAMUSCULAR | Status: AC
  Filled 2015-11-21: qty 500

## 2015-11-21 MED ORDER — SODIUM CHLORIDE 0.9 % IV SOLN
INTRAVENOUS | Status: DC
  Administered 2015-11-21: 13:00:00 via INTRAVENOUS

## 2015-11-21 MED ORDER — BUPIVACAINE HCL (PF) 0.25 % IJ SOLN
INTRAMUSCULAR | Status: AC
  Filled 2015-11-21: qty 60

## 2015-11-21 MED ORDER — INSULIN ASPART 100 UNIT/ML ~~LOC~~ SOLN
0.0000 [IU] | Freq: Three times a day (TID) | SUBCUTANEOUS | Status: DC
  Administered 2015-11-21: 2 [IU] via SUBCUTANEOUS
  Administered 2015-11-22: 07:00:00 3 [IU] via SUBCUTANEOUS

## 2015-11-21 MED ORDER — INSULIN DEGLUDEC 200 UNIT/ML ~~LOC~~ SOPN: 68.0000 [IU] | PEN_INJECTOR | Freq: Every day | SUBCUTANEOUS | Status: DC

## 2015-11-21 MED ORDER — ATORVASTATIN CALCIUM 80 MG PO TABS
80.0000 mg | ORAL_TABLET | Freq: Every day | ORAL | Status: DC
  Administered 2015-11-21: 18:00:00 80 mg via ORAL
  Filled 2015-11-21: qty 1

## 2015-11-21 MED ORDER — LIVING BETTER WITH HEART FAILURE BOOK
Freq: Once | Status: AC
  Administered 2015-11-22

## 2015-11-21 MED ORDER — MUPIROCIN 2 % EX OINT
TOPICAL_OINTMENT | CUTANEOUS | Status: AC
  Administered 2015-11-21: 1 via TOPICAL
  Filled 2015-11-21: qty 22

## 2015-11-21 MED ORDER — FENTANYL CITRATE (PF) 100 MCG/2ML IJ SOLN
INTRAMUSCULAR | Status: DC | PRN
  Administered 2015-11-21: 25 ug via INTRAVENOUS
  Administered 2015-11-21: 12.5 ug via INTRAVENOUS
  Administered 2015-11-21: 25 ug via INTRAVENOUS
  Administered 2015-11-21: 12.5 ug via INTRAVENOUS
  Administered 2015-11-21: 25 ug via INTRAVENOUS
  Administered 2015-11-21: 12.5 ug via INTRAVENOUS
  Administered 2015-11-21 (×2): 25 ug via INTRAVENOUS

## 2015-11-21 SURGICAL SUPPLY — 10 items
BAG SNAP BAND KOVER 36X36 (MISCELLANEOUS) ×2
CATH CELSIUS THERM D CV 7F (ABLATOR) ×2
CATH JOSEPH QUAD ALLRED 6F REP (CATHETERS) ×4
CATH POLARIS X 2.5/5/2.5 DECAP (CATHETERS) ×2
DRAPE BRACHIAL (DRAPES) ×2
PACK EP LATEX FREE (CUSTOM PROCEDURE TRAY) ×2
PACK EP LF (CUSTOM PROCEDURE TRAY) ×1
PAD DEFIB LIFELINK (PAD) ×2
SHEATH PINNACLE 6F 10CM (SHEATH) ×4
SHEATH PINNACLE 8F 10CM (SHEATH) ×2
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
## 2015-11-21 NOTE — Progress Notes (Signed)
Site area: Rt fem vein x3 Site Prior to Removal:  Level 0 Pressure Applied For:15 min Manual:   yes Patient Status During Pull:  A/O Post Pull Site:  Level 0 Post Pull Instructions Given:  Yes, Pt understands instructions Post Pull Pulses Present: 2+ Rt DP  Dressing Applied:  tegaderm and a 4x4 Bedrest begins @ 16:50:00 Comments: Pt leaves cath lab holding area in stable condition. Rt groin is unremarkable.Dressing is CDI.

## 2015-11-21 NOTE — Interval H&P Note (Signed)
History and Physical Interval Note:  11/21/2015 12:20 PM  Vanessa Silva  has presented today for surgery, with the diagnosis of vtach  The various methods of treatment have been discussed with the patient and family. After consideration of risks, benefits and other options for treatment, the patient has consented to  Procedure(s): Electrophysiology Study/possible ablation/loop/ICD (N/A) as a surgical intervention .  The patient's history has been reviewed, patient examined, no change in status, stable for surgery.  I have reviewed the patient's chart and labs.  Questions were answered to the patient's satisfaction.     Lewayne Bunting

## 2015-11-22 ENCOUNTER — Encounter (HOSPITAL_COMMUNITY): Payer: Self-pay | Admitting: Internal Medicine

## 2015-11-22 DIAGNOSIS — Z6838 Body mass index (BMI) 38.0-38.9, adult: Secondary | ICD-10-CM | POA: Diagnosis not present

## 2015-11-22 DIAGNOSIS — Z7982 Long term (current) use of aspirin: Secondary | ICD-10-CM | POA: Diagnosis not present

## 2015-11-22 DIAGNOSIS — Z79899 Other long term (current) drug therapy: Secondary | ICD-10-CM | POA: Diagnosis not present

## 2015-11-22 DIAGNOSIS — I4892 Unspecified atrial flutter: Secondary | ICD-10-CM | POA: Diagnosis not present

## 2015-11-22 DIAGNOSIS — Z794 Long term (current) use of insulin: Secondary | ICD-10-CM | POA: Diagnosis not present

## 2015-11-22 DIAGNOSIS — M797 Fibromyalgia: Secondary | ICD-10-CM | POA: Diagnosis not present

## 2015-11-22 DIAGNOSIS — Z7902 Long term (current) use of antithrombotics/antiplatelets: Secondary | ICD-10-CM | POA: Diagnosis not present

## 2015-11-22 DIAGNOSIS — I110 Hypertensive heart disease with heart failure: Secondary | ICD-10-CM | POA: Diagnosis not present

## 2015-11-22 DIAGNOSIS — E669 Obesity, unspecified: Secondary | ICD-10-CM | POA: Diagnosis not present

## 2015-11-22 DIAGNOSIS — E119 Type 2 diabetes mellitus without complications: Secondary | ICD-10-CM | POA: Diagnosis not present

## 2015-11-22 DIAGNOSIS — Z8673 Personal history of transient ischemic attack (TIA), and cerebral infarction without residual deficits: Secondary | ICD-10-CM | POA: Diagnosis not present

## 2015-11-22 DIAGNOSIS — I5042 Chronic combined systolic (congestive) and diastolic (congestive) heart failure: Secondary | ICD-10-CM | POA: Diagnosis not present

## 2015-11-22 DIAGNOSIS — K219 Gastro-esophageal reflux disease without esophagitis: Secondary | ICD-10-CM | POA: Diagnosis not present

## 2015-11-22 DIAGNOSIS — F329 Major depressive disorder, single episode, unspecified: Secondary | ICD-10-CM | POA: Diagnosis not present

## 2015-11-22 DIAGNOSIS — I471 Supraventricular tachycardia: Secondary | ICD-10-CM | POA: Diagnosis not present

## 2015-11-22 DIAGNOSIS — I2510 Atherosclerotic heart disease of native coronary artery without angina pectoris: Secondary | ICD-10-CM | POA: Diagnosis not present

## 2015-11-22 LAB — GLUCOSE, CAPILLARY: GLUCOSE-CAPILLARY: 194 mg/dL — AB (ref 65–99)

## 2015-11-22 MED ORDER — METOPROLOL TARTRATE 5 MG/5ML IV SOLN
INTRAVENOUS | Status: AC
  Filled 2015-11-22: qty 5

## 2015-11-22 MED ORDER — OFF THE BEAT BOOK
Freq: Once | Status: AC
  Administered 2015-11-22: 08:00:00
  Filled 2015-11-22: qty 1

## 2015-11-22 MED ORDER — METOPROLOL TARTRATE 5 MG/5ML IV SOLN: 5.0000 mg | Freq: Four times a day (QID) | INTRAVENOUS | Status: DC

## 2015-11-22 MED ORDER — APIXABAN 5 MG PO TABS: 5.0000 mg | ORAL_TABLET | Freq: Two times a day (BID) | ORAL | 3 refills | Status: DC

## 2015-11-22 NOTE — Progress Notes (Signed)
CHF education done with patient.  Discussed the importance of limiting sodium intake using cans of Tomato and Chicken Noodle soup to show sodium content in typical foods.  Also used cereal and milk to show hidden salt in foods.  Educated pt on the importance of limiting fluid intake and hidden sources of fluid, such as Jello, Pudding, Ice Cream that also need to be totaled in the daily fluids.    Handouts: Living Better with Heart Failure Heart Failure by WebMD Living with Heart Failure by Heart.org

## 2015-11-22 NOTE — Discharge Instructions (Signed)
No driving for 1 week. No lifting over 5 lbs for 1 week. No vigorous or sexual activity for 1 week. You may return to work on 11/28/15. Keep procedure site clean & dry. If you notice increased pain, swelling, bleeding or pus, call/return!  You may shower, but no soaking baths/hot tubs/pools for 1 week.

## 2015-11-22 NOTE — Discharge Summary (Signed)
ELECTROPHYSIOLOGY PROCEDURE DISCHARGE SUMMARY    Patient ID: Vanessa Silva,  MRN: [id number], DOB/AGE: 53/06/1960 53 y.o.  Admit date: 11/21/2015 Discharge date: 11/22/2015  Primary Care Physician: Pershing ProudJACKSON,SAMANTHA, PA-C Primary Cardiologist: Dr. Wyline MoodBranch Electrophysiologist: Dr. Ladona Ridgeltaylor  Primary Discharge Diagnosis:  1. AVNRT 2. PAFlutter (left sided)     CHA2DS2Vasc is at least 5, started on Eliquis  Secondary Discharge Diagnosis:  1. HTN 2. Hx of CVA 3. DM  Allergies  Allergen Reactions  . Chlorhexidine Gluconate Hives    Skin rash after CHG wipes  . Demerol [Meperidine] Nausea And Vomiting  . Morphine And Related Nausea And Vomiting  . Promethazine Other (See Comments)    No reaction listed     Procedures This Admission: 1.  Electrophysiology study and radiofrequency catheter ablation on 11/21/15 by Dr Ladona Ridgelaylor.   Conclusion: 1. Inducible AV node reentrant tachycardia status post successful ablation 2. Inducible left atrial flutter, paced terminable, cycle length 220 ms. 3. Prolongation of the PR interval post ablation of no clear cut clinical consequence  Brief HPI: Vanessa Silva is a 55 y.o. female with a past medical history as outlined above.  She was noted outpatient while wearing a cardiac monitor, a wide QRS tachycardia with the same QRS orientation although the duration of the QRS was longer. The rates was around 250/min. She was referred to EP in the out patient setting and recommended she undergo EPS for further evaluation and management  Hospital Course:  The patient was admitted and underwent EPS/RFCA with details as outlined above. She was monitored on telemetry post procedure with SVT 130's settled overnight and remainedin SR/ST, with intermittent Mobitz 1, no bradycardic rates,  Rates generally about 100bpm, her BB will be continued.  Given her AFlutter hx of CVA her ASA/plavix stopped and Eliquis started, Dr. Ladona Ridgelaylor had a lengthy discussion with  the patient regarding this, as well as her EP findings/procedure results  Procedure sites were without complication.  She was examined by Dr. Ladona Ridgelaylor and considered stable for discharge to home.  Follow up will be arranged in 4 weeks.  Wound care and restrictions were reviewed with the patient prior to discharge.   Physical Exam: Vitals:   11/21/15 2033 11/21/15 2148 11/21/15 2200 11/22/15 0656  BP:  120/73 117/80 (!) 144/66  Pulse: (!) 115   (!) 108  Resp: (!) 29 (!) 24 14 20   Temp: 98.4 F (36.9 C)   98.2 F (36.8 C)  TempSrc: Oral   Oral  SpO2: 98%   98%  Weight:    254 lb 10.1 oz (115.5 kg)  Height:        GEN- The patient is well appearing, alert and oriented x 3 today.   HEENT: normocephalic, atraumatic; sclera clear, conjunctiva pink; hearing intact; oropharynx clear; neck supple, no JVP Lymph- no cervical lymphadenopathy Lungs- Clear to ausculation bilaterally, normal work of breathing.  No wheezes, rales, rhonchi Heart- RRR, no murmurs, rubs or gallops, PMI not laterally displaced GI- soft, non-tender, non-distended Extremities- no clubbing, cyanosis, or edema; groin without hematoma/bruit MS- no significant deformity or atrophy Skin- warm and dry, no rash or lesion Psych- euthymic mood, full affect Neuro- strength and sensation are intact   Discharge Vitals: Blood pressure (!) 144/66, pulse (!) 108, temperature 98.2 F (36.8 C), temperature source Oral, resp. rate 20, height 5\' 8"  (1.727 m), weight 254 lb 10.1 oz (115.5 kg)  Labs:   Lab Results  Component Value Date   WBC 9.5  11/21/2015   HGB 15.0 11/21/2015   HCT 45.6 11/21/2015   MCV 85.4 11/21/2015   PLT 282 11/21/2015     Recent Labs Lab 11/21/15 1225  NA 138  K 3.9  CL 101  CO2 27  BUN 9  CREATININE 0.49  CALCIUM 9.8  GLUCOSE 161*    Discharge Medications:    Medication List    STOP taking these medications   aspirin 325 MG tablet   clopidogrel 75 MG tablet Commonly known as:  PLAVIX      TAKE these medications   ALPRAZolam 0.5 MG tablet Commonly known as:  XANAX Take 0.25-0.5 mg by mouth at bedtime as needed for anxiety or sleep.   apixaban 5 MG Tabs tablet Commonly known as:  ELIQUIS Take 1 tablet (5 mg total) by mouth 2 (two) times daily.   atorvastatin 80 MG tablet Commonly known as:  LIPITOR Take 1 tablet (80 mg total) by mouth daily.   carvedilol 25 MG tablet Commonly known as:  COREG Take 1 tablet (25 mg total) by mouth 2 (two) times daily with a meal.   docusate sodium 100 MG capsule Commonly known as:  COLACE Take 100 mg by mouth daily.   DULoxetine 30 MG capsule Commonly known as:  CYMBALTA TAKE ONE CAPSULE ONCE DAILY AT BEDTIME   esomeprazole 20 MG capsule Commonly known as:  NEXIUM Take 20 mg by mouth 2 (two) times daily before a meal.   ferrous sulfate 325 (65 FE) MG EC tablet Take 325 mg by mouth daily with breakfast.   furosemide 20 MG tablet Commonly known as:  LASIX TAKE 1 TABLET BY MOUTH DAILY   gabapentin 100 MG capsule Commonly known as:  NEURONTIN Take 200 mg by mouth 2 (two) times daily.   insulin aspart 100 UNIT/ML injection Commonly known as:  novoLOG Inject 0-9 Units into the skin 3 (three) times daily with meals. What changed:  when to take this  additional instructions  Another medication with the same name was removed. Continue taking this medication, and follow the directions you see here. Notes to patient:  No changes are being made to your insuline regime, please continue to take as you have been directed.   INVOKANA 300 MG Tabs tablet Generic drug:  canagliflozin Take 300 mg by mouth daily.   JANUMET XR [phone] MG Tb24 Generic drug:  SitaGLIPtin-MetFORMIN HCl Take 1 tablet by mouth daily.   loratadine 10 MG tablet Commonly known as:  CLARITIN Take 10 mg by mouth daily.   montelukast 10 MG tablet Commonly known as:  SINGULAIR Take 10 mg by mouth daily.   nitroGLYCERIN 0.4 MG SL tablet Commonly known  as:  NITROSTAT Place 1 tablet (0.4 mg total) under the tongue every 5 (five) minutes as needed for chest pain.   PARoxetine 40 MG tablet Commonly known as:  PAXIL Take 40 mg by mouth daily.   POTASSIMIN PO Take 1 tablet by mouth daily.   quinapril 20 MG tablet Commonly known as:  ACCUPRIL Take 20 mg by mouth daily.   traMADol 50 MG tablet Commonly known as:  ULTRAM Take 1 tablet (50 mg total) by mouth every 6 (six) hours as needed for moderate pain. What changed:  when to take this   TRESIBA FLEXTOUCH 200 UNIT/ML Sopn Generic drug:  Insulin Degludec INJECT 68 UNITS SUBCUTANEOUSLY EVERY DAY AT BEDTIME   VICTOZA 18 MG/3ML Sopn Generic drug:  Liraglutide Inject 1.8 mg into the skin at bedtime.   VITAMIN  B-12 PO Take 1 tablet by mouth daily.   VITAMIN D3 PO Take 1 capsule by mouth daily.       Disposition: Home  Follow-up Information    Lewayne Bunting, MD Follow up on 12/29/2015.   Specialty:  Cardiology Why:  10:15AM Contact information: [address] [place] [address] [phone]           Duration of Discharge Encounter: Greater than 30 minutes including physician time.  Norma Fredrickson, PA-C 11/22/2015 8:23 AM  EP Attending  Patient seen and examined. Agree with above. The patient is stable for DC home. I will see her back in 3-6 weeks in [place].  Leonia Reeves.D.

## 2015-11-22 NOTE — Care Management Note (Addendum)
Case Management Note  Patient Details  Name: [name] MRN: [id number] Date of Birth: Feb 21, 1961  Subjective/Objective:   S/p AV node ablation,  Patient is from home,pta indep.  Will be on eliquis, benefit check done, co pay is $0.  Patient states this will be thru December , NCM gave her 30 day free card and $10 co pay card in case she needs to use those in December.    S/W MARIS @ [facility] # [phone]   ELIQUIS  5 MG BID  ( 30 ) 60 TAB   COVER- YES  CO-PAY- ZERO DOLLARS  TIER- 2 DRUG  PRIOR APPROVAL - NO  PHARMACY : [facility]   PATIENT HAS A REFILL WAITING FOR TO PICK UP AT [facility].  NEXT REFILL FOR 12/15/15  PATIENT HAS MET HER CATASTROTHIC                      Action/Plan:   Expected Discharge Date:                  Expected Discharge Plan:  Home/Self Care  In-House Referral:     Discharge planning Services  CM Consult  Post Acute Care Choice:    Choice offered to:     DME Arranged:    DME Agency:     HH Arranged:    HH Agency:     Status of Service:  Completed, signed off  If discussed at [employer] of Stay Meetings, dates discussed:    Additional Comments:  Zenon Mayo, RN 11/22/2015, 10:13 AM

## 2015-11-24 ENCOUNTER — Telehealth: Payer: Self-pay | Admitting: Internal Medicine

## 2015-11-24 NOTE — Telephone Encounter (Signed)
Pt called stating she is extremely weak after having an ablation and has gained a little weight since being discharged from the hospital. She would like to know if she need to make some medication changes. Please give her a call @ [phone]

## 2015-11-24 NOTE — Telephone Encounter (Signed)
Will forward to dr taylor

## 2015-11-25 ENCOUNTER — Telehealth: Payer: Self-pay | Admitting: Internal Medicine

## 2015-11-25 NOTE — Telephone Encounter (Signed)
New Message °  °Pt voiced she has a letter stating her return to work date is 11/28/2015 but Renee Ursuy voiced 11/29/2015. °  °Pt voiced she is a little confused and needs clarification. °  °Pt wants to know if the letter could be emailed or faxed to heart center in [place], [place], at  °  °Please f/u with pt °

## 2015-11-25 NOTE — Telephone Encounter (Signed)
Started Lipitor a couple weeks ago, now can barely lift arms, had trouble with drug  4-5 years.Please advise

## 2015-11-25 NOTE — Telephone Encounter (Signed)
ROUTED MESSAGE TO URSUY FOR CLARIFICATION PT WAS SEEN IN HOSPITAL NOT IN [facility]

## 2015-11-25 NOTE — Telephone Encounter (Signed)
New Message  Pt voiced she has a letter stating her return to work date is 11/28/2015 but Francis Dowse voiced 11/29/2015.  Pt voiced she is a little confused and needs clarification.  Pt wants to know if the letter could be emailed or faxed to heart center in [place], [place], at [facility]  Please f/u with pt

## 2015-11-28 MED ORDER — PRAVASTATIN SODIUM 20 MG PO TABS
20.0000 mg | ORAL_TABLET | Freq: Every evening | ORAL | 3 refills | Status: DC
Start: 2015-11-28 — End: 2017-01-14

## 2015-11-28 NOTE — Telephone Encounter (Signed)
Can stop atorvastatin, can restart pravasttin 20mg  daily for now.   J Fausto Sampedro MD

## 2015-11-28 NOTE — Telephone Encounter (Signed)
I would discuss with Dr Wyline Mood as he is her primary Cardiologist.   Looks like she is due for OV in Oct with Dr Wyline Mood

## 2015-11-28 NOTE — Addendum Note (Signed)
Addended by: Abelino Derrick R on: 11/28/2015 12:52 PM   Modules accepted: Orders

## 2015-11-28 NOTE — Telephone Encounter (Signed)
Reviewed letter r/t returning to work after hospitalization. Letter states date to return 11/28/15. Pt said she went to work today, so no need for any additional follow-up.

## 2015-11-28 NOTE — Telephone Encounter (Signed)
Pt took herself off atorvastatin for the last several days and still has muscle weakness. She asked me to list it as a allergy as she had trouble with it in past.She wants to go back on what she was on before last hospitalization or any other statin you think may work    Will forward to dr [employer]

## 2015-11-28 NOTE — Telephone Encounter (Signed)
Left pt message on private voicemail. Discontinued atorvastatin and sent in a NEW RX for pravastatin 20 mg daily.

## 2015-12-24 ENCOUNTER — Other Ambulatory Visit: Payer: Self-pay | Admitting: Cardiology

## 2015-12-29 ENCOUNTER — Encounter: Payer: Self-pay | Admitting: Internal Medicine

## 2015-12-29 ENCOUNTER — Ambulatory Visit (INDEPENDENT_AMBULATORY_CARE_PROVIDER_SITE_OTHER): Payer: Federal, State, Local not specified - PPO | Admitting: Internal Medicine

## 2015-12-29 ENCOUNTER — Encounter: Payer: Self-pay | Admitting: *Deleted

## 2015-12-29 VITALS — BP 114/70 | HR 83 | Ht 68.0 in | Wt 253.0 lb

## 2015-12-29 DIAGNOSIS — R002 Palpitations: Secondary | ICD-10-CM
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
## 2015-12-29 NOTE — Patient Instructions (Signed)
Your physician recommends that you schedule a follow-up appointment in: 3-4 Months with Dr.Taylor.  Your physician recommends that you continue on your current medications as directed. Please refer to the Current Medication list given to you today.  If you need a refill on your cardiac medications before your next appointment, please call your pharmacy.  You have been given a work note for today.   Thank you for choosing [facility]!

## 2016-02-17 ENCOUNTER — Other Ambulatory Visit: Payer: Self-pay | Admitting: Physician Assistant

## 2016-02-17 ENCOUNTER — Other Ambulatory Visit: Payer: Self-pay | Admitting: Adult Health

## 2016-03-02 ENCOUNTER — Other Ambulatory Visit: Payer: Self-pay | Admitting: Adult Health

## 2016-03-04 ENCOUNTER — Other Ambulatory Visit: Payer: Self-pay | Admitting: Physician Assistant

## 2016-03-19 ENCOUNTER — Telehealth: Payer: Self-pay | Admitting: Internal Medicine

## 2016-03-19 MED ORDER — APIXABAN 5 MG PO TABS: ORAL_TABLET | ORAL | 3 refills | Status: DC

## 2016-03-19 NOTE — Telephone Encounter (Signed)
Pt would like to know if she can get some samples of Eliquis, she has only 4 pills left till she runs out

## 2016-03-19 NOTE — Telephone Encounter (Signed)
° °  1. Which medications need to be refilled? (please list name of each medication and dose if known)  ELIQUIS 5 MG TABS tablet [[id number]]    2. Which pharmacy/location (including street and city if local pharmacy) is medication to be sent to? [facility], mail order  3. Do they need a 30 day or 90 day supply?  90 day

## 2016-03-19 NOTE — Telephone Encounter (Signed)
Eliquis 5 mg 1 box given lot [id number],exp3/2020 given to pt

## 2016-03-28 DIAGNOSIS — K080 Exfoliation of teeth due to systemic causes: Secondary | ICD-10-CM | POA: Diagnosis not present

## 2016-05-03 ENCOUNTER — Ambulatory Visit: Payer: Federal, State, Local not specified - PPO | Admitting: Internal Medicine

## 2016-05-24 DIAGNOSIS — E11319 Type 2 diabetes mellitus with unspecified diabetic retinopathy without macular edema: Secondary | ICD-10-CM | POA: Diagnosis not present

## 2016-05-28 DIAGNOSIS — Z1389 Encounter for screening for other disorder: Secondary | ICD-10-CM | POA: Diagnosis not present

## 2016-05-28 DIAGNOSIS — K219 Gastro-esophageal reflux disease without esophagitis: Secondary | ICD-10-CM | POA: Diagnosis not present

## 2016-05-28 DIAGNOSIS — I10 Essential (primary) hypertension: Secondary | ICD-10-CM | POA: Diagnosis not present

## 2016-05-28 DIAGNOSIS — Z6839 Body mass index (BMI) 39.0-39.9, adult: Secondary | ICD-10-CM | POA: Diagnosis not present

## 2016-05-28 DIAGNOSIS — E1165 Type 2 diabetes mellitus with hyperglycemia: Secondary | ICD-10-CM | POA: Diagnosis not present

## 2016-05-29 DIAGNOSIS — H43811 Vitreous degeneration, right eye: Secondary | ICD-10-CM | POA: Diagnosis not present

## 2016-05-29 DIAGNOSIS — H3562 Retinal hemorrhage, left eye: Secondary | ICD-10-CM | POA: Diagnosis not present

## 2016-05-29 DIAGNOSIS — E113513 Type 2 diabetes mellitus with proliferative diabetic retinopathy with macular edema, bilateral: Secondary | ICD-10-CM | POA: Diagnosis not present

## 2016-05-29 DIAGNOSIS — H4312 Vitreous hemorrhage, left eye: Secondary | ICD-10-CM | POA: Diagnosis not present

## 2016-06-08 DIAGNOSIS — E113512 Type 2 diabetes mellitus with proliferative diabetic retinopathy with macular edema, left eye: Secondary | ICD-10-CM | POA: Diagnosis not present

## 2016-06-26 ENCOUNTER — Encounter: Payer: Self-pay | Admitting: Internal Medicine

## 2016-06-26 ENCOUNTER — Encounter: Payer: Self-pay | Admitting: *Deleted

## 2016-06-26 ENCOUNTER — Ambulatory Visit (INDEPENDENT_AMBULATORY_CARE_PROVIDER_SITE_OTHER): Payer: Federal, State, Local not specified - PPO | Admitting: Internal Medicine

## 2016-06-26 VITALS — BP 100/74 | HR 79 | Ht 68.0 in | Wt 262.6 lb

## 2016-06-26 DIAGNOSIS — I471 Supraventricular tachycardia: Secondary | ICD-10-CM | POA: Diagnosis not present

## 2016-06-26 DIAGNOSIS — I5022 Chronic systolic (congestive) heart failure: Secondary | ICD-10-CM | POA: Diagnosis not present

## 2016-06-26 DIAGNOSIS — R0683 Snoring: Secondary | ICD-10-CM

## 2016-06-26 MED ORDER — FUROSEMIDE 40 MG PO TABS: ORAL_TABLET | ORAL | 3 refills | Status: DC
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
## 2016-06-26 NOTE — Patient Instructions (Signed)
Medication Instructions:  Your physician has recommended you make the following change in your medication:  Take furosemide 40 mg by mouth as directed.    Labwork: none  Testing/Procedures: Your physician has recommended that you have a sleep study. This test records several body functions during sleep, including: brain activity, eye movement, oxygen and carbon dioxide blood levels, heart rate and rhythm, breathing rate and rhythm, the flow of air through your mouth and nose, snoring, body muscle movements, and chest and belly movement.    Follow-Up: Your physician wants you to follow-up in: 6 months.  You will receive a reminder letter in the mail two months in advance. If you don't receive a letter, please call our office to schedule the follow-up appointment.   Any Other Special Instructions Will Be Listed Below (If Applicable).     If you need a refill on your cardiac medications before your next appointment, please call your pharmacy.

## 2016-06-27 DIAGNOSIS — E7800 Pure hypercholesterolemia, unspecified: Secondary | ICD-10-CM | POA: Diagnosis not present

## 2016-06-27 DIAGNOSIS — E1165 Type 2 diabetes mellitus with hyperglycemia: Secondary | ICD-10-CM | POA: Diagnosis not present

## 2016-06-27 DIAGNOSIS — E1140 Type 2 diabetes mellitus with diabetic neuropathy, unspecified: Secondary | ICD-10-CM | POA: Diagnosis not present

## 2016-06-27 DIAGNOSIS — I10 Essential (primary) hypertension: Secondary | ICD-10-CM | POA: Diagnosis not present

## 2016-06-29 ENCOUNTER — Telehealth: Payer: Self-pay | Admitting: *Deleted

## 2016-06-29 NOTE — Telephone Encounter (Addendum)
Per Dr Marinus Maw split night sleep study ordered. Informed patient of upcoming home sleep study and patient understanding was verbalized. Patient had a previous date for her study for Sunday July 8. Patient asked for a Friday night sleep study appointment. Patient notified of sleep study scheduled for Friday September 07 2016.  Patient understands she will receive a letter in the mail in a week or so. Patient understand to call if her letter does not reach her in a timely manner. Patient agrees with treatment plan and thanked me.

## 2016-07-09 DIAGNOSIS — E1165 Type 2 diabetes mellitus with hyperglycemia: Secondary | ICD-10-CM | POA: Diagnosis not present

## 2016-09-02 ENCOUNTER — Encounter (HOSPITAL_BASED_OUTPATIENT_CLINIC_OR_DEPARTMENT_OTHER): Payer: Federal, State, Local not specified - PPO

## 2016-09-07 ENCOUNTER — Encounter (HOSPITAL_BASED_OUTPATIENT_CLINIC_OR_DEPARTMENT_OTHER): Payer: Federal, State, Local not specified - PPO

## 2016-09-25 IMAGING — MR MR MRA HEAD W/O CM
1 series · 16 of 48 positions shown · non-contrast
Comparison: MRI brain 11/02/2015.

CLINICAL DATA: Patient complains of numbness to the RIGHT arm.
Weakness of the RIGHT arm.

EXAM:
MRA HEAD WITHOUT CONTRAST
TECHNIQUE: Angiographic images of the Circle of Willis were obtained using MRA
technique without intravenous contrast.

[Series 3: MRA · axial · 0.8mm · 0.38mm/px · z∈[-45,+50]mm · 16 of 131 slices shown]
[im 1/131]
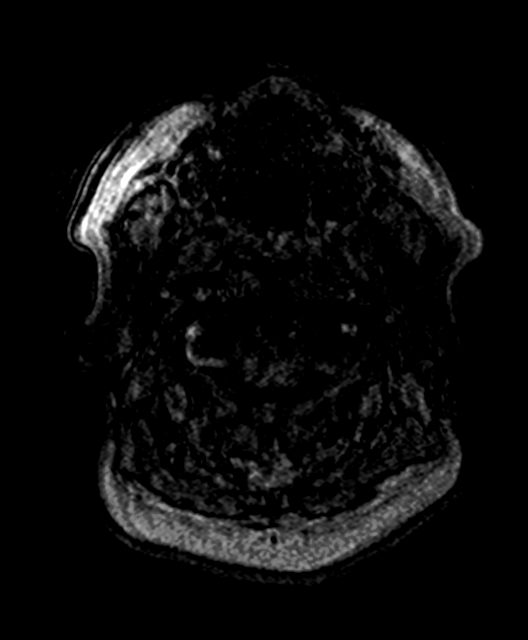
[im 3/131]
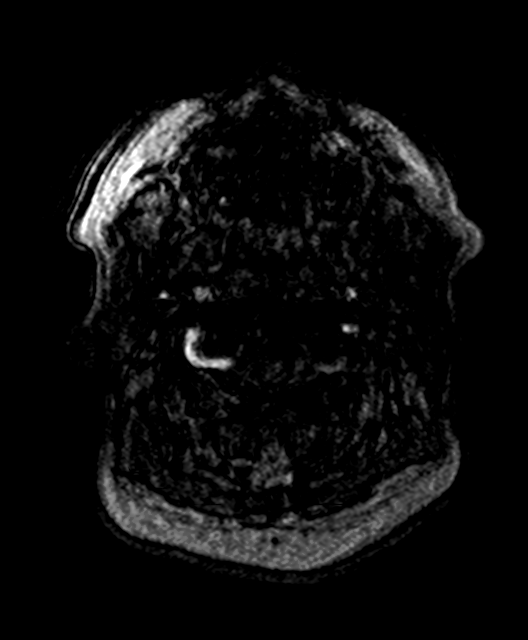
[im 6/131]
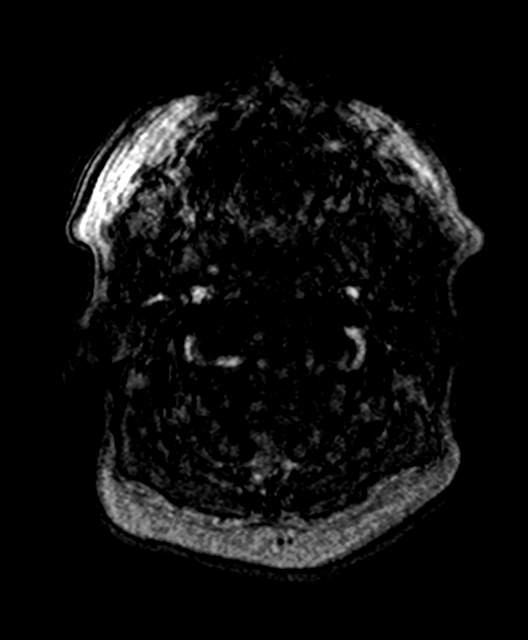
[im 9/131]
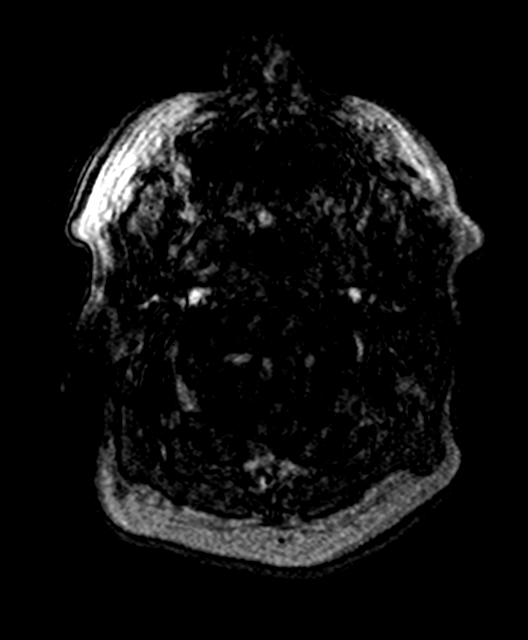
[im 12/131]
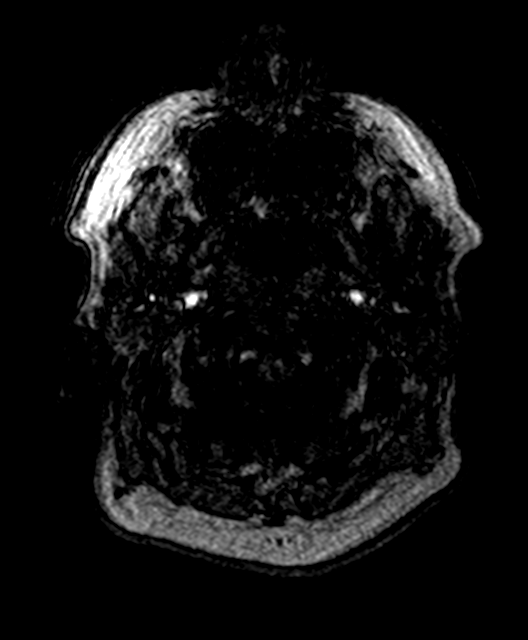
[im 14/131]
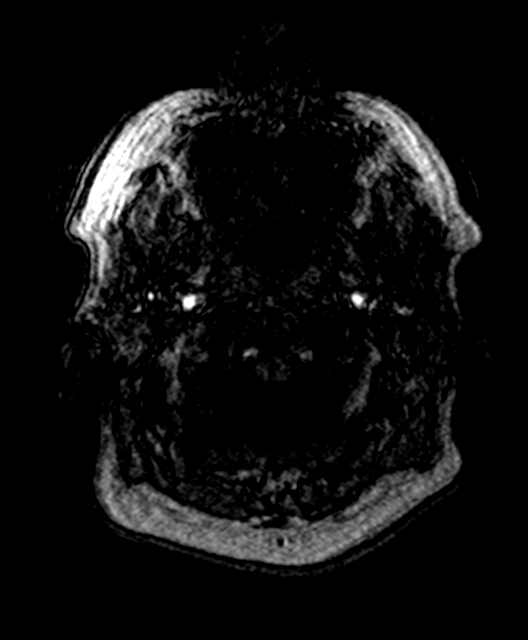
[im 23/131]
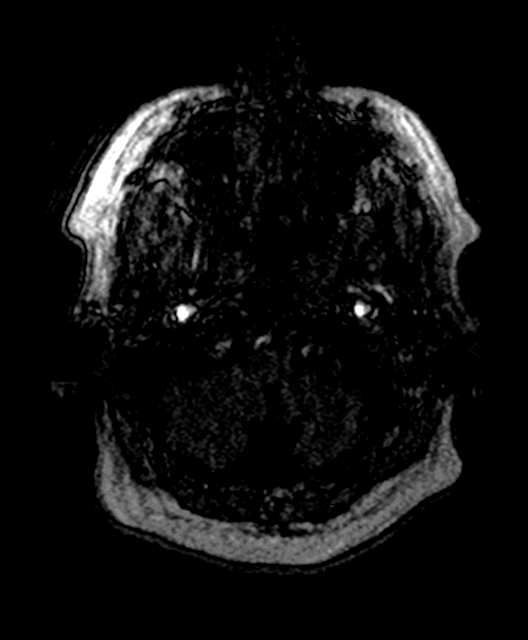
[im 25/131]
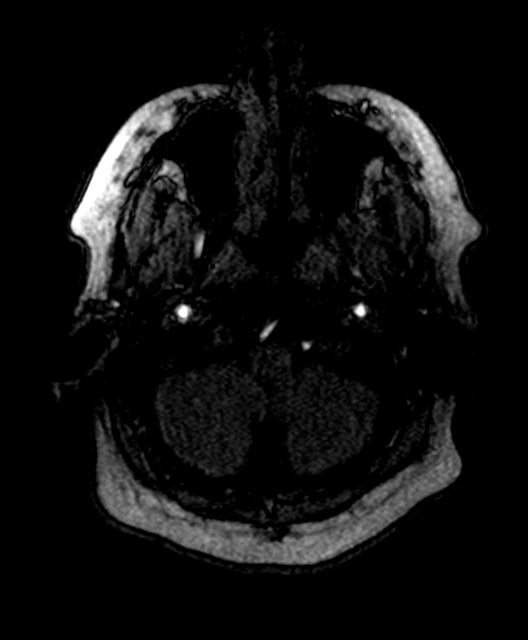
[im 42/131]
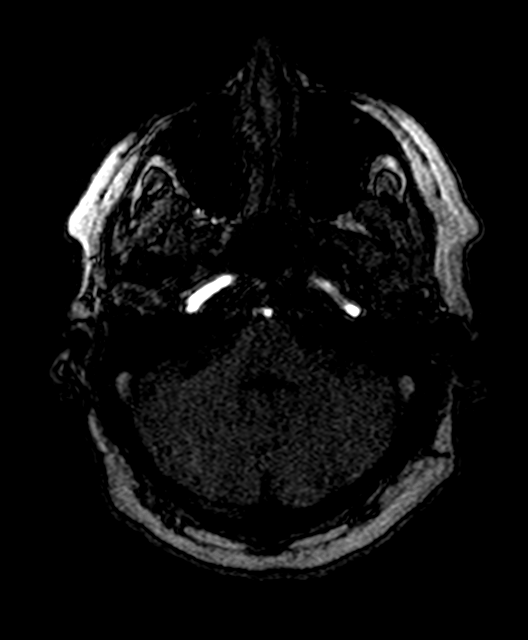
[im 59/131]
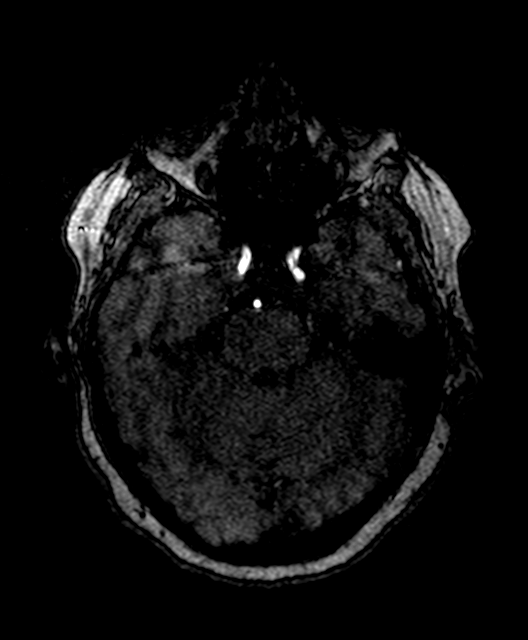
[im 67/131]
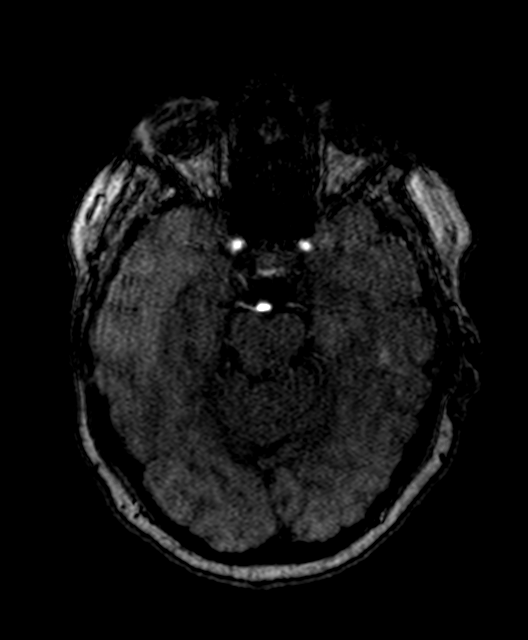
[im 75/131]
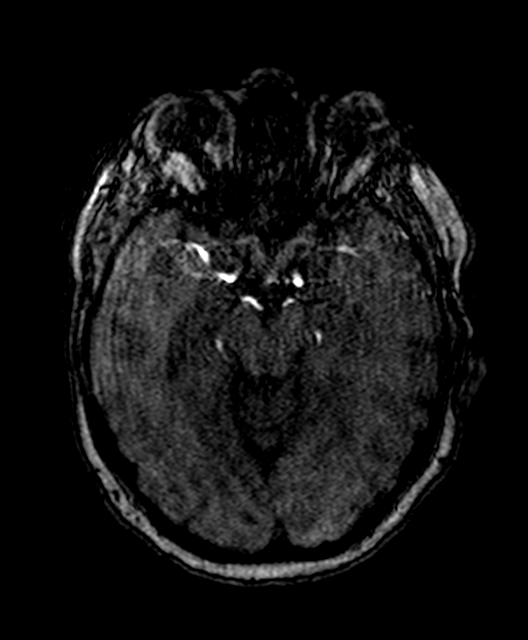
[im 92/131]
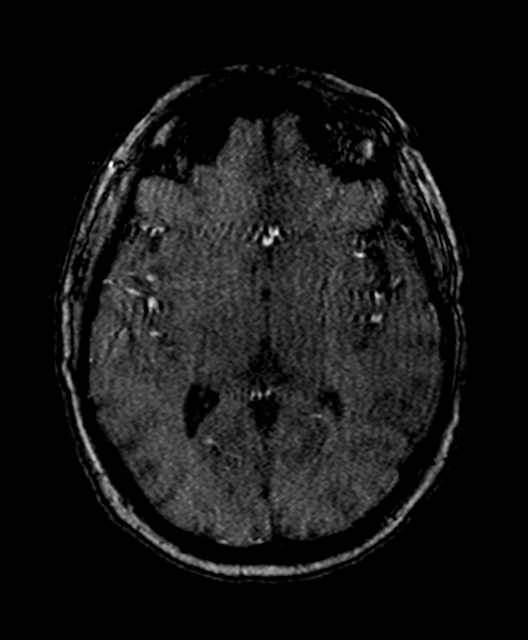
[im 108/131]
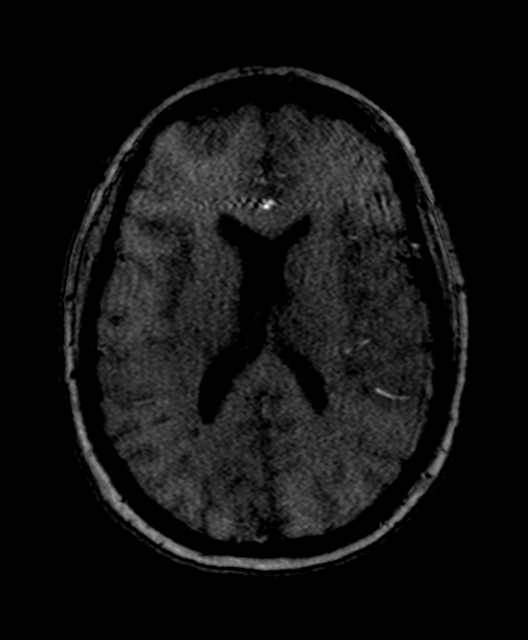
[im 111/131]
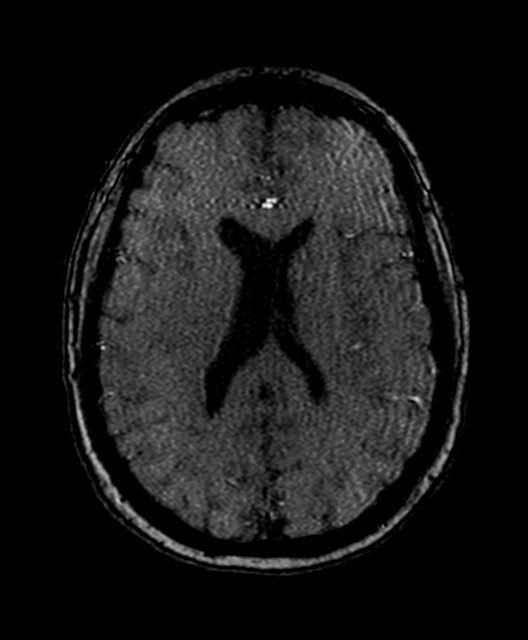
[im 125/131]
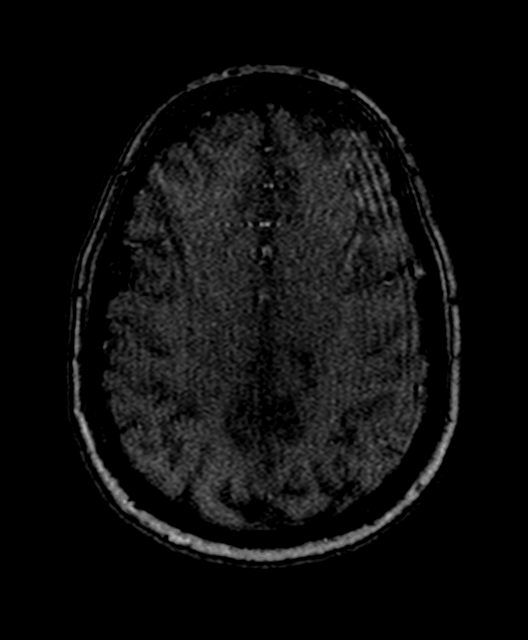

[16 of 48 positions shown; findings below may reference images not displayed]

FINDINGS: There is motion degradation on the images because the patient was
wiping her nose. The study is suboptimal.

The internal carotid arteries are symmetric. There may be minor
atheromatous change in the cavernous carotids, with artifactual
signal loss and horizontal band across the distal cavernous segment.
Both ICA termini are patent.

The RIGHT M1 MCA is widely patent.  Severely diseased RIGHT A1 ACA.

There is a severe stenosis at the origin of the RIGHT M1 MCA,
estimated 90%. Despite patient motion, this does not appear to be
artifactual. See image 27 series 108. Severely diseased LEFT A1 ACA.

BILATERAL irregularity of the M2 and M3 MCA branches representing
intracranial atherosclerotic disease is observed.

Basilar artery is mildly irregular without focal stenosis. Both
vertebrals contribute to basilar formation, RIGHT dominant. Distal
stenosis of the LEFT vertebral estimated 50%.

Moderate irregularity of LEFT greater than RIGHT posterior cerebral
arteries both proximally and distally. Flow reducing stenosis of the
LEFT P1 PCA estimated 75%.

Unremarkable superior cerebellar arteries. AICA branches poorly
seen. Both PICA branches are patent.

No saccular aneurysm.
IMPRESSION: Motion degraded exam.  See discussion above.

Severe stenosis of the proximal LEFT middle cerebral artery could
explain the observed pattern of possible watershed cerebral
infarction.

See discussion above.

## 2016-09-25 IMAGING — MR MR HEAD W/O CM
8 of 10 series · 35 of 48 positions shown · non-contrast
Comparison: CT head 11/02/2015 lightheaded a in the

CLINICAL DATA: Right arm numbness flat and weakness. Speech
difficulty

EXAM:
MRI HEAD WITHOUT CONTRAST
TECHNIQUE: Multiplanar, multiecho pulse sequences of the brain and surrounding
structures were obtained without intravenous contrast.

[Series 2: [id number] · sagittal · 5.0mm · 0.45mm/px · 2 of 20 slices shown]
[im 1/20]
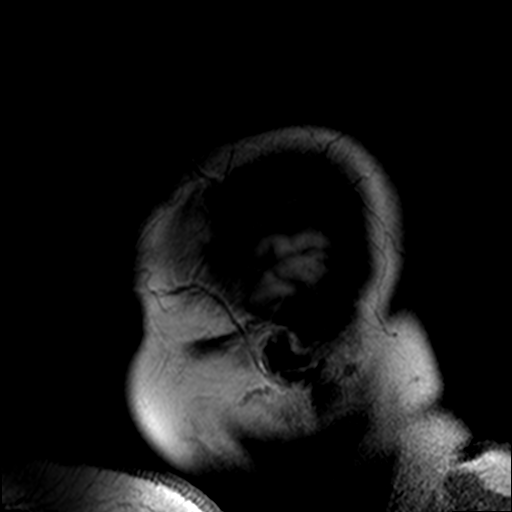
[im 20/20]
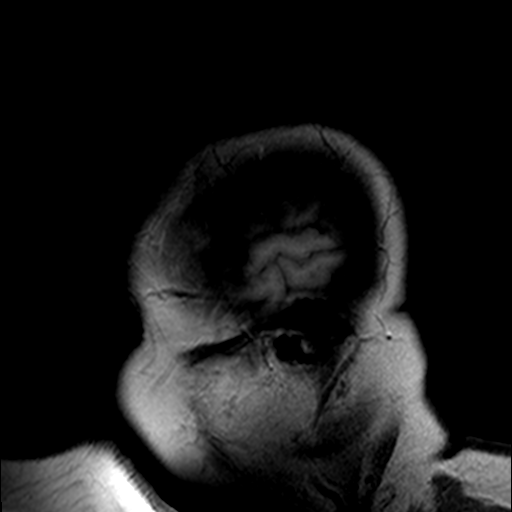

[Series 6: T2 · axial · 5.0mm · 0.75mm/px · z∈[+1,+137]mm · 3 of 23 slices shown (1 of 2)]
[im 1/23]
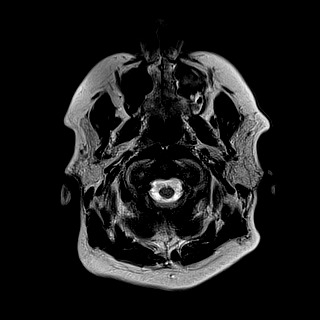
[im 12/23]
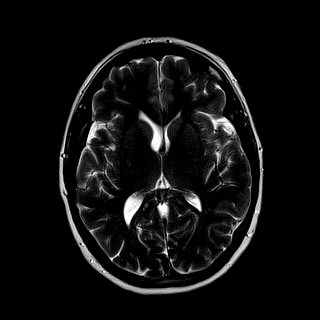
[im 23/23]
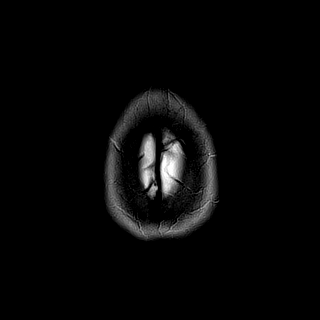

[Series 7: FLAIR · axial · 5.0mm · 0.94mm/px · z∈[+1,+137]mm · 3 of 23 slices shown]
[im 1/23]
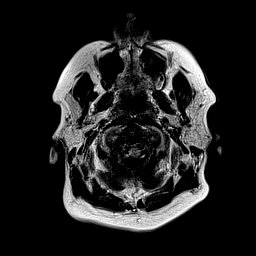
[im 12/23]
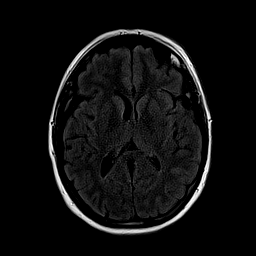
[im 23/23]
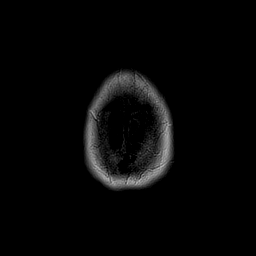

[Series 8: T1 · axial · 2.0mm · 0.47mm/px · z∈[-13,+166]mm · 11 of 95 slices shown]
[im 1/95]
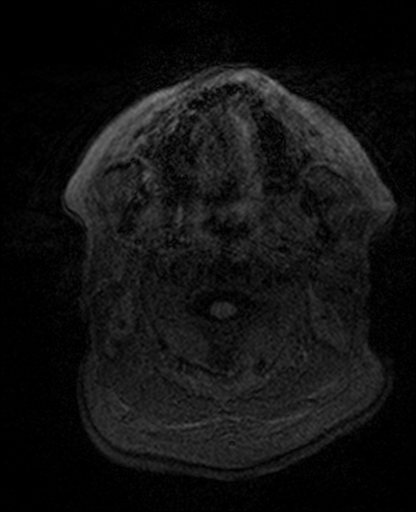
[im 10/95]
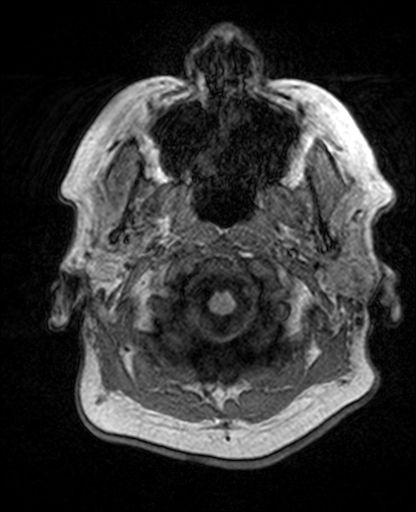
[im 19/95]
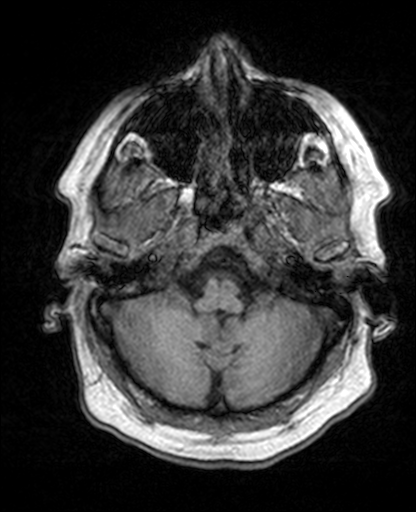
[im 29/95]
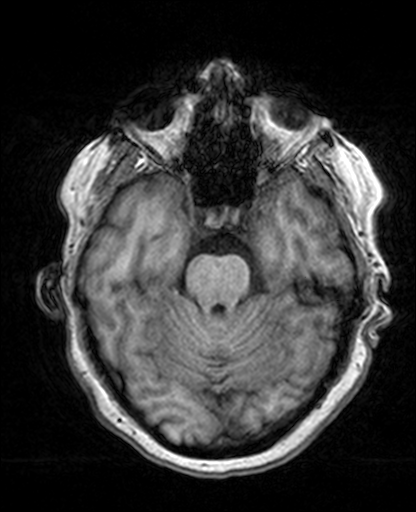
[im 38/95]
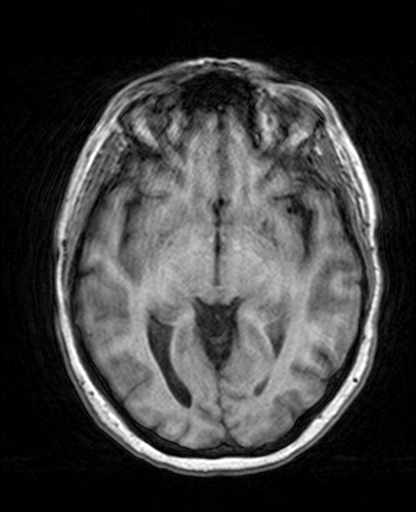
[im 48/95]
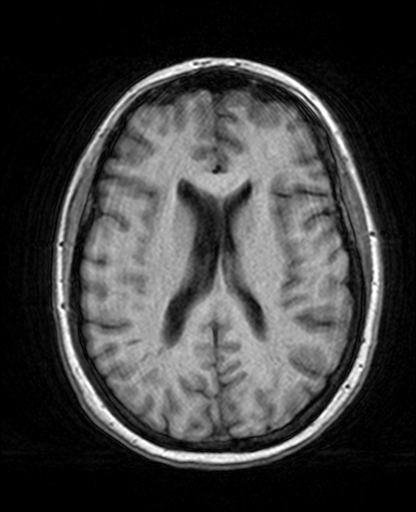
[im 57/95]
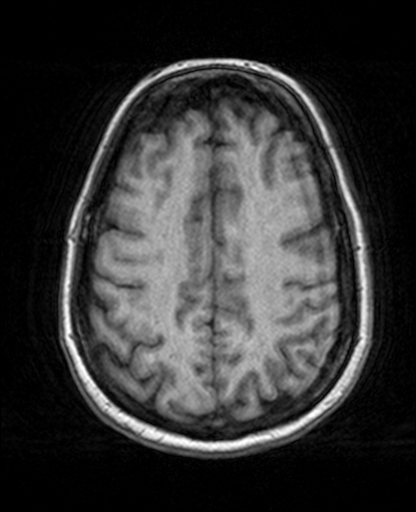
[im 66/95]
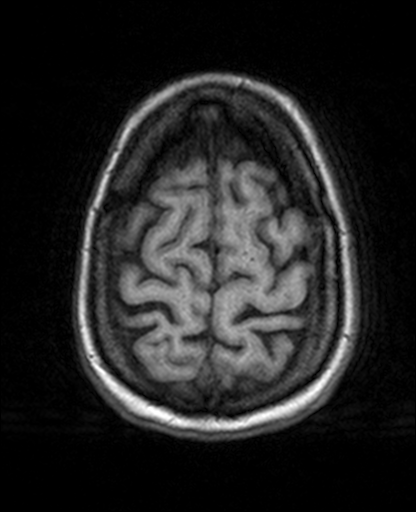
[im 76/95]
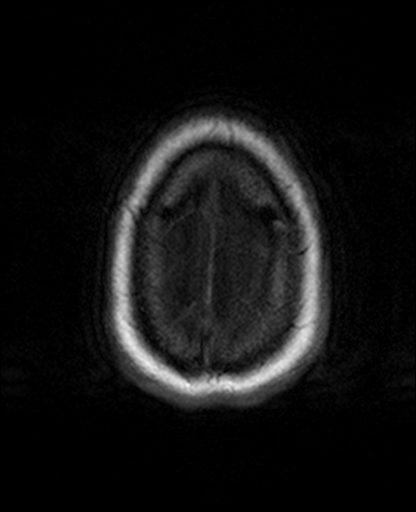
[im 85/95]
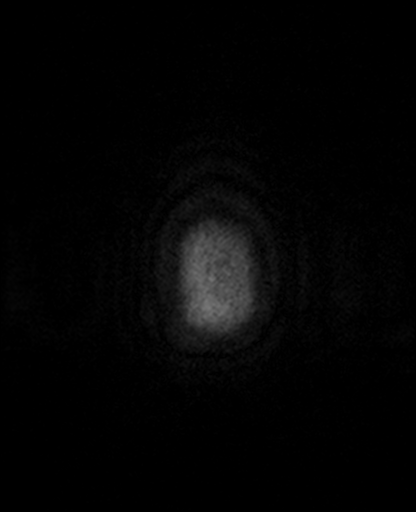
[im 95/95]
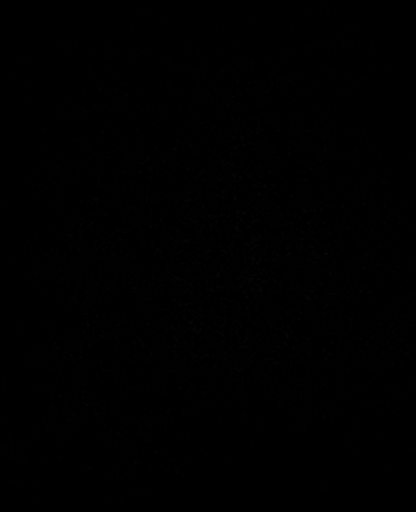

[Series 9: trauma axial · axial · 5.0mm · 0.45mm/px · z∈[+5,+129]mm · 2 of 21 slices shown]
[im 1/21]
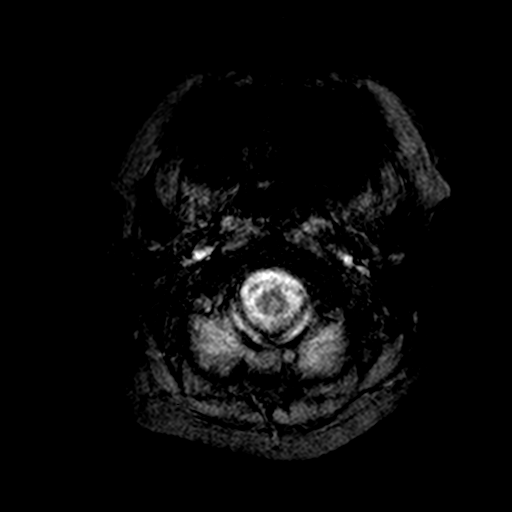
[im 21/21]
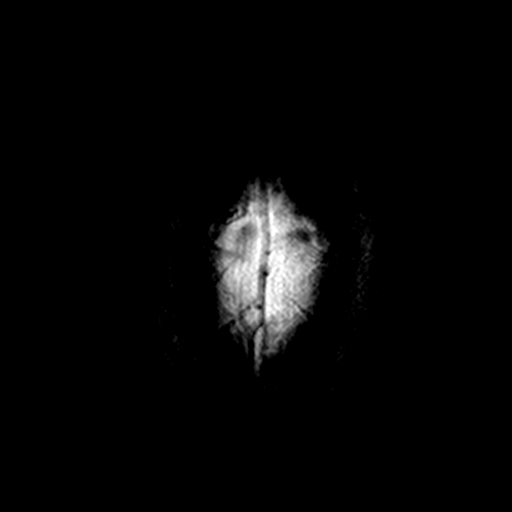

[Series 10: T2 · coronal · 5.0mm · 0.66mm/px · 3 of 26 slices shown (2 of 2)]
[im 1/26]
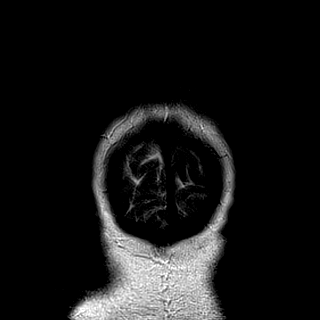
[im 13/26]
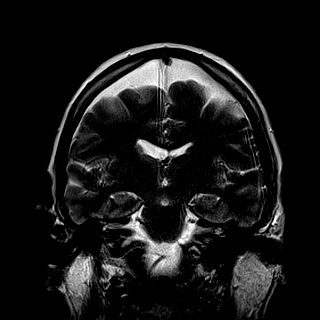
[im 26/26]
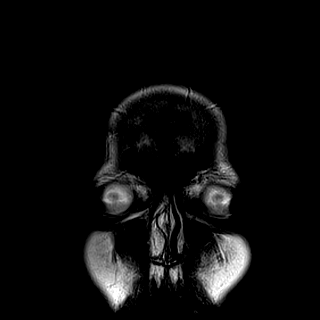

[Series 100: <mpr thick range> · axial · 3.0mm · 0.82mm/px · z∈[+2,+135]mm · 5 of 47 slices shown]
[im 1/47]
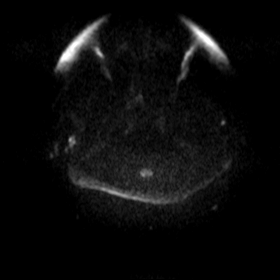
[im 12/47]
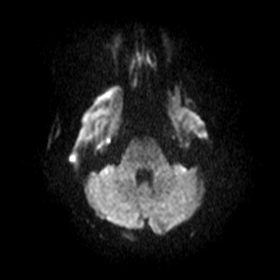
[im 24/47]
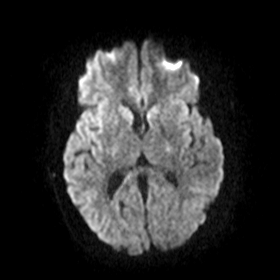
[im 35/47]
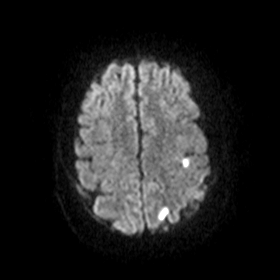
[im 47/47]
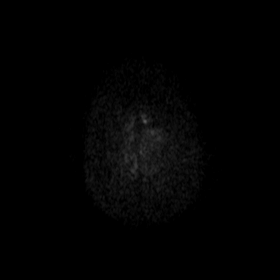

[Series 101: <mpr thick range(1)> · coronal · 3.0mm · 0.82mm/px · 6 of 58 slices shown]
[im 1/58]
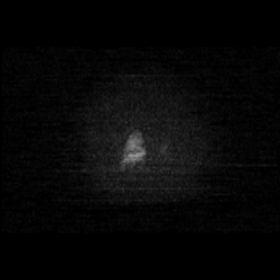
[im 10/58]
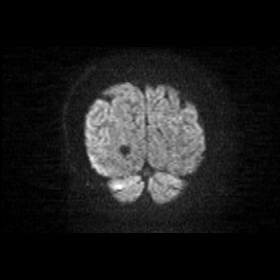
[im 20/58]
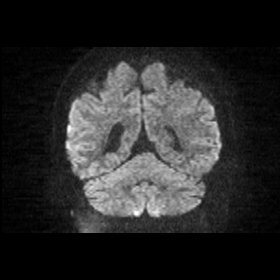
[im 29/58]
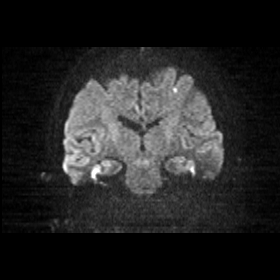
[im 39/58]
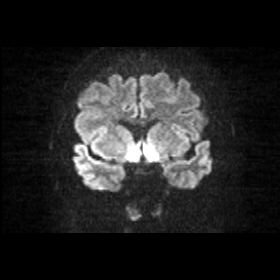
[im 48/58]
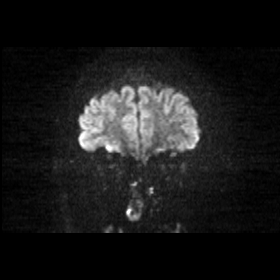

[35 of 48 positions shown; findings below may reference images not displayed]

FINDINGS: Brain: Several scattered small areas of cortical infarction in the
left parietal lobe. No associated hemorrhage. No other areas of
acute infarct. No significant chronic ischemic changes. Otherwise
normal white matter. Negative for mass or edema. No shift of the
midline structures.

Vascular: Normal flow voids.

Skull and upper cervical spine: Negative

Sinuses/Orbits: Negative

Other: Pituitary normal in size
IMPRESSION: Small acute cortical infarctions in the left parietal lobe, possible
emboli. No other area of acute or chronic ischemia.

## 2016-10-01 DIAGNOSIS — K080 Exfoliation of teeth due to systemic causes: Secondary | ICD-10-CM | POA: Diagnosis not present

## 2016-10-04 DIAGNOSIS — H43393 Other vitreous opacities, bilateral: Secondary | ICD-10-CM | POA: Diagnosis not present

## 2016-10-22 ENCOUNTER — Emergency Department (HOSPITAL_COMMUNITY)
Admission: EM | Admit: 2016-10-22 | Discharge: 2016-10-22 | Disposition: A | Discharge status: Home / Self Care | Payer: Federal, State, Local not specified - PPO | Attending: Emergency Medicine | Admitting: Emergency Medicine

## 2016-10-22 DIAGNOSIS — Z5321 Procedure and treatment not carried out due to patient leaving prior to being seen by health care provider: Secondary | ICD-10-CM | POA: Insufficient documentation

## 2016-10-22 DIAGNOSIS — M25539 Pain in unspecified wrist: Secondary | ICD-10-CM | POA: Insufficient documentation

## 2016-10-22 NOTE — ED Notes (Signed)
Pt called,no answer.

## 2016-10-29 ENCOUNTER — Emergency Department (HOSPITAL_COMMUNITY)
Admission: EM | Admit: 2016-10-29 | Discharge: 2016-10-29 | Disposition: A | Discharge status: Home / Self Care | Payer: Federal, State, Local not specified - PPO | Attending: Emergency Medicine | Admitting: Emergency Medicine

## 2016-10-29 ENCOUNTER — Emergency Department (HOSPITAL_COMMUNITY): Payer: Federal, State, Local not specified - PPO

## 2016-10-29 ENCOUNTER — Encounter (HOSPITAL_COMMUNITY): Payer: Self-pay | Admitting: *Deleted

## 2016-10-29 DIAGNOSIS — I110 Hypertensive heart disease with heart failure: Secondary | ICD-10-CM | POA: Insufficient documentation

## 2016-10-29 DIAGNOSIS — X500XXA Overexertion from strenuous movement or load, initial encounter: Secondary | ICD-10-CM | POA: Insufficient documentation

## 2016-10-29 DIAGNOSIS — I5042 Chronic combined systolic (congestive) and diastolic (congestive) heart failure: Secondary | ICD-10-CM | POA: Insufficient documentation

## 2016-10-29 DIAGNOSIS — F4321 Adjustment disorder with depressed mood: Secondary | ICD-10-CM

## 2016-10-29 DIAGNOSIS — M25532 Pain in left wrist: Secondary | ICD-10-CM | POA: Insufficient documentation

## 2016-10-29 DIAGNOSIS — Z79899 Other long term (current) drug therapy: Secondary | ICD-10-CM | POA: Insufficient documentation

## 2016-10-29 DIAGNOSIS — I2510 Atherosclerotic heart disease of native coronary artery without angina pectoris: Secondary | ICD-10-CM | POA: Insufficient documentation

## 2016-10-29 DIAGNOSIS — M19032 Primary osteoarthritis, left wrist: Secondary | ICD-10-CM | POA: Diagnosis not present

## 2016-10-29 DIAGNOSIS — M779 Enthesopathy, unspecified: Secondary | ICD-10-CM | POA: Insufficient documentation

## 2016-10-29 DIAGNOSIS — E1165 Type 2 diabetes mellitus with hyperglycemia: Secondary | ICD-10-CM | POA: Insufficient documentation

## 2016-10-29 DIAGNOSIS — R739 Hyperglycemia, unspecified: Secondary | ICD-10-CM

## 2016-10-29 DIAGNOSIS — F4320 Adjustment disorder, unspecified: Secondary | ICD-10-CM | POA: Insufficient documentation

## 2016-10-29 DIAGNOSIS — Z7901 Long term (current) use of anticoagulants: Secondary | ICD-10-CM | POA: Insufficient documentation

## 2016-10-29 DIAGNOSIS — M19042 Primary osteoarthritis, left hand: Secondary | ICD-10-CM | POA: Diagnosis not present

## 2016-10-29 DIAGNOSIS — Z794 Long term (current) use of insulin: Secondary | ICD-10-CM | POA: Diagnosis not present

## 2016-10-29 HISTORY — DX: Unspecified atrial flutter: I48.92

## 2016-10-29 LAB — CBG MONITORING, ED: Glucose-Capillary: 203 mg/dL — ABNORMAL HIGH (ref 65–99)

## 2016-10-29 NOTE — ED Notes (Signed)
Pt requesting second thumb spice for her work.

## 2016-10-29 NOTE — ED Notes (Signed)
Patient transported to X-ray 

## 2016-10-29 NOTE — ED Notes (Signed)
ED Provider at bedside. 

## 2016-10-29 NOTE — Discharge Instructions (Signed)
Elevate your hand. Use ice and heat for comfort. Wear the splint for comfort. Call Dr Mort Sawyers office or Dr Carola Frost, the orthopedist on call in [place], to have your wrist rechecked. Try to monitor your blood sugars closely. Please follow up with the mental health counselor at work to help you work through your grief for your mother's death.
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
## 2016-10-29 NOTE — ED Notes (Signed)
Pt returned from xray

## 2016-11-01 DIAGNOSIS — E7800 Pure hypercholesterolemia, unspecified: Secondary | ICD-10-CM | POA: Diagnosis not present

## 2016-11-01 DIAGNOSIS — E1140 Type 2 diabetes mellitus with diabetic neuropathy, unspecified: Secondary | ICD-10-CM | POA: Diagnosis not present

## 2016-11-01 DIAGNOSIS — I10 Essential (primary) hypertension: Secondary | ICD-10-CM | POA: Diagnosis not present

## 2016-11-01 DIAGNOSIS — E1165 Type 2 diabetes mellitus with hyperglycemia: Secondary | ICD-10-CM | POA: Diagnosis not present

## 2016-11-30 DIAGNOSIS — M545 Low back pain: Secondary | ICD-10-CM | POA: Diagnosis not present

## 2016-11-30 DIAGNOSIS — Z1389 Encounter for screening for other disorder: Secondary | ICD-10-CM | POA: Diagnosis not present

## 2016-11-30 DIAGNOSIS — Z6841 Body Mass Index (BMI) 40.0 and over, adult: Secondary | ICD-10-CM | POA: Diagnosis not present

## 2016-11-30 DIAGNOSIS — F4320 Adjustment disorder, unspecified: Secondary | ICD-10-CM | POA: Diagnosis not present

## 2016-11-30 DIAGNOSIS — L209 Atopic dermatitis, unspecified: Secondary | ICD-10-CM | POA: Diagnosis not present

## 2017-01-14 ENCOUNTER — Other Ambulatory Visit: Payer: Self-pay

## 2017-01-14 MED ORDER — PRAVASTATIN SODIUM 20 MG PO TABS: 20.0000 mg | ORAL_TABLET | Freq: Every evening | ORAL | 3 refills | Status: DC

## 2017-01-16 DIAGNOSIS — H35371 Puckering of macula, right eye: Secondary | ICD-10-CM | POA: Diagnosis not present

## 2017-01-16 DIAGNOSIS — H2513 Age-related nuclear cataract, bilateral: Secondary | ICD-10-CM | POA: Diagnosis not present

## 2017-01-16 DIAGNOSIS — Z794 Long term (current) use of insulin: Secondary | ICD-10-CM | POA: Diagnosis not present

## 2017-01-16 DIAGNOSIS — H4312 Vitreous hemorrhage, left eye: Secondary | ICD-10-CM | POA: Diagnosis not present

## 2017-01-16 DIAGNOSIS — E113553 Type 2 diabetes mellitus with stable proliferative diabetic retinopathy, bilateral: Secondary | ICD-10-CM | POA: Diagnosis not present

## 2017-01-30 DIAGNOSIS — Z6841 Body Mass Index (BMI) 40.0 and over, adult: Secondary | ICD-10-CM | POA: Diagnosis not present

## 2017-01-30 DIAGNOSIS — J069 Acute upper respiratory infection, unspecified: Secondary | ICD-10-CM | POA: Diagnosis not present

## 2017-01-30 DIAGNOSIS — R300 Dysuria: Secondary | ICD-10-CM | POA: Diagnosis not present

## 2017-01-30 DIAGNOSIS — Z1389 Encounter for screening for other disorder: Secondary | ICD-10-CM | POA: Diagnosis not present

## 2017-01-30 DIAGNOSIS — J0100 Acute maxillary sinusitis, unspecified: Secondary | ICD-10-CM | POA: Diagnosis not present

## 2017-02-11 ENCOUNTER — Other Ambulatory Visit: Payer: Self-pay | Admitting: Cardiology

## 2017-02-12 ENCOUNTER — Other Ambulatory Visit: Payer: Self-pay | Admitting: Internal Medicine

## 2017-02-20 DIAGNOSIS — E113553 Type 2 diabetes mellitus with stable proliferative diabetic retinopathy, bilateral: Secondary | ICD-10-CM | POA: Diagnosis not present

## 2017-02-20 DIAGNOSIS — H4312 Vitreous hemorrhage, left eye: Secondary | ICD-10-CM | POA: Diagnosis not present

## 2017-02-20 DIAGNOSIS — Z794 Long term (current) use of insulin: Secondary | ICD-10-CM | POA: Diagnosis not present

## 2017-02-20 DIAGNOSIS — H2513 Age-related nuclear cataract, bilateral: Secondary | ICD-10-CM | POA: Diagnosis not present

## 2017-03-25 ENCOUNTER — Emergency Department (HOSPITAL_COMMUNITY): Payer: Federal, State, Local not specified - PPO

## 2017-03-25 ENCOUNTER — Other Ambulatory Visit: Payer: Self-pay

## 2017-03-25 ENCOUNTER — Encounter (HOSPITAL_COMMUNITY): Payer: Self-pay | Admitting: Emergency Medicine

## 2017-03-25 ENCOUNTER — Observation Stay (HOSPITAL_COMMUNITY)
Admission: EM | Admit: 2017-03-25 | Discharge: 2017-03-26 | Disposition: A | Discharge status: Home / Self Care | Payer: Federal, State, Local not specified - PPO | Attending: Internal Medicine | Admitting: Internal Medicine

## 2017-03-25 DIAGNOSIS — I110 Hypertensive heart disease with heart failure: Secondary | ICD-10-CM | POA: Insufficient documentation

## 2017-03-25 DIAGNOSIS — R072 Precordial pain: Principal | ICD-10-CM | POA: Insufficient documentation

## 2017-03-25 DIAGNOSIS — I2510 Atherosclerotic heart disease of native coronary artery without angina pectoris: Secondary | ICD-10-CM | POA: Diagnosis present

## 2017-03-25 DIAGNOSIS — Z8673 Personal history of transient ischemic attack (TIA), and cerebral infarction without residual deficits: Secondary | ICD-10-CM | POA: Diagnosis not present

## 2017-03-25 DIAGNOSIS — M542 Cervicalgia: Secondary | ICD-10-CM | POA: Diagnosis not present

## 2017-03-25 DIAGNOSIS — K219 Gastro-esophageal reflux disease without esophagitis: Secondary | ICD-10-CM | POA: Insufficient documentation

## 2017-03-25 DIAGNOSIS — I10 Essential (primary) hypertension: Secondary | ICD-10-CM | POA: Diagnosis present

## 2017-03-25 DIAGNOSIS — Z79899 Other long term (current) drug therapy: Secondary | ICD-10-CM | POA: Diagnosis not present

## 2017-03-25 DIAGNOSIS — I5042 Chronic combined systolic (congestive) and diastolic (congestive) heart failure: Secondary | ICD-10-CM | POA: Diagnosis not present

## 2017-03-25 DIAGNOSIS — Z7901 Long term (current) use of anticoagulants: Secondary | ICD-10-CM | POA: Diagnosis not present

## 2017-03-25 DIAGNOSIS — R079 Chest pain, unspecified: Secondary | ICD-10-CM

## 2017-03-25 DIAGNOSIS — I4892 Unspecified atrial flutter: Secondary | ICD-10-CM | POA: Insufficient documentation

## 2017-03-25 DIAGNOSIS — Z794 Long term (current) use of insulin: Secondary | ICD-10-CM | POA: Diagnosis not present

## 2017-03-25 DIAGNOSIS — E119 Type 2 diabetes mellitus without complications: Secondary | ICD-10-CM

## 2017-03-25 DIAGNOSIS — R0602 Shortness of breath: Secondary | ICD-10-CM | POA: Diagnosis not present

## 2017-03-25 DIAGNOSIS — M549 Dorsalgia, unspecified: Secondary | ICD-10-CM | POA: Diagnosis not present

## 2017-03-25 LAB — BASIC METABOLIC PANEL
Anion gap: 11 (ref 5–15)
BUN: 15 mg/dL (ref 6–20)
CHLORIDE: 101 mmol/L (ref 101–111)
CO2: 26 mmol/L (ref 22–32)
CREATININE: 0.73 mg/dL (ref 0.44–1.00)
Calcium: 9.6 mg/dL (ref 8.9–10.3)
GFR calc Af Amer: >60 mL/min (ref >60)
GFR calc non Af Amer: >60 mL/min (ref >60)
Glucose, Bld: 99 mg/dL (ref 65–99)
Potassium: 4 mmol/L (ref 3.5–5.1)
Sodium: 138 mmol/L (ref 135–145)

## 2017-03-25 LAB — CBC
HCT: 44.7 % (ref 36.0–46.0)
Hemoglobin: 14.4 g/dL (ref 12.0–15.0)
MCH: 28.9 pg (ref 26.0–34.0)
MCHC: 32.2 g/dL (ref 30.0–36.0)
MCV: 89.6 fL (ref 78.0–100.0)
PLATELETS: 258 10*3/uL (ref 150–400)
RBC: 4.99 MIL/uL (ref 3.87–5.11)
RDW: 12.9 % (ref 11.5–15.5)
WBC: 11.2 10*3/uL — ABNORMAL HIGH (ref 4.0–10.5)

## 2017-03-25 LAB — GLUCOSE, CAPILLARY
GLUCOSE-CAPILLARY: 112 mg/dL — AB (ref 65–99)
Glucose-Capillary: 77 mg/dL (ref 65–99)

## 2017-03-25 LAB — I-STAT TROPONIN, ED: Troponin i, poc: 0.01 ng/mL (ref 0.00–0.08)

## 2017-03-25 LAB — CBG MONITORING, ED: GLUCOSE-CAPILLARY: 96 mg/dL (ref 65–99)

## 2017-03-25 MED ORDER — LORATADINE 10 MG PO TABS
10.0000 mg | ORAL_TABLET | Freq: Every day | ORAL | Status: DC
  Administered 2017-03-26: 10 mg via ORAL
  Filled 2017-03-25: qty 1

## 2017-03-25 MED ORDER — DOCUSATE SODIUM 100 MG PO CAPS
100.0000 mg | ORAL_CAPSULE | Freq: Every day | ORAL | Status: DC
  Administered 2017-03-26: 100 mg via ORAL
  Filled 2017-03-25: qty 1

## 2017-03-25 MED ORDER — SODIUM CHLORIDE 0.9% FLUSH
3.0000 mL | Freq: Two times a day (BID) | INTRAVENOUS | Status: DC
  Administered 2017-03-25 – 2017-03-26 (×2): 3 mL via INTRAVENOUS

## 2017-03-25 MED ORDER — PANTOPRAZOLE SODIUM 40 MG PO TBEC
40.0000 mg | DELAYED_RELEASE_TABLET | Freq: Every day | ORAL | Status: DC
  Administered 2017-03-25 – 2017-03-26 (×2): 40 mg via ORAL
  Filled 2017-03-25 (×2): qty 1

## 2017-03-25 MED ORDER — VITAMIN D 1000 UNITS PO TABS
1000.0000 [IU] | ORAL_TABLET | Freq: Every day | ORAL | Status: DC
  Administered 2017-03-26: 1000 [IU] via ORAL
  Filled 2017-03-25: qty 1

## 2017-03-25 MED ORDER — MONTELUKAST SODIUM 10 MG PO TABS
10.0000 mg | ORAL_TABLET | Freq: Every day | ORAL | Status: DC
  Administered 2017-03-26: 10 mg via ORAL
  Filled 2017-03-25: qty 1

## 2017-03-25 MED ORDER — FERROUS SULFATE 325 (65 FE) MG PO TABS
325.0000 mg | ORAL_TABLET | Freq: Every day | ORAL | Status: DC
  Administered 2017-03-26: 325 mg via ORAL
  Filled 2017-03-25: qty 1

## 2017-03-25 MED ORDER — QUINAPRIL HCL 10 MG PO TABS
20.0000 mg | ORAL_TABLET | Freq: Every day | ORAL | Status: DC
  Filled 2017-03-25 (×2): qty 2

## 2017-03-25 MED ORDER — INSULIN ASPART 100 UNIT/ML ~~LOC~~ SOLN
0.0000 [IU] | SUBCUTANEOUS | Status: DC
  Administered 2017-03-26: 1 [IU] via SUBCUTANEOUS

## 2017-03-25 MED ORDER — SODIUM CHLORIDE 0.9 % IV SOLN: 250.0000 mL | INTRAVENOUS | Status: DC | PRN

## 2017-03-25 MED ORDER — ASPIRIN 81 MG PO CHEW
81.0000 mg | CHEWABLE_TABLET | Freq: Once | ORAL | Status: AC
  Administered 2017-03-25: 81 mg via ORAL
  Filled 2017-03-25: qty 1

## 2017-03-25 MED ORDER — CARVEDILOL 12.5 MG PO TABS
25.0000 mg | ORAL_TABLET | Freq: Two times a day (BID) | ORAL | Status: DC
  Administered 2017-03-25 – 2017-03-26 (×2): 25 mg via ORAL
  Filled 2017-03-25 (×2): qty 2

## 2017-03-25 MED ORDER — PRAVASTATIN SODIUM 10 MG PO TABS
20.0000 mg | ORAL_TABLET | Freq: Every evening | ORAL | Status: DC
  Administered 2017-03-25: 20 mg via ORAL
  Filled 2017-03-25: qty 2

## 2017-03-25 MED ORDER — ALPRAZOLAM 0.25 MG PO TABS: 0.2500 mg | ORAL_TABLET | Freq: Every evening | ORAL | Status: DC | PRN

## 2017-03-25 MED ORDER — NITROGLYCERIN 0.4 MG SL SUBL
0.4000 mg | SUBLINGUAL_TABLET | SUBLINGUAL | Status: DC | PRN
  Administered 2017-03-25 (×2): 0.4 mg via SUBLINGUAL
  Filled 2017-03-25: qty 1

## 2017-03-25 MED ORDER — GABAPENTIN 100 MG PO CAPS
200.0000 mg | ORAL_CAPSULE | Freq: Two times a day (BID) | ORAL | Status: DC
  Administered 2017-03-25 – 2017-03-26 (×2): 200 mg via ORAL
  Filled 2017-03-25 (×2): qty 2

## 2017-03-25 MED ORDER — SODIUM CHLORIDE 0.9% FLUSH: 3.0000 mL | INTRAVENOUS | Status: DC | PRN

## 2017-03-25 MED ORDER — DULOXETINE HCL 30 MG PO CPEP
30.0000 mg | ORAL_CAPSULE | Freq: Every day | ORAL | Status: DC
  Administered 2017-03-26: 30 mg via ORAL
  Filled 2017-03-25: qty 1

## 2017-03-25 MED ORDER — TRAMADOL HCL 50 MG PO TABS
50.0000 mg | ORAL_TABLET | Freq: Two times a day (BID) | ORAL | Status: DC | PRN
  Administered 2017-03-26: 50 mg via ORAL
  Filled 2017-03-25: qty 1

## 2017-03-25 MED ORDER — ASPIRIN 81 MG PO CHEW: 324.0000 mg | CHEWABLE_TABLET | Freq: Once | ORAL | Status: DC

## 2017-03-25 MED ORDER — APIXABAN 5 MG PO TABS
5.0000 mg | ORAL_TABLET | Freq: Two times a day (BID) | ORAL | Status: DC
  Administered 2017-03-25 – 2017-03-26 (×2): 5 mg via ORAL
  Filled 2017-03-25 (×6): qty 1

## 2017-03-25 MED ORDER — VITAMIN B-12 1000 MCG PO TABS
1000.0000 ug | ORAL_TABLET | Freq: Every day | ORAL | Status: DC
  Administered 2017-03-26: 1000 ug via ORAL
  Filled 2017-03-25: qty 1

## 2017-03-25 MED ORDER — FUROSEMIDE 40 MG PO TABS
40.0000 mg | ORAL_TABLET | Freq: Every day | ORAL | Status: DC
  Administered 2017-03-26: 40 mg via ORAL
  Filled 2017-03-25: qty 1
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
## 2017-03-25 NOTE — ED Triage Notes (Signed)
Patient reports chest pains that started over the weekend. Patient states she is "a little short of breath and tight through her neck and back." Patient states she started having pain approx 1400 today. Took 2 81 mg ASA. Patient has history of heart failure and atrial flutter.

## 2017-03-26 ENCOUNTER — Encounter (HOSPITAL_COMMUNITY): Payer: Self-pay | Admitting: Student

## 2017-03-26 ENCOUNTER — Observation Stay (HOSPITAL_BASED_OUTPATIENT_CLINIC_OR_DEPARTMENT_OTHER): Payer: Federal, State, Local not specified - PPO

## 2017-03-26 ENCOUNTER — Other Ambulatory Visit: Payer: Self-pay | Admitting: Student

## 2017-03-26 ENCOUNTER — Other Ambulatory Visit: Payer: Self-pay

## 2017-03-26 DIAGNOSIS — R079 Chest pain, unspecified: Secondary | ICD-10-CM

## 2017-03-26 DIAGNOSIS — Z8679 Personal history of other diseases of the circulatory system: Secondary | ICD-10-CM

## 2017-03-26 DIAGNOSIS — R0789 Other chest pain: Secondary | ICD-10-CM

## 2017-03-26 LAB — COMPREHENSIVE METABOLIC PANEL
ALT: 17 U/L (ref 14–54)
ANION GAP: 9 (ref 5–15)
AST: 21 U/L (ref 15–41)
Albumin: 3.7 g/dL (ref 3.5–5.0)
Alkaline Phosphatase: 59 U/L (ref 38–126)
BUN: 17 mg/dL (ref 6–20)
CHLORIDE: 102 mmol/L (ref 101–111)
CO2: 27 mmol/L (ref 22–32)
CREATININE: 0.66 mg/dL (ref 0.44–1.00)
Calcium: 9.3 mg/dL (ref 8.9–10.3)
GFR calc non Af Amer: >60 mL/min (ref >60)
Glucose, Bld: 129 mg/dL — ABNORMAL HIGH (ref 65–99)
POTASSIUM: 3.6 mmol/L (ref 3.5–5.1)
SODIUM: 138 mmol/L (ref 135–145)
Total Bilirubin: 0.8 mg/dL (ref 0.3–1.2)
Total Protein: 6.7 g/dL (ref 6.5–8.1)

## 2017-03-26 LAB — GLUCOSE, CAPILLARY
Glucose-Capillary: 132 mg/dL — ABNORMAL HIGH (ref 65–99)
Glucose-Capillary: 101 mg/dL — ABNORMAL HIGH (ref 65–99)

## 2017-03-26 LAB — TROPONIN I
Troponin I: <0.03 ng/mL (ref <0.03)
Troponin I: <0.03 ng/mL (ref <0.03)

## 2017-03-26 LAB — CBC
HEMATOCRIT: 43.2 % (ref 36.0–46.0)
HEMOGLOBIN: 13.9 g/dL (ref 12.0–15.0)
MCH: 29 pg (ref 26.0–34.0)
MCHC: 32.2 g/dL (ref 30.0–36.0)
MCV: 90.2 fL (ref 78.0–100.0)
PLATELETS: 218 10*3/uL (ref 150–400)
RBC: 4.79 MIL/uL (ref 3.87–5.11)
RDW: 12.8 % (ref 11.5–15.5)
WBC: 9.7 10*3/uL (ref 4.0–10.5)

## 2017-03-26 LAB — ECHOCARDIOGRAM COMPLETE
HEIGHTINCHES: 68 in
WEIGHTICAEL: 4369.6 [oz_av]

## 2017-03-26 MED ORDER — LISINOPRIL 10 MG PO TABS
20.0000 mg | ORAL_TABLET | Freq: Every day | ORAL | Status: DC
  Administered 2017-03-26: 20 mg via ORAL
  Filled 2017-03-26: qty 2

## 2017-03-26 NOTE — Progress Notes (Signed)
Patient being d/c home with instructions. IV cath removed and intact. No c/o pain at this time or at site. Patient verbalizes understanding. 
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
## 2017-03-26 NOTE — Discharge Summary (Signed)
Physician Discharge Summary  Vanessa Silva:[id number] DOB: 08/28/1960 DOA: 03/25/2017  PCP: Avis Epley, PA-C  Admit date: 03/25/2017 Discharge date: 03/26/2017  Time spent: 45 minutes  Recommendations for Outpatient Follow-up:  -Will be discharged home today. -Seen by cardiology who will arrange outpatient Myoview  Discharge Diagnoses:  Active Problems:   HTN (hypertension)   CAD (coronary artery disease)   Diabetes mellitus without complication (HCC)   Chest pain   Discharge Condition: Stable and improved  Filed Weights   03/25/17 1733 03/26/17 0500  Weight: 120.2 kg (265 lb) 123.9 kg (273 lb 1.6 oz)    History of present illness:  As per Dr. Selena Batten on 1/28: Vanessa Silva  is a 57 y.o. female,  w Dm2, Hypertension,  CAD (nonobstructive), CHF, Afluttter s/p    C/o chest pain left sided over the weekend, and then starting 2pm today after lunch, and took 2 baby aspirin and called cardiology who told her to come to ER. Pt states chest pain "sharp" and radiating to neck and left upper chest.  Lasted about 3 hours, slight dyspnea.  Took nitro in ED, with some relief.  Pt denies fever, chills, cough,  Heartburn, abd pain, diarrhea, brbpr.  Pt presented to ED for evaluation of chest pain     In Ed.  Na 138, K 4.0 Bun 15, Creatinine 0.73 Wbc 11.2, hgb 14.4, Plt 258 Trop 0.01   CXR IMPRESSION: No active cardiopulmonary disease.  EKG nsr at 70, nl axis, no st-t changes c/w ischemia  Pt will be admitted for chest pain r/o     Hospital Course:   Chest pain -Has ruled out for ACS with negative troponins. -Seen by cardiology who plans to arrange an outpatient stress Myoview. -They are okay with discharge home today.  Rest of chronic conditions have been stable this hospitalization.  Procedures:  None  Consultations:  Cardiology  Discharge Instructions  Discharge Instructions    Diet - low sodium heart healthy   Complete by:  As directed    Increase activity slowly   Complete by:  As directed      Allergies as of 03/26/2017      Reactions   Chlorhexidine Gluconate Hives   Skin rash after CHG wipes   Demerol [meperidine] Nausea And Vomiting   Morphine And Related Nausea And Vomiting   Promethazine Other (See Comments)   No reaction listed      Medication List    TAKE these medications   ALPRAZolam 0.5 MG tablet Commonly known as:  XANAX Take 0.25-0.5 mg by mouth at bedtime as needed for anxiety or sleep.   apixaban 5 MG Tabs tablet Commonly known as:  ELIQUIS TAKE 1 TABLET(5 MG) BY MOUTH TWICE DAILY   aspirin EC 81 MG tablet Take 81 mg by mouth daily.   carvedilol 25 MG tablet Commonly known as:  COREG TAKE 1 TABLET BY MOUTH TWICE DAILY WITH A MEAL   docusate sodium 100 MG capsule Commonly known as:  COLACE Take 100 mg by mouth daily.   DULoxetine 30 MG capsule Commonly known as:  CYMBALTA TAKE ONE CAPSULE ONCE DAILY AT BEDTIME   ferrous sulfate 325 (65 FE) MG EC tablet Take 325 mg by mouth daily with breakfast.   furosemide 40 MG tablet Commonly known as:  LASIX Take one tablet by mouth as directed. What changed:    how much to take  how to take this  when to take this  additional instructions  gabapentin 300 MG capsule Commonly known as:  NEURONTIN Take 300 mg by mouth 3 (three) times daily.   insulin aspart 100 UNIT/ML injection Commonly known as:  novoLOG Inject 0-9 Units into the skin 3 (three) times daily with meals. What changed:    when to take this  additional instructions   INVOKANA 300 MG Tabs tablet Generic drug:  canagliflozin Take 300 mg by mouth daily.   JANUMET XR [phone] MG Tb24 Generic drug:  SitaGLIPtin-MetFORMIN HCl Take 1 tablet by mouth daily.   loratadine 10 MG tablet Commonly known as:  CLARITIN Take 10 mg by mouth daily.   montelukast 10 MG tablet Commonly known as:  SINGULAIR Take 10 mg by mouth daily.   nitroGLYCERIN 0.4 MG SL  tablet Commonly known as:  NITROSTAT Place 1 tablet (0.4 mg total) under the tongue every 5 (five) minutes as needed for chest pain.   omeprazole 20 MG capsule Commonly known as:  PRILOSEC Take 20 mg by mouth 2 (two) times daily before a meal.   PARoxetine 40 MG tablet Commonly known as:  PAXIL Take 40 mg by mouth at bedtime.   pravastatin 20 MG tablet Commonly known as:  PRAVACHOL Take 1 tablet (20 mg total) every evening by mouth.   quinapril 20 MG tablet Commonly known as:  ACCUPRIL Take 20 mg by mouth daily.   traMADol 50 MG tablet Commonly known as:  ULTRAM Take 1 tablet (50 mg total) by mouth every 6 (six) hours as needed for moderate pain. What changed:  when to take this   TRESIBA FLEXTOUCH 200 UNIT/ML Sopn Generic drug:  Insulin Degludec INJECT 72 UNITS SUBCUTANEOUSLY EVERY DAY AT BEDTIME   VICTOZA 18 MG/3ML Sopn Generic drug:  liraglutide Inject 1.8 mg into the skin every morning.   VITAMIN B-12 PO Take 2,500 mcg by mouth every morning.   VITAMIN D3 PO Take 1 capsule by mouth at bedtime.      Allergies  Allergen Reactions  . Chlorhexidine Gluconate Hives    Skin rash after CHG wipes  . Demerol [Meperidine] Nausea And Vomiting  . Morphine And Related Nausea And Vomiting  . Promethazine Other (See Comments)    No reaction listed   Follow-up Information    Marinus Maw, MD.   Specialty:  Cardiology Why:  The office will contact you to arrange for your stress test. If you do not hear from them within 2-3 business days, please call the number provided.  Contact information: [address] [place] [address] [phone]            The results of significant diagnostics from this hospitalization (including imaging, microbiology, ancillary and laboratory) are listed below for reference.    Significant Diagnostic Studies: Dg Chest 2 View  Result Date: 03/25/2017 CLINICAL DATA:  Acute left chest pain and shortness of breath for several days.  EXAM: CHEST  2 VIEW COMPARISON:  05/31/2013 radiographs FINDINGS: The cardiomediastinal silhouette is unremarkable. There is no evidence of focal airspace disease, pulmonary edema, suspicious pulmonary nodule/mass, pleural effusion, or pneumothorax. No acute bony abnormalities are identified. IMPRESSION: No active cardiopulmonary disease. Electronically Signed   By: Harmon Pier M.D.   On: 03/25/2017 18:41    Microbiology: No results found for this or any previous visit (from the past 240 hour(s)).   Labs: Basic Metabolic Panel: Recent Labs  Lab 03/25/17 1738 03/26/17 0307  NA 138 138  K 4.0 3.6  CL 101 102  CO2 26 27  GLUCOSE 99 129*  BUN 15 17  CREATININE 0.73 0.66  CALCIUM 9.6 9.3   Liver Function Tests: Recent Labs  Lab 03/26/17 0307  AST 21  ALT 17  ALKPHOS 59  BILITOT 0.8  PROT 6.7  ALBUMIN 3.7   No results for input(s): LIPASE, AMYLASE in the last 168 hours. No results for input(s): AMMONIA in the last 168 hours. CBC: Recent Labs  Lab 03/25/17 1738 03/26/17 0307  WBC 11.2* 9.7  HGB 14.4 13.9  HCT 44.7 43.2  MCV 89.6 90.2  PLT 258 218   Cardiac Enzymes: Recent Labs  Lab 03/26/17 0307 03/26/17 0919  TROPONINI <0.03 <0.03   BNP: BNP (last 3 results) No results for input(s): BNP in the last 8760 hours.  ProBNP (last 3 results) No results for input(s): PROBNP in the last 8760 hours.  CBG: Recent Labs  Lab 03/25/17 1954 03/25/17 2122 03/25/17 2233 03/26/17 0417 03/26/17 0839  GLUCAP 96 77 112* 132* 101*       Signed:  Chaya Jan  [facility] Pager: [phone] 03/26/2017, 3:39 PM

## 2017-03-26 NOTE — Progress Notes (Signed)
*  PRELIMINARY RESULTS* Echocardiogram 2D Echocardiogram has been performed.  Vanessa Silva 03/26/2017, 10:10 AM

## 2017-03-27 LAB — HIV ANTIBODY (ROUTINE TESTING W REFLEX): HIV Screen 4th Generation wRfx: NONREACTIVE

## 2017-03-28 ENCOUNTER — Telehealth: Payer: Self-pay | Admitting: Student

## 2017-03-28 DIAGNOSIS — H2513 Age-related nuclear cataract, bilateral: Secondary | ICD-10-CM | POA: Diagnosis not present

## 2017-03-28 DIAGNOSIS — E113553 Type 2 diabetes mellitus with stable proliferative diabetic retinopathy, bilateral: Secondary | ICD-10-CM | POA: Diagnosis not present

## 2017-03-28 DIAGNOSIS — H4312 Vitreous hemorrhage, left eye: Secondary | ICD-10-CM | POA: Diagnosis not present

## 2017-03-28 NOTE — Telephone Encounter (Signed)
LMOM for patient to call office to schedule / tg

## 2017-03-28 NOTE — Telephone Encounter (Signed)
-----   Message from Ellsworth Lennox, [place] sent at 03/26/2017 11:20 AM EST ----- Regarding: Outpatient Stress Test This patient is getting discharged from the hospital today and will need to have an outpatient Lexiscan Myoview. Will likely be a 2-day study as weight is 273 lbs. I already entered the order. If you could contact her within the next 2-3 days to schedule her appointment, that would be great.   Thank you!  - [place]

## 2017-04-08 ENCOUNTER — Other Ambulatory Visit: Payer: Self-pay | Admitting: Cardiology

## 2017-04-16 ENCOUNTER — Telehealth: Payer: Self-pay | Admitting: Student

## 2017-04-16 NOTE — Telephone Encounter (Signed)
Please give pt a call --scheduled for 2 day lexi next week, she needs to know what meds to hold and she also has a problem w/ her eyes.

## 2017-04-16 NOTE — Telephone Encounter (Signed)
Patient told to hold lasix and insulin until she is home and after she has eaten.She knows to be NPO 6 hrs before test. She understands this is a 2 day test.She is arrive at 830 am at radiology on day of test

## 2017-04-22 ENCOUNTER — Other Ambulatory Visit: Payer: Self-pay

## 2017-04-22 MED ORDER — FUROSEMIDE 40 MG PO TABS: 40.0000 mg | ORAL_TABLET | Freq: Every day | ORAL | 3 refills | Status: DC

## 2017-04-22 NOTE — Telephone Encounter (Signed)
Refilled lasix 40 mg qd to [facility] [place]

## 2017-04-24 ENCOUNTER — Encounter (HOSPITAL_COMMUNITY)
Admission: RE | Admit: 2017-04-24 | Discharge: 2017-04-24 | Disposition: A | Discharge status: Home / Self Care | Payer: Federal, State, Local not specified - PPO | Source: Ambulatory Visit | Attending: Student | Admitting: Student

## 2017-04-24 ENCOUNTER — Encounter (HOSPITAL_COMMUNITY): Payer: Self-pay

## 2017-04-24 ENCOUNTER — Encounter (HOSPITAL_BASED_OUTPATIENT_CLINIC_OR_DEPARTMENT_OTHER)
Admission: RE | Admit: 2017-04-24 | Discharge: 2017-04-24 | Disposition: A | Discharge status: Home / Self Care | Payer: Federal, State, Local not specified - PPO | Source: Ambulatory Visit | Attending: Student | Admitting: Student

## 2017-04-24 DIAGNOSIS — I519 Heart disease, unspecified: Secondary | ICD-10-CM | POA: Diagnosis not present

## 2017-04-24 DIAGNOSIS — R079 Chest pain, unspecified: Secondary | ICD-10-CM

## 2017-04-24 MED ORDER — TECHNETIUM TC 99M TETROFOSMIN IV KIT
30.0000 | PACK | Freq: Once | INTRAVENOUS | Status: AC | PRN
  Administered 2017-04-24: 30 via INTRAVENOUS

## 2017-04-24 MED ORDER — SODIUM CHLORIDE 0.9% FLUSH
INTRAVENOUS | Status: AC
  Administered 2017-04-24: 10 mL via INTRAVENOUS
  Filled 2017-04-24: qty 10

## 2017-04-24 MED ORDER — REGADENOSON 0.4 MG/5ML IV SOLN
INTRAVENOUS | Status: AC
  Administered 2017-04-24: 0.4 mg via INTRAVENOUS
  Filled 2017-04-24: qty 5

## 2017-04-25 ENCOUNTER — Encounter (HOSPITAL_COMMUNITY): Payer: Self-pay

## 2017-04-25 ENCOUNTER — Encounter (HOSPITAL_COMMUNITY)
Admission: RE | Admit: 2017-04-25 | Discharge: 2017-04-25 | Disposition: A | Discharge status: Home / Self Care | Payer: Federal, State, Local not specified - PPO | Source: Ambulatory Visit | Attending: Student | Admitting: Student

## 2017-04-25 LAB — NM MYOCAR MULTI W/SPECT W/WALL MOTION / EF
CHL CUP NUCLEAR SDS: 2
CHL CUP RESTING HR STRESS: 79 {beats}/min
CSEPPHR: 100 {beats}/min
LV dias vol: 96 mL (ref 46–106)
LVSYSVOL: 42 mL
RATE: 0.44
SRS: 1
SSS: 3
TID: 0.98

## 2017-04-25 MED ORDER — TECHNETIUM TC 99M TETROFOSMIN IV KIT
25.0000 | PACK | Freq: Once | INTRAVENOUS | Status: AC | PRN
  Administered 2017-04-25: 26 via INTRAVENOUS

## 2017-05-10 ENCOUNTER — Other Ambulatory Visit: Payer: Self-pay | Admitting: Internal Medicine

## 2017-05-10 MED ORDER — CARVEDILOL 25 MG PO TABS: ORAL_TABLET | ORAL | 0 refills | Status: DC

## 2017-05-10 NOTE — Addendum Note (Signed)
Addended by: Valrie Hart on: 05/10/2017 04:50 PM   Modules accepted: Orders

## 2017-05-16 DIAGNOSIS — H2513 Age-related nuclear cataract, bilateral: Secondary | ICD-10-CM | POA: Diagnosis not present

## 2017-05-16 DIAGNOSIS — H04123 Dry eye syndrome of bilateral lacrimal glands: Secondary | ICD-10-CM | POA: Diagnosis not present

## 2017-05-16 DIAGNOSIS — H4312 Vitreous hemorrhage, left eye: Secondary | ICD-10-CM | POA: Diagnosis not present

## 2017-05-16 DIAGNOSIS — E113553 Type 2 diabetes mellitus with stable proliferative diabetic retinopathy, bilateral: Secondary | ICD-10-CM | POA: Diagnosis not present

## 2017-07-02 DIAGNOSIS — E1140 Type 2 diabetes mellitus with diabetic neuropathy, unspecified: Secondary | ICD-10-CM | POA: Diagnosis not present

## 2017-07-02 DIAGNOSIS — E7800 Pure hypercholesterolemia, unspecified: Secondary | ICD-10-CM | POA: Diagnosis not present

## 2017-07-02 DIAGNOSIS — I10 Essential (primary) hypertension: Secondary | ICD-10-CM | POA: Diagnosis not present

## 2017-07-02 DIAGNOSIS — E1165 Type 2 diabetes mellitus with hyperglycemia: Secondary | ICD-10-CM | POA: Diagnosis not present

## 2017-07-05 DIAGNOSIS — E7800 Pure hypercholesterolemia, unspecified: Secondary | ICD-10-CM | POA: Diagnosis not present

## 2017-07-05 DIAGNOSIS — E1165 Type 2 diabetes mellitus with hyperglycemia: Secondary | ICD-10-CM | POA: Diagnosis not present

## 2017-07-05 DIAGNOSIS — I10 Essential (primary) hypertension: Secondary | ICD-10-CM | POA: Diagnosis not present

## 2017-07-05 DIAGNOSIS — E1140 Type 2 diabetes mellitus with diabetic neuropathy, unspecified: Secondary | ICD-10-CM | POA: Diagnosis not present

## 2017-07-19 DIAGNOSIS — Z6841 Body Mass Index (BMI) 40.0 and over, adult: Secondary | ICD-10-CM | POA: Diagnosis not present

## 2017-07-19 DIAGNOSIS — J0100 Acute maxillary sinusitis, unspecified: Secondary | ICD-10-CM | POA: Diagnosis not present

## 2017-07-19 DIAGNOSIS — R221 Localized swelling, mass and lump, neck: Secondary | ICD-10-CM | POA: Diagnosis not present

## 2017-07-19 DIAGNOSIS — Z1389 Encounter for screening for other disorder: Secondary | ICD-10-CM | POA: Diagnosis not present

## 2017-07-23 ENCOUNTER — Other Ambulatory Visit (HOSPITAL_COMMUNITY): Payer: Self-pay | Admitting: Family Medicine

## 2017-07-23 DIAGNOSIS — R221 Localized swelling, mass and lump, neck: Secondary | ICD-10-CM

## 2017-07-26 ENCOUNTER — Ambulatory Visit (HOSPITAL_COMMUNITY)
Admission: RE | Admit: 2017-07-26 | Discharge: 2017-07-26 | Disposition: A | Discharge status: Home / Self Care | Payer: Federal, State, Local not specified - PPO | Source: Ambulatory Visit | Attending: Family Medicine | Admitting: Family Medicine

## 2017-07-26 ENCOUNTER — Ambulatory Visit (HOSPITAL_COMMUNITY): Payer: Federal, State, Local not specified - PPO

## 2017-07-26 DIAGNOSIS — R221 Localized swelling, mass and lump, neck: Secondary | ICD-10-CM | POA: Insufficient documentation

## 2017-07-26 DIAGNOSIS — J0100 Acute maxillary sinusitis, unspecified: Secondary | ICD-10-CM | POA: Diagnosis not present

## 2017-08-09 ENCOUNTER — Other Ambulatory Visit: Payer: Self-pay | Admitting: Internal Medicine

## 2017-09-05 DIAGNOSIS — E038 Other specified hypothyroidism: Secondary | ICD-10-CM | POA: Diagnosis not present

## 2017-09-22 IMAGING — DX DG HAND COMPLETE 3+V*L*
3 series · 3 of 3 positions shown · non-contrast
Comparison: None.

CLINICAL DATA: 56 y/o  F; constant left wrist pain.

EXAM:
LEFT WRIST - COMPLETE 3+ VIEW; LEFT HAND - COMPLETE 3+ VIEW

[hand pa]
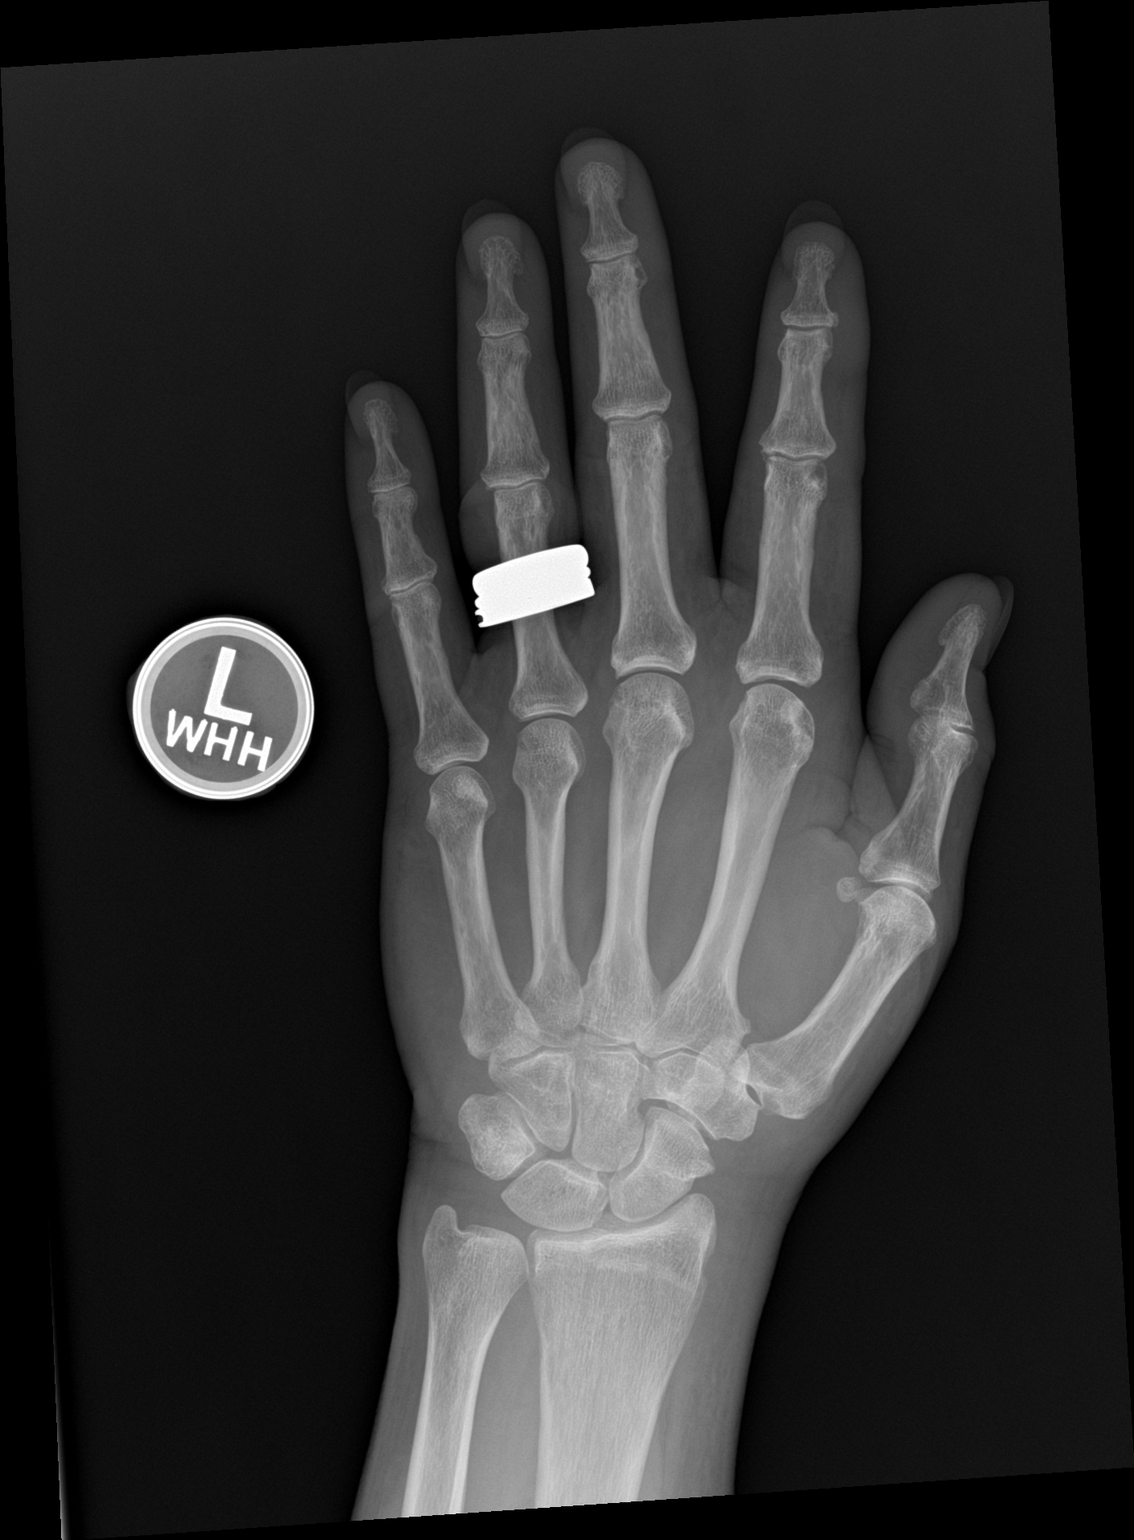

[hand obl]
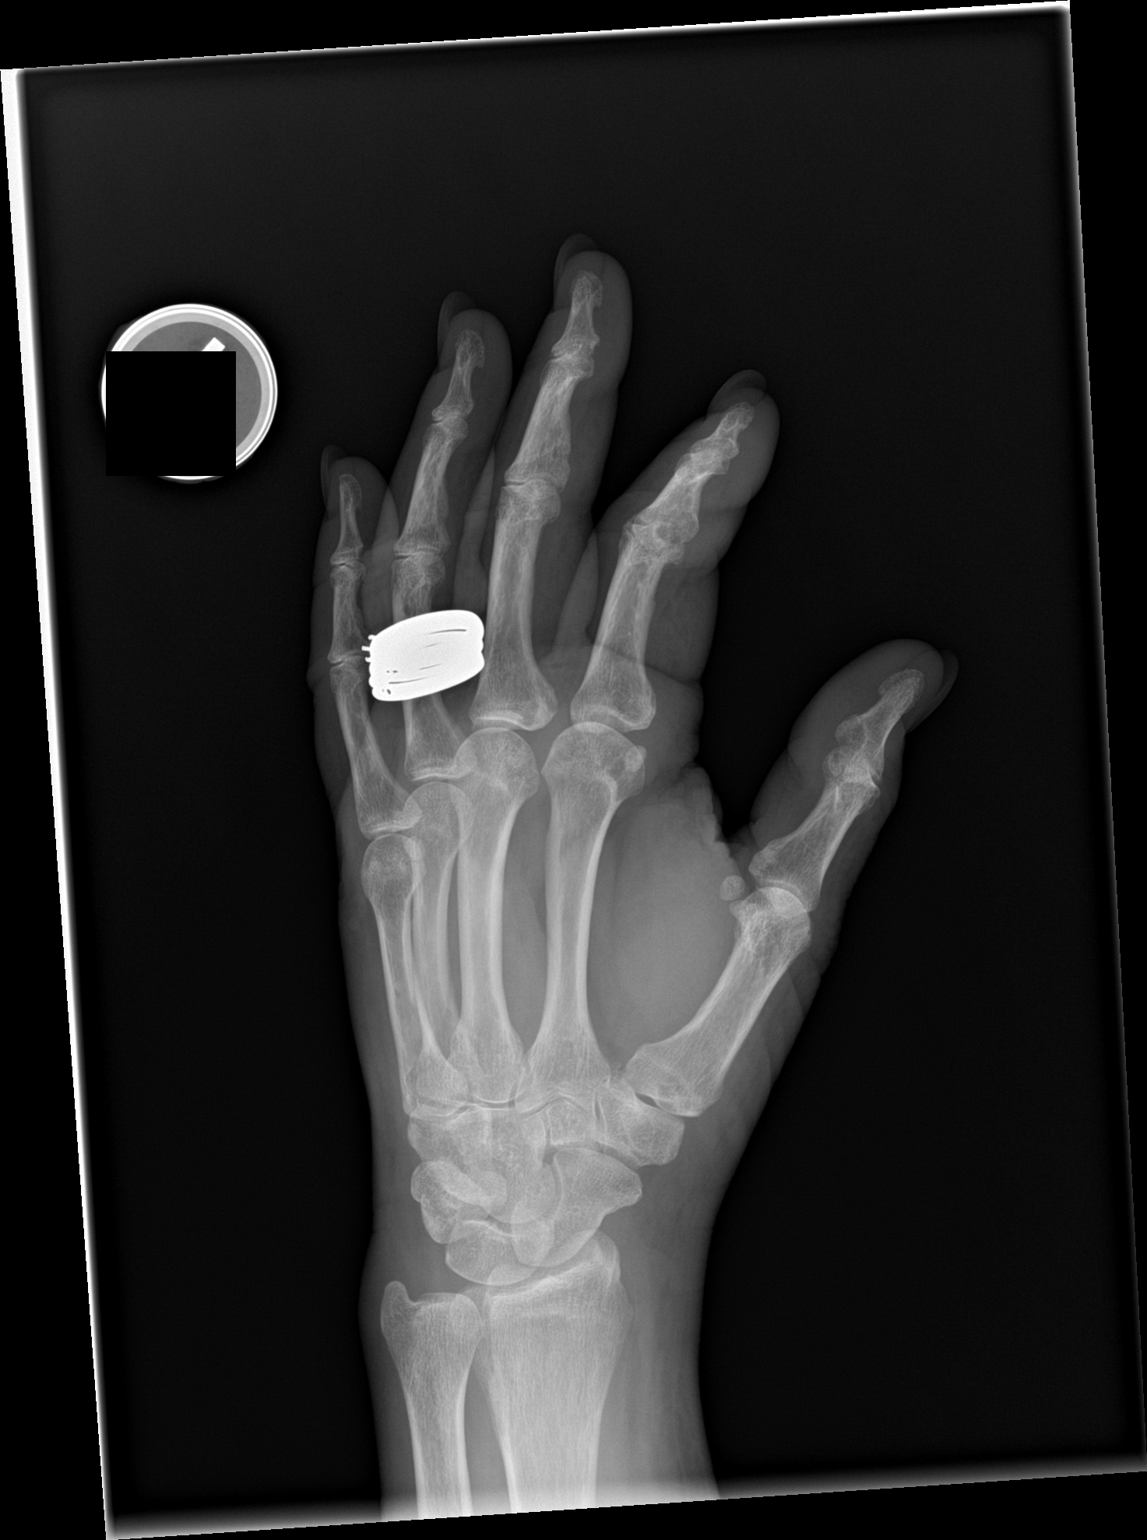

[hand lat]
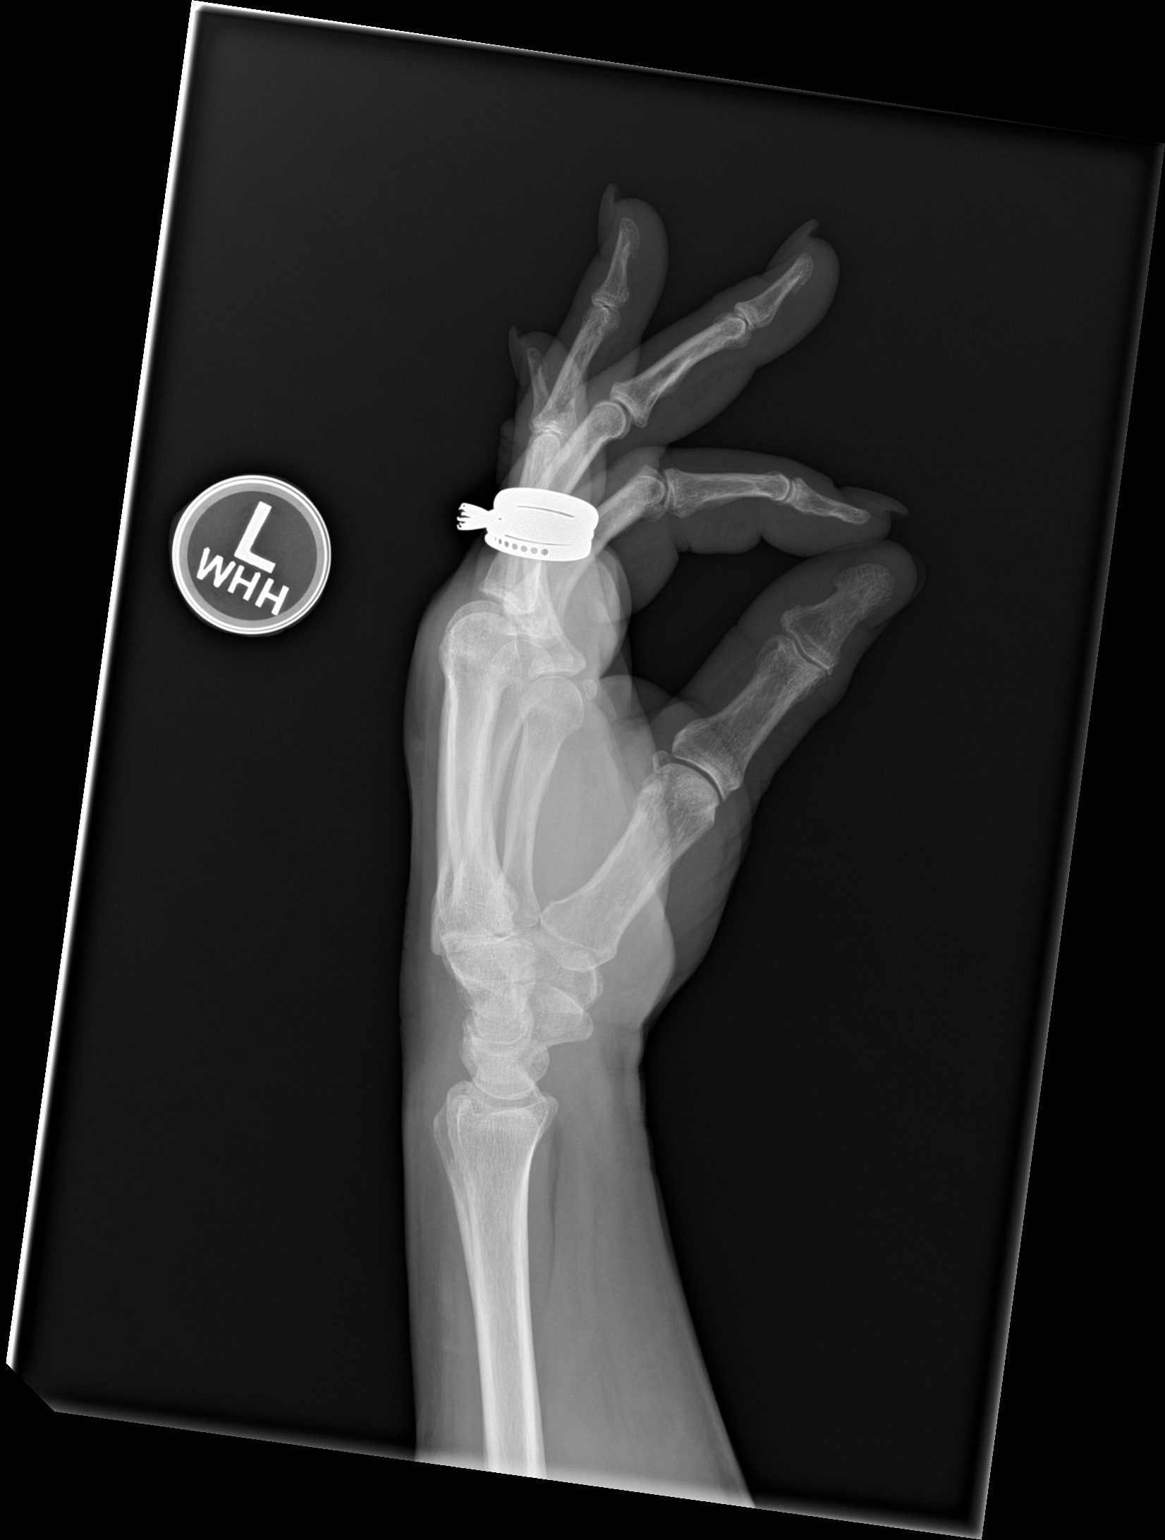

[3 of 3 positions shown; findings below may reference images not displayed]

FINDINGS: Left wrist:

There is no evidence of fracture or dislocation. Minimal
interphalangeal and basal joint osteoarthrosis with small
periarticular osteophytes.

Left hand:

There is no evidence of fracture or dislocation. There is no
evidence of arthropathy or other focal bone abnormality. Soft
tissues are unremarkable.
IMPRESSION: 1. No acute fracture or dislocation.
2. Radiocarpal joint is well maintained.
3. Minimal interphalangeal and basal joint osteoarthrosis with small
periarticular osteophytes.

By: Liu Ariel M.D.

## 2017-10-14 ENCOUNTER — Other Ambulatory Visit: Payer: Self-pay | Admitting: Internal Medicine

## 2017-11-07 ENCOUNTER — Telehealth: Payer: Self-pay | Admitting: Internal Medicine

## 2017-11-07 MED ORDER — CARVEDILOL 25 MG PO TABS: ORAL_TABLET | ORAL | 0 refills | Status: DC

## 2017-11-07 NOTE — Telephone Encounter (Signed)
New Message    *STAT* If patient is at the pharmacy, call can be transferred to refill team.   1. Which medications need to be refilled? (please list name of each medication and dose if known)   carvedilol (COREG) 25 MG tablet     2. Which pharmacy/location (including street and city if local pharmacy) is medication to be sent to?[id number] - [place], [place] - [address]  3. Do they need a 30 day or 90 day supply? 90 day

## 2017-11-07 NOTE — Telephone Encounter (Signed)
Pt was supposed to return 12/2016 for f/u, has never been back.Third attempt, needs f/u apt for refills

## 2017-11-07 NOTE — Telephone Encounter (Signed)
This is a [place] pt.  °

## 2017-11-11 ENCOUNTER — Other Ambulatory Visit: Payer: Self-pay | Admitting: Internal Medicine
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
## 2017-12-18 ENCOUNTER — Encounter: Payer: Self-pay | Admitting: Nurse Practitioner

## 2017-12-18 ENCOUNTER — Ambulatory Visit: Payer: Federal, State, Local not specified - PPO | Admitting: Nurse Practitioner

## 2017-12-18 VITALS — BP 128/88 | HR 80 | Ht 68.0 in | Wt 277.0 lb

## 2017-12-18 DIAGNOSIS — I471 Supraventricular tachycardia: Secondary | ICD-10-CM

## 2017-12-18 DIAGNOSIS — I428 Other cardiomyopathies: Secondary | ICD-10-CM

## 2017-12-18 DIAGNOSIS — I639 Cerebral infarction, unspecified: Secondary | ICD-10-CM | POA: Diagnosis not present

## 2017-12-18 LAB — BASIC METABOLIC PANEL
BUN / CREAT RATIO: 22 (ref 9–23)
BUN: 13 mg/dL (ref 6–24)
CALCIUM: 9.7 mg/dL (ref 8.7–10.2)
CO2: 26 mmol/L (ref 20–29)
CREATININE: 0.59 mg/dL (ref 0.57–1.00)
Chloride: 100 mmol/L (ref 96–106)
GFR calc Af Amer: 119 mL/min/{1.73_m2} (ref >59)
GFR, EST NON AFRICAN AMERICAN: 104 mL/min/{1.73_m2} (ref >59)
Glucose: 161 mg/dL — ABNORMAL HIGH (ref 65–99)
Potassium: 4.4 mmol/L (ref 3.5–5.2)
Sodium: 142 mmol/L (ref 134–144)

## 2017-12-18 LAB — CBC
HEMATOCRIT: 46.3 % (ref 34.0–46.6)
Hemoglobin: 15.8 g/dL (ref 11.1–15.9)
MCH: 30.2 pg (ref 26.6–33.0)
MCHC: 34.1 g/dL (ref 31.5–35.7)
MCV: 89 fL (ref 79–97)
Platelets: 262 10*3/uL (ref 150–450)
RBC: 5.23 x10E6/uL (ref 3.77–5.28)
RDW: 13.5 % (ref 12.3–15.4)
WBC: 8.5 10*3/uL (ref 3.4–10.8)

## 2017-12-18 MED ORDER — CARVEDILOL 25 MG PO TABS: ORAL_TABLET | ORAL | 4 refills | Status: DC

## 2017-12-18 MED ORDER — NITROGLYCERIN 0.4 MG SL SUBL: 0.4000 mg | SUBLINGUAL_TABLET | SUBLINGUAL | 3 refills | Status: AC | PRN

## 2017-12-18 NOTE — Patient Instructions (Addendum)
Medication Instructions:   Your physician recommends that you continue on your current medications as directed. Please refer to the Current Medication list given to you today.   If you need a refill on your cardiac medications before your next appointment, please call your pharmacy.  Labwork:  BMET AND CBC TODAY    Testing/Procedures: NONE ORDERED  TODAY    Follow-Up: Your physician wants you to follow-up in: ONE YEAR WITH Ladona Ridgel You will receive a reminder letter in the mail two months in advance. If you don't receive a letter, please call our office to schedule the follow-up appointment.     Any Other Special Instructions Will Be Listed Below (If Applicable).

## 2017-12-19 ENCOUNTER — Telehealth: Payer: Self-pay

## 2017-12-19 NOTE — Telephone Encounter (Signed)
Notes recorded by Sigurd Sos, RN on 12/19/2017 at 9:10 AM EDT The patient has been notified of the result and verbalized understanding. All questions (if any) were answered. Sigurd Sos, RN 12/19/2017 9:10 AM   ------

## 2017-12-19 NOTE — Telephone Encounter (Signed)
-----   Message from Marily Lente, NP sent at 12/18/2017  3:38 PM EDT ----- All values are normal or within acceptable limits.   Medication changes / Follow up labs / Other changes or recommendations:   Please send copy to PCP for elevated glucose.  Gypsy Balsam, NP 12/18/2017 3:38 PM

## 2018-01-03 ENCOUNTER — Other Ambulatory Visit: Payer: Self-pay

## 2018-01-03 MED ORDER — PRAVASTATIN SODIUM 20 MG PO TABS: 20.0000 mg | ORAL_TABLET | Freq: Every evening | ORAL | 1 refills | Status: DC

## 2018-01-03 NOTE — Telephone Encounter (Signed)
refiled pravachol per fax request

## 2018-01-06 DIAGNOSIS — E7800 Pure hypercholesterolemia, unspecified: Secondary | ICD-10-CM | POA: Diagnosis not present

## 2018-01-06 DIAGNOSIS — I10 Essential (primary) hypertension: Secondary | ICD-10-CM | POA: Diagnosis not present

## 2018-01-06 DIAGNOSIS — E1140 Type 2 diabetes mellitus with diabetic neuropathy, unspecified: Secondary | ICD-10-CM | POA: Diagnosis not present

## 2018-01-06 DIAGNOSIS — E1165 Type 2 diabetes mellitus with hyperglycemia: Secondary | ICD-10-CM | POA: Diagnosis not present

## 2018-02-06 DIAGNOSIS — H04123 Dry eye syndrome of bilateral lacrimal glands: Secondary | ICD-10-CM | POA: Diagnosis not present

## 2018-03-04 DIAGNOSIS — M797 Fibromyalgia: Secondary | ICD-10-CM | POA: Diagnosis not present

## 2018-03-04 DIAGNOSIS — K080 Exfoliation of teeth due to systemic causes: Secondary | ICD-10-CM | POA: Diagnosis not present

## 2018-03-04 DIAGNOSIS — E113393 Type 2 diabetes mellitus with moderate nonproliferative diabetic retinopathy without macular edema, bilateral: Secondary | ICD-10-CM | POA: Diagnosis not present

## 2018-03-04 DIAGNOSIS — F419 Anxiety disorder, unspecified: Secondary | ICD-10-CM | POA: Diagnosis not present

## 2018-03-04 DIAGNOSIS — F320 Major depressive disorder, single episode, mild: Secondary | ICD-10-CM | POA: Diagnosis not present

## 2018-03-04 DIAGNOSIS — Z681 Body mass index (BMI) 19 or less, adult: Secondary | ICD-10-CM | POA: Diagnosis not present

## 2018-03-04 DIAGNOSIS — J019 Acute sinusitis, unspecified: Secondary | ICD-10-CM | POA: Diagnosis not present

## 2018-04-03 ENCOUNTER — Other Ambulatory Visit: Payer: Self-pay | Admitting: Internal Medicine

## 2018-04-20 ENCOUNTER — Other Ambulatory Visit: Payer: Self-pay | Admitting: Cardiology

## 2018-05-21 DIAGNOSIS — J209 Acute bronchitis, unspecified: Secondary | ICD-10-CM | POA: Diagnosis not present

## 2018-05-21 DIAGNOSIS — Z6841 Body Mass Index (BMI) 40.0 and over, adult: Secondary | ICD-10-CM | POA: Diagnosis not present

## 2018-05-21 DIAGNOSIS — J22 Unspecified acute lower respiratory infection: Secondary | ICD-10-CM | POA: Diagnosis not present

## 2018-05-21 DIAGNOSIS — B349 Viral infection, unspecified: Secondary | ICD-10-CM | POA: Diagnosis not present

## 2018-05-30 DIAGNOSIS — J209 Acute bronchitis, unspecified: Secondary | ICD-10-CM | POA: Diagnosis not present

## 2018-05-30 DIAGNOSIS — Z681 Body mass index (BMI) 19 or less, adult: Secondary | ICD-10-CM | POA: Diagnosis not present

## 2018-06-12 DIAGNOSIS — L821 Other seborrheic keratosis: Secondary | ICD-10-CM | POA: Diagnosis not present

## 2018-06-12 DIAGNOSIS — D225 Melanocytic nevi of trunk: Secondary | ICD-10-CM | POA: Diagnosis not present

## 2018-06-12 DIAGNOSIS — L920 Granuloma annulare: Secondary | ICD-10-CM | POA: Diagnosis not present

## 2018-06-12 DIAGNOSIS — L918 Other hypertrophic disorders of the skin: Secondary | ICD-10-CM | POA: Diagnosis not present

## 2018-06-12 DIAGNOSIS — L232 Allergic contact dermatitis due to cosmetics: Secondary | ICD-10-CM | POA: Diagnosis not present

## 2018-06-30 ENCOUNTER — Other Ambulatory Visit: Payer: Self-pay | Admitting: Cardiology

## 2018-07-20 ENCOUNTER — Other Ambulatory Visit: Payer: Self-pay | Admitting: Cardiology

## 2018-09-04 DIAGNOSIS — K080 Exfoliation of teeth due to systemic causes: Secondary | ICD-10-CM | POA: Diagnosis not present

## 2018-09-25 ENCOUNTER — Other Ambulatory Visit: Payer: Self-pay | Admitting: Cardiology

## 2018-09-25 DIAGNOSIS — F419 Anxiety disorder, unspecified: Secondary | ICD-10-CM | POA: Diagnosis not present

## 2018-09-25 DIAGNOSIS — F329 Major depressive disorder, single episode, unspecified: Secondary | ICD-10-CM | POA: Diagnosis not present

## 2018-10-24 ENCOUNTER — Other Ambulatory Visit: Payer: Self-pay | Admitting: Cardiology

## 2018-10-29 DIAGNOSIS — E1165 Type 2 diabetes mellitus with hyperglycemia: Secondary | ICD-10-CM | POA: Diagnosis not present

## 2018-10-29 DIAGNOSIS — E7800 Pure hypercholesterolemia, unspecified: Secondary | ICD-10-CM | POA: Diagnosis not present

## 2018-10-29 DIAGNOSIS — E119 Type 2 diabetes mellitus without complications: Secondary | ICD-10-CM | POA: Diagnosis not present

## 2018-10-29 DIAGNOSIS — E038 Other specified hypothyroidism: Secondary | ICD-10-CM | POA: Diagnosis not present

## 2018-11-03 ENCOUNTER — Other Ambulatory Visit: Payer: Self-pay | Admitting: Internal Medicine

## 2018-11-03 ENCOUNTER — Other Ambulatory Visit: Payer: Self-pay | Admitting: Cardiology

## 2018-11-05 DIAGNOSIS — E1165 Type 2 diabetes mellitus with hyperglycemia: Secondary | ICD-10-CM | POA: Diagnosis not present

## 2018-11-05 DIAGNOSIS — E1140 Type 2 diabetes mellitus with diabetic neuropathy, unspecified: Secondary | ICD-10-CM | POA: Diagnosis not present

## 2018-11-05 DIAGNOSIS — E7800 Pure hypercholesterolemia, unspecified: Secondary | ICD-10-CM | POA: Diagnosis not present

## 2018-11-05 DIAGNOSIS — I10 Essential (primary) hypertension: Secondary | ICD-10-CM | POA: Diagnosis not present

## 2018-11-11 ENCOUNTER — Encounter (HOSPITAL_COMMUNITY): Payer: Self-pay | Admitting: Emergency Medicine

## 2018-11-11 ENCOUNTER — Emergency Department (HOSPITAL_COMMUNITY)
Admission: EM | Admit: 2018-11-11 | Discharge: 2018-11-12 | Disposition: A | Discharge status: Home / Self Care | Payer: Federal, State, Local not specified - PPO | Attending: Emergency Medicine | Admitting: Emergency Medicine

## 2018-11-11 ENCOUNTER — Other Ambulatory Visit: Payer: Self-pay

## 2018-11-11 DIAGNOSIS — S90562A Insect bite (nonvenomous), left ankle, initial encounter: Secondary | ICD-10-CM | POA: Diagnosis not present

## 2018-11-11 DIAGNOSIS — I259 Chronic ischemic heart disease, unspecified: Secondary | ICD-10-CM | POA: Insufficient documentation

## 2018-11-11 DIAGNOSIS — I110 Hypertensive heart disease with heart failure: Secondary | ICD-10-CM | POA: Diagnosis not present

## 2018-11-11 DIAGNOSIS — S60562D Insect bite (nonvenomous) of left hand, subsequent encounter: Secondary | ICD-10-CM | POA: Diagnosis not present

## 2018-11-11 DIAGNOSIS — E119 Type 2 diabetes mellitus without complications: Secondary | ICD-10-CM | POA: Diagnosis not present

## 2018-11-11 DIAGNOSIS — I5042 Chronic combined systolic (congestive) and diastolic (congestive) heart failure: Secondary | ICD-10-CM | POA: Diagnosis not present

## 2018-11-11 DIAGNOSIS — Z7901 Long term (current) use of anticoagulants: Secondary | ICD-10-CM | POA: Diagnosis not present

## 2018-11-11 DIAGNOSIS — Y999 Unspecified external cause status: Secondary | ICD-10-CM | POA: Insufficient documentation

## 2018-11-11 DIAGNOSIS — Z79899 Other long term (current) drug therapy: Secondary | ICD-10-CM | POA: Diagnosis not present

## 2018-11-11 DIAGNOSIS — W57XXXA Bitten or stung by nonvenomous insect and other nonvenomous arthropods, initial encounter: Secondary | ICD-10-CM | POA: Diagnosis not present

## 2018-11-11 DIAGNOSIS — Z23 Encounter for immunization: Secondary | ICD-10-CM | POA: Insufficient documentation

## 2018-11-11 DIAGNOSIS — Z7982 Long term (current) use of aspirin: Secondary | ICD-10-CM | POA: Insufficient documentation

## 2018-11-11 DIAGNOSIS — Z794 Long term (current) use of insulin: Secondary | ICD-10-CM | POA: Diagnosis not present

## 2018-11-11 DIAGNOSIS — Y9301 Activity, walking, marching and hiking: Secondary | ICD-10-CM | POA: Diagnosis not present

## 2018-11-11 DIAGNOSIS — Y92828 Other wilderness area as the place of occurrence of the external cause: Secondary | ICD-10-CM | POA: Diagnosis not present

## 2018-11-11 MED ORDER — DOXYCYCLINE HYCLATE 100 MG PO TABS
100.0000 mg | ORAL_TABLET | Freq: Once | ORAL | Status: AC
  Administered 2018-11-11: 100 mg via ORAL
  Filled 2018-11-11: qty 1

## 2018-11-11 MED ORDER — TETANUS-DIPHTH-ACELL PERTUSSIS 5-2.5-18.5 LF-MCG/0.5 IM SUSP
0.5000 mL | Freq: Once | INTRAMUSCULAR | Status: AC
  Administered 2018-11-11: 0.5 mL via INTRAMUSCULAR
  Filled 2018-11-11: qty 0.5

## 2018-11-11 NOTE — ED Triage Notes (Signed)
Pt here to have tick removed for her left foot.
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
## 2018-11-12 MED ORDER — DOXYCYCLINE HYCLATE 100 MG PO CAPS: 100.0000 mg | ORAL_CAPSULE | Freq: Two times a day (BID) | ORAL | 0 refills | Status: DC

## 2018-11-12 NOTE — Discharge Instructions (Addendum)
Take antibiotics as prescribed.  Follow-up with your doctor.  Return to the ED with worsening symptoms including fever, headache, rash or other concerns.

## 2018-11-21 ENCOUNTER — Other Ambulatory Visit: Payer: Self-pay

## 2018-11-21 DIAGNOSIS — J069 Acute upper respiratory infection, unspecified: Secondary | ICD-10-CM | POA: Diagnosis not present

## 2018-11-21 DIAGNOSIS — R6889 Other general symptoms and signs: Secondary | ICD-10-CM | POA: Diagnosis not present

## 2018-11-21 DIAGNOSIS — Z20822 Contact with and (suspected) exposure to covid-19: Secondary | ICD-10-CM

## 2018-11-22 LAB — NOVEL CORONAVIRUS, NAA: SARS-CoV-2, NAA: NOT DETECTED

## 2018-11-27 ENCOUNTER — Other Ambulatory Visit: Payer: Self-pay | Admitting: Cardiology

## 2018-12-05 ENCOUNTER — Other Ambulatory Visit: Payer: Self-pay | Admitting: Internal Medicine

## 2018-12-05 MED ORDER — APIXABAN 5 MG PO TABS: 5.0000 mg | ORAL_TABLET | Freq: Two times a day (BID) | ORAL | 1 refills | Status: DC

## 2018-12-05 MED ORDER — CARVEDILOL 25 MG PO TABS: ORAL_TABLET | ORAL | 1 refills | Status: DC

## 2018-12-05 NOTE — Telephone Encounter (Signed)
Needing 90 day supply sent in on pt's   carvedilol (COREG) 25 MG tablet [[id number]]   ELIQUIS 5 MG TABS tablet [[id number]]   So she's not charged full price

## 2018-12-05 NOTE — Telephone Encounter (Signed)
Refills sent to mail order 

## 2018-12-18 ENCOUNTER — Ambulatory Visit: Payer: Federal, State, Local not specified - PPO | Admitting: Internal Medicine

## 2018-12-24 ENCOUNTER — Ambulatory Visit: Payer: Federal, State, Local not specified - PPO | Admitting: Internal Medicine

## 2018-12-24 ENCOUNTER — Other Ambulatory Visit: Payer: Self-pay

## 2018-12-24 ENCOUNTER — Encounter: Payer: Self-pay | Admitting: Internal Medicine

## 2018-12-24 ENCOUNTER — Encounter: Payer: Self-pay | Admitting: *Deleted

## 2018-12-24 VITALS — BP 124/78 | HR 81 | Temp 96.2°F | Ht 68.0 in | Wt 277.0 lb

## 2018-12-24 DIAGNOSIS — I10 Essential (primary) hypertension: Secondary | ICD-10-CM

## 2018-12-24 DIAGNOSIS — R0683 Snoring: Secondary | ICD-10-CM

## 2018-12-24 NOTE — Addendum Note (Signed)
Addended by: Levonne Hubert on: 12/24/2018 09:16 AM   Modules accepted: Orders

## 2018-12-24 NOTE — Patient Instructions (Signed)
Medication Instructions:  Your physician recommends that you continue on your current medications as directed. Please refer to the Current Medication list given to you today.  *If you need a refill on your cardiac medications before your next appointment, please call your pharmacy*  Lab Work: NONE   If you have labs (blood work) drawn today and your tests are completely normal, you will receive your results only by: Marland Kitchen MyChart Message (if you have MyChart) OR . A paper copy in the mail If you have any lab test that is abnormal or we need to change your treatment, we will call you to review the results.  Testing/Procedures: Your physician has recommended that you have a sleep study. This test records several body functions during sleep, including: brain activity, eye movement, oxygen and carbon dioxide blood levels, heart rate and rhythm, breathing rate and rhythm, the flow of air through your mouth and nose, snoring, body muscle movements, and chest and belly movement.    Follow-Up: At [facility], you and your health needs are our priority.  As part of our continuing mission to provide you with exceptional heart care, we have created designated Provider Care Teams.  These Care Teams include your primary Cardiologist (physician) and Advanced Practice Providers (APPs -  Physician Assistants and Nurse Practitioners) who all work together to provide you with the care you need, when you need it.  Your next appointment:   12 months  The format for your next appointment:   In Person  Provider:   Cristopher Peru, MD  Other Instructions Thank you for choosing [facility]!
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
## 2018-12-25 DIAGNOSIS — E113553 Type 2 diabetes mellitus with stable proliferative diabetic retinopathy, bilateral: Secondary | ICD-10-CM | POA: Diagnosis not present

## 2018-12-25 DIAGNOSIS — H2513 Age-related nuclear cataract, bilateral: Secondary | ICD-10-CM | POA: Diagnosis not present

## 2018-12-25 DIAGNOSIS — H04123 Dry eye syndrome of bilateral lacrimal glands: Secondary | ICD-10-CM | POA: Diagnosis not present

## 2018-12-25 DIAGNOSIS — H4312 Vitreous hemorrhage, left eye: Secondary | ICD-10-CM | POA: Diagnosis not present

## 2019-01-08 DIAGNOSIS — F329 Major depressive disorder, single episode, unspecified: Secondary | ICD-10-CM | POA: Diagnosis not present

## 2019-01-22 ENCOUNTER — Other Ambulatory Visit: Payer: Self-pay | Admitting: Nurse Practitioner

## 2019-03-05 DIAGNOSIS — E1165 Type 2 diabetes mellitus with hyperglycemia: Secondary | ICD-10-CM | POA: Diagnosis not present

## 2019-03-05 DIAGNOSIS — E7800 Pure hypercholesterolemia, unspecified: Secondary | ICD-10-CM | POA: Diagnosis not present

## 2019-03-05 DIAGNOSIS — E1140 Type 2 diabetes mellitus with diabetic neuropathy, unspecified: Secondary | ICD-10-CM | POA: Diagnosis not present

## 2019-03-05 DIAGNOSIS — I10 Essential (primary) hypertension: Secondary | ICD-10-CM | POA: Diagnosis not present

## 2019-03-26 ENCOUNTER — Other Ambulatory Visit: Payer: Self-pay | Admitting: Internal Medicine

## 2019-04-01 DIAGNOSIS — J019 Acute sinusitis, unspecified: Secondary | ICD-10-CM | POA: Diagnosis not present

## 2019-04-01 DIAGNOSIS — E113513 Type 2 diabetes mellitus with proliferative diabetic retinopathy with macular edema, bilateral: Secondary | ICD-10-CM | POA: Diagnosis not present

## 2019-04-01 DIAGNOSIS — H2513 Age-related nuclear cataract, bilateral: Secondary | ICD-10-CM | POA: Diagnosis not present

## 2019-04-01 DIAGNOSIS — Z6841 Body Mass Index (BMI) 40.0 and over, adult: Secondary | ICD-10-CM | POA: Diagnosis not present

## 2019-04-02 ENCOUNTER — Other Ambulatory Visit: Payer: Self-pay

## 2019-04-02 ENCOUNTER — Ambulatory Visit: Payer: Federal, State, Local not specified - PPO | Attending: Internal Medicine

## 2019-04-02 DIAGNOSIS — Z20822 Contact with and (suspected) exposure to covid-19: Secondary | ICD-10-CM | POA: Diagnosis not present

## 2019-04-03 LAB — NOVEL CORONAVIRUS, NAA: SARS-CoV-2, NAA: DETECTED — AB

## 2019-04-04 ENCOUNTER — Other Ambulatory Visit: Payer: Self-pay | Admitting: Physician Assistant

## 2019-04-04 DIAGNOSIS — I428 Other cardiomyopathies: Secondary | ICD-10-CM

## 2019-04-04 DIAGNOSIS — I471 Supraventricular tachycardia: Secondary | ICD-10-CM

## 2019-04-04 DIAGNOSIS — U071 COVID-19: Secondary | ICD-10-CM

## 2019-04-04 DIAGNOSIS — I10 Essential (primary) hypertension: Secondary | ICD-10-CM

## 2019-04-04 NOTE — Progress Notes (Signed)
  I connected by phone with Vanessa Silva on 04/04/2019 at 9:57 AM to discuss the potential use of an new treatment for mild to moderate COVID-19 viral infection in non-hospitalized patients.  This patient is a 59 y.o. female that meets the FDA criteria for Emergency Use Authorization of bamlanivimab or casirivimab\imdevimab.  Has a (+) direct SARS-CoV-2 viral test result  Has mild or moderate COVID-19   Is ? 59 years of age and weighs ? 40 kg  Is NOT hospitalized due to COVID-19  Is NOT requiring oxygen therapy or requiring an increase in baseline oxygen flow rate due to COVID-19  Is within 10 days of symptom onset  Has at least one of the high risk factor(s) for progression to severe COVID-19 and/or hospitalization as defined in EUA. Specific high risk criteria : Hypertension , CVD, morbid obesity, DMT2  I have spoken and communicated the following to the patient or parent/caregiver:  1. FDA has authorized the emergency use of bamlanivimab and casirivimab\imdevimab for the treatment of mild to moderate COVID-19 in adults and pediatric patients with positive results of direct SARS-CoV-2 viral testing who are 62 years of age and older weighing at least 40 kg, and who are at high risk for progressing to severe COVID-19 and/or hospitalization.  2. The significant known and potential risks and benefits of bamlanivimab and casirivimab\imdevimab, and the extent to which such potential risks and benefits are unknown.  3. Information on available alternative treatments and the risks and benefits of those alternatives, including clinical trials.  4. Patients treated with bamlanivimab and casirivimab\imdevimab should continue to self-isolate and use infection control measures (e.g., wear mask, isolate, social distance, avoid sharing personal items, clean and disinfect "high touch" surfaces, and frequent handwashing) according to CDC guidelines.   5. The patient or parent/caregiver has the  option to accept or refuse bamlanivimab or casirivimab\imdevimab .  After reviewing this information with the patient, The patient agreed to proceed with receiving the bamlanimivab infusion and will be provided a copy of the Fact sheet prior to receiving the infusion.   Pt set up for 04/05/19 @8 :30am. Sx onset 03/30/19. Directions given.   05/28/19 04/04/2019 9:57 AM

## 2019-04-05 ENCOUNTER — Ambulatory Visit (HOSPITAL_COMMUNITY)
Admission: RE | Admit: 2019-04-05 | Discharge: 2019-04-05 | Disposition: A | Discharge status: Home / Self Care | Payer: Federal, State, Local not specified - PPO | Source: Ambulatory Visit | Attending: Pulmonary Disease | Admitting: Pulmonary Disease

## 2019-04-05 DIAGNOSIS — I428 Other cardiomyopathies: Secondary | ICD-10-CM | POA: Insufficient documentation

## 2019-04-05 DIAGNOSIS — U071 COVID-19: Secondary | ICD-10-CM | POA: Insufficient documentation

## 2019-04-05 DIAGNOSIS — I10 Essential (primary) hypertension: Secondary | ICD-10-CM | POA: Insufficient documentation

## 2019-04-05 DIAGNOSIS — I471 Supraventricular tachycardia: Secondary | ICD-10-CM | POA: Insufficient documentation

## 2019-04-05 DIAGNOSIS — Z23 Encounter for immunization: Secondary | ICD-10-CM | POA: Diagnosis not present

## 2019-04-05 MED ORDER — DIPHENHYDRAMINE HCL 50 MG/ML IJ SOLN: 50.0000 mg | Freq: Once | INTRAMUSCULAR | Status: DC | PRN

## 2019-04-05 MED ORDER — ALBUTEROL SULFATE HFA 108 (90 BASE) MCG/ACT IN AERS: 2.0000 | INHALATION_SPRAY | Freq: Once | RESPIRATORY_TRACT | Status: DC | PRN

## 2019-04-05 MED ORDER — SODIUM CHLORIDE 0.9 % IV SOLN
700.0000 mg | Freq: Once | INTRAVENOUS | Status: AC
  Administered 2019-04-05: 700 mg via INTRAVENOUS
  Filled 2019-04-05: qty 20

## 2019-04-05 MED ORDER — SODIUM CHLORIDE 0.9 % IV SOLN: INTRAVENOUS | Status: DC | PRN

## 2019-04-05 MED ORDER — FAMOTIDINE IN NACL 20-0.9 MG/50ML-% IV SOLN: 20.0000 mg | Freq: Once | INTRAVENOUS | Status: DC | PRN

## 2019-04-05 MED ORDER — EPINEPHRINE 0.3 MG/0.3ML IJ SOAJ: 0.3000 mg | Freq: Once | INTRAMUSCULAR | Status: DC | PRN

## 2019-04-05 MED ORDER — METHYLPREDNISOLONE SODIUM SUCC 125 MG IJ SOLR: 125.0000 mg | Freq: Once | INTRAMUSCULAR | Status: DC | PRN

## 2019-04-05 NOTE — Progress Notes (Signed)
  Diagnosis: COVID-19  Physician: Dr. Wright  Procedure: Covid Infusion Clinic Med: bamlanivimab infusion - Provided patient with bamlanimivab fact sheet for patients, parents and caregivers prior to infusion.  Complications: No immediate complications noted.  Discharge: Discharged home   Vanessa Silva 04/05/2019  

## 2019-04-05 NOTE — Discharge Instructions (Signed)

## 2019-04-28 ENCOUNTER — Other Ambulatory Visit: Payer: Self-pay

## 2019-04-28 ENCOUNTER — Ambulatory Visit: Payer: Federal, State, Local not specified - PPO | Attending: Internal Medicine

## 2019-04-28 DIAGNOSIS — Z20822 Contact with and (suspected) exposure to covid-19: Secondary | ICD-10-CM

## 2019-04-29 LAB — NOVEL CORONAVIRUS, NAA: SARS-CoV-2, NAA: NOT DETECTED

## 2019-04-30 DIAGNOSIS — Z6841 Body Mass Index (BMI) 40.0 and over, adult: Secondary | ICD-10-CM | POA: Diagnosis not present

## 2019-04-30 DIAGNOSIS — E119 Type 2 diabetes mellitus without complications: Secondary | ICD-10-CM | POA: Diagnosis not present

## 2019-04-30 DIAGNOSIS — U071 COVID-19: Secondary | ICD-10-CM | POA: Diagnosis not present

## 2019-04-30 DIAGNOSIS — J069 Acute upper respiratory infection, unspecified: Secondary | ICD-10-CM | POA: Diagnosis not present

## 2019-05-01 DIAGNOSIS — E119 Type 2 diabetes mellitus without complications: Secondary | ICD-10-CM | POA: Diagnosis not present

## 2019-05-28 ENCOUNTER — Other Ambulatory Visit: Payer: Self-pay | Admitting: Internal Medicine

## 2019-06-25 DIAGNOSIS — E7800 Pure hypercholesterolemia, unspecified: Secondary | ICD-10-CM | POA: Diagnosis not present

## 2019-06-25 DIAGNOSIS — E038 Other specified hypothyroidism: Secondary | ICD-10-CM | POA: Diagnosis not present

## 2019-06-25 DIAGNOSIS — E1165 Type 2 diabetes mellitus with hyperglycemia: Secondary | ICD-10-CM | POA: Diagnosis not present

## 2019-06-28 ENCOUNTER — Other Ambulatory Visit: Payer: Self-pay | Admitting: Internal Medicine

## 2019-06-29 NOTE — Telephone Encounter (Signed)
Pt last saw Dr Ladona Ridgel 12/24/18, last labs per [facility] on 11/16/18 at [facility]. although no actual results in [facility].  Will call [facility] and request BMP results be faxed to [place]. Age 59, weight 125.6kg, will await lab results to refill rx.

## 2019-06-29 NOTE — Telephone Encounter (Signed)
Received fax from [facility] labs done on 10/29/18 Creat 0.8, age 59, weight 125.6kg, based on specified criteria pt is on appropriate dosage of Eliquis 5mg  BID.  Will refill rx.

## 2019-07-02 DIAGNOSIS — E7800 Pure hypercholesterolemia, unspecified: Secondary | ICD-10-CM | POA: Diagnosis not present

## 2019-07-02 DIAGNOSIS — E1165 Type 2 diabetes mellitus with hyperglycemia: Secondary | ICD-10-CM | POA: Diagnosis not present

## 2019-07-02 DIAGNOSIS — E1140 Type 2 diabetes mellitus with diabetic neuropathy, unspecified: Secondary | ICD-10-CM | POA: Diagnosis not present

## 2019-07-02 DIAGNOSIS — I10 Essential (primary) hypertension: Secondary | ICD-10-CM | POA: Diagnosis not present

## 2019-09-24 DIAGNOSIS — H2513 Age-related nuclear cataract, bilateral: Secondary | ICD-10-CM | POA: Diagnosis not present

## 2019-09-24 DIAGNOSIS — E113553 Type 2 diabetes mellitus with stable proliferative diabetic retinopathy, bilateral: Secondary | ICD-10-CM | POA: Diagnosis not present

## 2019-09-24 DIAGNOSIS — H4312 Vitreous hemorrhage, left eye: Secondary | ICD-10-CM | POA: Diagnosis not present

## 2019-10-21 ENCOUNTER — Other Ambulatory Visit: Payer: Self-pay | Admitting: Internal Medicine

## 2019-10-29 DIAGNOSIS — J069 Acute upper respiratory infection, unspecified: Secondary | ICD-10-CM | POA: Diagnosis not present

## 2019-10-29 DIAGNOSIS — Z681 Body mass index (BMI) 19 or less, adult: Secondary | ICD-10-CM | POA: Diagnosis not present

## 2019-11-09 ENCOUNTER — Other Ambulatory Visit: Payer: Federal, State, Local not specified - PPO

## 2019-11-24 ENCOUNTER — Ambulatory Visit: Payer: Federal, State, Local not specified - PPO | Attending: Internal Medicine

## 2019-11-24 DIAGNOSIS — Z23 Encounter for immunization: Secondary | ICD-10-CM

## 2019-11-24 NOTE — Progress Notes (Signed)
   Covid-19 Vaccination Clinic  Name:  Vanessa Silva    MRN: [id number] DOB: 1960-03-20  11/24/2019  [name] was observed post Covid-19 immunization for 15 minutes without incident. She was provided with Vaccine Information Sheet and instruction to access the V-Safe system.   [name] was instructed to call 911 with any severe reactions post vaccine: Marland Kitchen Difficulty breathing  . Swelling of face and throat  . A fast heartbeat  . A bad rash all over body  . Dizziness and weakness   Immunizations Administered    Name Date Dose VIS Date Route   Pfizer COVID-19 Vaccine 11/24/2019  3:36 PM 0.3 mL 04/22/2018 Intramuscular   Manufacturer: [employer], [employer]   Lot: [id number]   NDC: [address]-4

## 2019-12-05 ENCOUNTER — Other Ambulatory Visit: Payer: Self-pay | Admitting: Cardiology

## 2019-12-07 ENCOUNTER — Other Ambulatory Visit: Payer: Self-pay | Admitting: *Deleted

## 2019-12-07 ENCOUNTER — Other Ambulatory Visit: Payer: Federal, State, Local not specified - PPO

## 2019-12-07 DIAGNOSIS — Z20822 Contact with and (suspected) exposure to covid-19: Secondary | ICD-10-CM

## 2019-12-08 LAB — SARS-COV-2, NAA 2 DAY TAT

## 2019-12-08 LAB — NOVEL CORONAVIRUS, NAA: SARS-CoV-2, NAA: NOT DETECTED

## 2019-12-08 LAB — SPECIMEN STATUS REPORT

## 2019-12-17 ENCOUNTER — Ambulatory Visit: Payer: Federal, State, Local not specified - PPO | Attending: Internal Medicine

## 2019-12-17 DIAGNOSIS — Z23 Encounter for immunization: Secondary | ICD-10-CM

## 2019-12-17 NOTE — Progress Notes (Signed)
   Covid-19 Vaccination Clinic  Name:  Vanessa Silva    MRN: [id number] DOB: [birth year]  12/17/2019  [name] was observed post Covid-19 immunization for 15 minutes without incident. She was provided with Vaccine Information Sheet and instruction to access the V-Safe system.   [name] was instructed to call 911 with any severe reactions post vaccine: Marland Kitchen Difficulty breathing  . Swelling of face and throat  . A fast heartbeat  . A bad rash all over body  . Dizziness and weakness   Immunizations Administered    Name Date Dose VIS Date Route   Pfizer COVID-19 Vaccine 12/17/2019  3:50 PM 0.3 mL 04/22/2018 Intramuscular   Manufacturer: [employer], [employer]   Lot: [id number]   NDC: [address]-0

## 2019-12-24 DIAGNOSIS — E038 Other specified hypothyroidism: Secondary | ICD-10-CM | POA: Diagnosis not present

## 2019-12-24 DIAGNOSIS — E1165 Type 2 diabetes mellitus with hyperglycemia: Secondary | ICD-10-CM | POA: Diagnosis not present

## 2019-12-24 DIAGNOSIS — E7800 Pure hypercholesterolemia, unspecified: Secondary | ICD-10-CM | POA: Diagnosis not present

## 2020-01-01 ENCOUNTER — Other Ambulatory Visit: Payer: Self-pay | Admitting: Internal Medicine

## 2020-01-04 ENCOUNTER — Other Ambulatory Visit: Payer: Self-pay | Admitting: Internal Medicine

## 2020-01-29 ENCOUNTER — Ambulatory Visit: Payer: Federal, State, Local not specified - PPO | Admitting: Internal Medicine

## 2020-02-04 DIAGNOSIS — L258 Unspecified contact dermatitis due to other agents: Secondary | ICD-10-CM | POA: Diagnosis not present

## 2020-02-04 DIAGNOSIS — Z1283 Encounter for screening for malignant neoplasm of skin: Secondary | ICD-10-CM | POA: Diagnosis not present

## 2020-02-04 DIAGNOSIS — L578 Other skin changes due to chronic exposure to nonionizing radiation: Secondary | ICD-10-CM | POA: Diagnosis not present

## 2020-02-04 DIAGNOSIS — D225 Melanocytic nevi of trunk: Secondary | ICD-10-CM | POA: Diagnosis not present

## 2020-02-04 DIAGNOSIS — L918 Other hypertrophic disorders of the skin: Secondary | ICD-10-CM | POA: Diagnosis not present

## 2020-02-10 ENCOUNTER — Encounter: Payer: Self-pay | Admitting: Internal Medicine

## 2020-02-10 ENCOUNTER — Other Ambulatory Visit: Payer: Self-pay

## 2020-02-10 ENCOUNTER — Telehealth (INDEPENDENT_AMBULATORY_CARE_PROVIDER_SITE_OTHER): Payer: Federal, State, Local not specified - PPO | Admitting: Internal Medicine

## 2020-02-10 DIAGNOSIS — I10 Essential (primary) hypertension: Secondary | ICD-10-CM | POA: Diagnosis not present
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
## 2020-02-10 NOTE — Patient Instructions (Signed)
Medication Instructions:  Your physician recommends that you continue on your current medications as directed. Please refer to the Current Medication list given to you today.  *If you need a refill on your cardiac medications before your next appointment, please call your pharmacy*   Lab Work: NONE   If you have labs (blood work) drawn today and your tests are completely normal, you will receive your results only by: . MyChart Message (if you have MyChart) OR . A paper copy in the mail If you have any lab test that is abnormal or we need to change your treatment, we will call you to review the results.   Testing/Procedures: NONE    Follow-Up: At [facility], you and your health needs are our priority.  As part of our continuing mission to provide you with exceptional heart care, we have created designated Provider Care Teams.  These Care Teams include your primary Cardiologist (physician) and Advanced Practice Providers (APPs -  Physician Assistants and Nurse Practitioners) who all work together to provide you with the care you need, when you need it.  We recommend signing up for the patient portal called "MyChart".  Sign up information is provided on this After Visit Summary.  MyChart is used to connect with patients for Virtual Visits (Telemedicine).  Patients are able to view lab/test results, encounter notes, upcoming appointments, etc.  Non-urgent messages can be sent to your provider as well.   To learn more about what you can do with MyChart, go to [email or web address].    Your next appointment:   1 year(s)  The format for your next appointment:   In Person  Provider:   Gregg Taylor, MD   Other Instructions Thank you for choosing [facility]!    

## 2020-02-15 DIAGNOSIS — I10 Essential (primary) hypertension: Secondary | ICD-10-CM | POA: Diagnosis not present

## 2020-02-15 DIAGNOSIS — E7800 Pure hypercholesterolemia, unspecified: Secondary | ICD-10-CM | POA: Diagnosis not present

## 2020-02-15 DIAGNOSIS — E1165 Type 2 diabetes mellitus with hyperglycemia: Secondary | ICD-10-CM | POA: Diagnosis not present

## 2020-02-15 DIAGNOSIS — E1140 Type 2 diabetes mellitus with diabetic neuropathy, unspecified: Secondary | ICD-10-CM | POA: Diagnosis not present

## 2020-02-24 ENCOUNTER — Ambulatory Visit: Payer: Federal, State, Local not specified - PPO | Admitting: Internal Medicine

## 2020-03-16 ENCOUNTER — Other Ambulatory Visit: Payer: Self-pay | Admitting: Internal Medicine

## 2020-03-24 ENCOUNTER — Other Ambulatory Visit: Payer: Self-pay | Admitting: Internal Medicine

## 2020-03-24 NOTE — Telephone Encounter (Signed)
This is a [place] pt.  °

## 2020-07-03 ENCOUNTER — Other Ambulatory Visit: Payer: Self-pay | Admitting: Internal Medicine

## 2020-07-03 DIAGNOSIS — I4892 Unspecified atrial flutter: Secondary | ICD-10-CM

## 2020-07-04 ENCOUNTER — Other Ambulatory Visit (HOSPITAL_COMMUNITY)
Admission: RE | Admit: 2020-07-04 | Discharge: 2020-07-04 | Disposition: A | Discharge status: Home / Self Care | Payer: Federal, State, Local not specified - PPO | Source: Ambulatory Visit | Attending: Internal Medicine | Admitting: Internal Medicine

## 2020-07-04 ENCOUNTER — Other Ambulatory Visit: Payer: Self-pay

## 2020-07-04 DIAGNOSIS — I4892 Unspecified atrial flutter: Secondary | ICD-10-CM | POA: Diagnosis not present

## 2020-07-04 LAB — CBC
HCT: 47.7 % — ABNORMAL HIGH (ref 36.0–46.0)
Hemoglobin: 15.3 g/dL — ABNORMAL HIGH (ref 12.0–15.0)
MCH: 28.7 pg (ref 26.0–34.0)
MCHC: 32.1 g/dL (ref 30.0–36.0)
MCV: 89.3 fL (ref 80.0–100.0)
Platelets: 291 10*3/uL (ref 150–400)
RBC: 5.34 MIL/uL — ABNORMAL HIGH (ref 3.87–5.11)
RDW: 12.6 % (ref 11.5–15.5)
WBC: 9.5 10*3/uL (ref 4.0–10.5)
nRBC: 0 % (ref 0.0–0.2)

## 2020-07-04 LAB — BASIC METABOLIC PANEL
Anion gap: 8 (ref 5–15)
BUN: 10 mg/dL (ref 6–20)
CO2: 28 mmol/L (ref 22–32)
Calcium: 9.1 mg/dL (ref 8.9–10.3)
Chloride: 101 mmol/L (ref 98–111)
Creatinine, Ser: 0.55 mg/dL (ref 0.44–1.00)
GFR, Estimated: >60 mL/min (ref >60)
Glucose, Bld: 233 mg/dL — ABNORMAL HIGH (ref 70–99)
Potassium: 3.8 mmol/L (ref 3.5–5.1)
Sodium: 137 mmol/L (ref 135–145)

## 2020-07-04 NOTE — Telephone Encounter (Addendum)
Eliquis 5mg  refill request received. Patient is 60 years old, weight-122.5kg, Crea-0.80 on 10/29/2018 from scanned labs at [facility], Diagnosis-Afib, and last seen by [facility]. [facility] on 02/10/2020 via Telemedicine. Dose is appropriate based on dosing criteria.  Called pt's PCP and they do not have any recent labs and pt is not pending an appt.  Called pt and she is willing to go to the lab in [place] where she leaves. Ordered labs and pt is aware to go by 3pm today and since she is almost out of her Eliquis refill sent. Will monitor for ordered labs.

## 2020-07-05 NOTE — Telephone Encounter (Signed)
Pt went on yesterday and had her labs drawn for Eliquis management. Pt is  60yrs old, wt-122.5kg, Crea-0.55 on 07/04/20, Dx-last seen by Dr. Ladona Ridgel on 02/10/2020 via Telemedicine. Refill was sent yesterday.

## 2020-08-19 ENCOUNTER — Other Ambulatory Visit: Payer: Self-pay

## 2020-08-19 ENCOUNTER — Ambulatory Visit: Payer: Federal, State, Local not specified - PPO | Attending: Internal Medicine

## 2020-08-19 ENCOUNTER — Other Ambulatory Visit (HOSPITAL_BASED_OUTPATIENT_CLINIC_OR_DEPARTMENT_OTHER): Payer: Self-pay

## 2020-08-19 DIAGNOSIS — Z23 Encounter for immunization: Secondary | ICD-10-CM

## 2020-08-19 MED ORDER — PFIZER-BIONT COVID-19 VAC-TRIS 30 MCG/0.3ML IM SUSP
INTRAMUSCULAR | 0 refills | Status: AC
  Filled 2020-08-19: qty 0.3, 1d supply, fill #0

## 2020-08-19 NOTE — Progress Notes (Signed)
   Covid-19 Vaccination Clinic  Name:  Vanessa Silva    MRN: [id number] DOB: [birth year]  08/19/2020  [name] was observed post Covid-19 immunization for 15 minutes without incident. She was provided with Vaccine Information Sheet and instruction to access the V-Safe system.   [name] was instructed to call 911 with any severe reactions post vaccine: Difficulty breathing  Swelling of face and throat  A fast heartbeat  A bad rash all over body  Dizziness and weakness   Immunizations Administered     Name Date Dose VIS Date Route   PFIZER Comrnaty(Gray TOP) Covid-19 Vaccine 08/19/2020  3:27 PM 0.3 mL 02/04/2020 Intramuscular   Manufacturer: [employer], [employer]   Lot: [id number]   NDC: [phone]

## 2020-09-08 DIAGNOSIS — E1165 Type 2 diabetes mellitus with hyperglycemia: Secondary | ICD-10-CM | POA: Diagnosis not present

## 2020-09-08 DIAGNOSIS — E1140 Type 2 diabetes mellitus with diabetic neuropathy, unspecified: Secondary | ICD-10-CM | POA: Diagnosis not present

## 2020-09-08 DIAGNOSIS — I10 Essential (primary) hypertension: Secondary | ICD-10-CM | POA: Diagnosis not present

## 2020-09-08 DIAGNOSIS — E7800 Pure hypercholesterolemia, unspecified: Secondary | ICD-10-CM | POA: Diagnosis not present

## 2020-09-23 DIAGNOSIS — I5040 Unspecified combined systolic (congestive) and diastolic (congestive) heart failure: Secondary | ICD-10-CM | POA: Diagnosis not present

## 2020-09-23 DIAGNOSIS — F419 Anxiety disorder, unspecified: Secondary | ICD-10-CM | POA: Diagnosis not present

## 2020-09-23 DIAGNOSIS — F320 Major depressive disorder, single episode, mild: Secondary | ICD-10-CM | POA: Diagnosis not present

## 2021-01-02 ENCOUNTER — Other Ambulatory Visit: Payer: Self-pay | Admitting: Internal Medicine

## 2021-01-02 NOTE — Telephone Encounter (Signed)
Prescription refill request for Eliquis received. Indication: Aflutter Last office visit:02/10/20 Vanessa Silva)  Scr: 0.55 (07/04/20) Age: 60 Weight: 122.5kg  Appropriate dose and refill sent to requested pharmacy.

## 2021-01-12 DIAGNOSIS — E1165 Type 2 diabetes mellitus with hyperglycemia: Secondary | ICD-10-CM | POA: Diagnosis not present

## 2021-01-12 DIAGNOSIS — I10 Essential (primary) hypertension: Secondary | ICD-10-CM | POA: Diagnosis not present

## 2021-01-12 DIAGNOSIS — E11319 Type 2 diabetes mellitus with unspecified diabetic retinopathy without macular edema: Secondary | ICD-10-CM | POA: Diagnosis not present

## 2021-01-12 DIAGNOSIS — E7800 Pure hypercholesterolemia, unspecified: Secondary | ICD-10-CM | POA: Diagnosis not present

## 2021-01-17 DIAGNOSIS — Z6841 Body Mass Index (BMI) 40.0 and over, adult: Secondary | ICD-10-CM | POA: Diagnosis not present

## 2021-01-17 DIAGNOSIS — H6990 Unspecified Eustachian tube disorder, unspecified ear: Secondary | ICD-10-CM | POA: Diagnosis not present

## 2021-01-17 DIAGNOSIS — R39198 Other difficulties with micturition: Secondary | ICD-10-CM | POA: Diagnosis not present

## 2021-01-17 DIAGNOSIS — Z1331 Encounter for screening for depression: Secondary | ICD-10-CM | POA: Diagnosis not present

## 2021-02-13 ENCOUNTER — Ambulatory Visit: Payer: Federal, State, Local not specified - PPO | Admitting: Internal Medicine

## 2021-03-14 ENCOUNTER — Other Ambulatory Visit: Payer: Self-pay

## 2021-03-14 ENCOUNTER — Encounter: Payer: Self-pay | Admitting: Internal Medicine

## 2021-03-14 ENCOUNTER — Encounter: Payer: Self-pay | Admitting: *Deleted

## 2021-03-14 ENCOUNTER — Ambulatory Visit (INDEPENDENT_AMBULATORY_CARE_PROVIDER_SITE_OTHER): Payer: Federal, State, Local not specified - PPO | Admitting: Internal Medicine

## 2021-03-14 VITALS — BP 118/72 | HR 71 | Ht 68.0 in | Wt 275.6 lb

## 2021-03-14 DIAGNOSIS — I10 Essential (primary) hypertension: Secondary | ICD-10-CM | POA: Diagnosis not present

## 2021-03-14 MED ORDER — ENALAPRIL MALEATE 20 MG PO TABS: 20.0000 mg | ORAL_TABLET | Freq: Every day | ORAL | 3 refills | Status: DC

## 2021-03-14 MED ORDER — ENALAPRIL MALEATE 10 MG PO TABS: 10.0000 mg | ORAL_TABLET | Freq: Every day | ORAL | 3 refills | Status: DC

## 2021-03-14 NOTE — Patient Instructions (Signed)
Medication Instructions:  Stop Taking Quinapril  Start Taking Enalapril 10 mg Daily   *If you need a refill on your cardiac medications before your next appointment, please call your pharmacy*   Lab Work: NONE   If you have labs (blood work) drawn today and your tests are completely normal, you will receive your results only by: MyChart Message (if you have MyChart) OR A paper copy in the mail If you have any lab test that is abnormal or we need to change your treatment, we will call you to review the results.   Testing/Procedures: NONE    Follow-Up: At [facility], you and your health needs are our priority.  As part of our continuing mission to provide you with exceptional heart care, we have created designated Provider Care Teams.  These Care Teams include your primary Cardiologist (physician) and Advanced Practice Providers (APPs -  Physician Assistants and Nurse Practitioners) who all work together to provide you with the care you need, when you need it.  We recommend signing up for the patient portal called "MyChart".  Sign up information is provided on this After Visit Summary.  MyChart is used to connect with patients for Virtual Visits (Telemedicine).  Patients are able to view lab/test results, encounter notes, upcoming appointments, etc.  Non-urgent messages can be sent to your provider as well.   To learn more about what you can do with MyChart, go to [email or web address].    Your next appointment:   1 year(s)  The format for your next appointment:   In Person  Provider:   Lewayne Bunting, MD    Other Instructions Thank you for choosing [facility]!
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
## 2021-05-02 ENCOUNTER — Other Ambulatory Visit: Payer: Self-pay | Admitting: Internal Medicine

## 2021-06-01 ENCOUNTER — Other Ambulatory Visit: Payer: Self-pay | Admitting: Internal Medicine

## 2021-06-20 DIAGNOSIS — K029 Dental caries, unspecified: Secondary | ICD-10-CM | POA: Diagnosis not present

## 2021-06-22 ENCOUNTER — Telehealth: Payer: Self-pay | Admitting: *Deleted

## 2021-06-22 ENCOUNTER — Telehealth: Payer: Self-pay | Admitting: Cardiology

## 2021-06-22 ENCOUNTER — Telehealth: Payer: Self-pay

## 2021-06-22 NOTE — Telephone Encounter (Signed)
Patient with diagnosis of afib on Eliquis for anticoagulation.   ? ?Procedure: 3 dental extractions ?Date of procedure: TBD ? ?CHA2DS2-VASc Score = 7  ?his indicates a 11.2% annual risk of stroke. ?The patient's score is based upon: ?CHF History: 1 ?HTN History: 1 ?Diabetes History: 1 ?Stroke History: 2 ?Vascular Disease History: 1 ?Age Score: 0 ?Gender Score: 1 ?  ?CrCl >131mL/min ?Platelet count 291K ? ?Patient does not require pre-op antibiotics for dental procedure. ? ?Per office protocol, patient can hold Eliquis for 1 day prior to procedure. She should resume as soon as safely possible after given her elevated CV risk. ?

## 2021-06-22 NOTE — Telephone Encounter (Signed)
Tele pre op appt 06/23/21 @ 2 pm. Med rec and consent are done. Pt does state Dr. Domenic Polite recently changed her medication over to Enalapril. Pt states she she is not sure if some of the symptoms she has been having is coming from the Enalapril. Pt states she has had some dizziness and feeling unbalanced. I advised the pt to be sure to d/w pre op provider tomorrow as well. The provider will be able to let Dr. Domenic Polite  know . Pt thanked me for the call and the help.  ? ?  ?Patient Consent for Virtual Visit  ? ? ?   ? ?Vanessa Silva has provided verbal consent on 06/22/2021 for a virtual visit (video or telephone). ? ? ?CONSENT FOR VIRTUAL VISIT FOR:  Vanessa Silva  ?By participating in this virtual visit I agree to the following: ? ?I hereby voluntarily request, consent and authorize [facility] and its employed or contracted physicians, physician assistants, nurse practitioners or other licensed health care professionals (the Practitioner), to provide me with telemedicine health care services (the ?Services") as deemed necessary by the treating Practitioner. I acknowledge and consent to receive the Services by the Practitioner via telemedicine. I understand that the telemedicine visit will involve communicating with the Practitioner through live audiovisual communication technology and the disclosure of certain medical information by electronic transmission. I acknowledge that I have been given the opportunity to request an in-person assessment or other available alternative prior to the telemedicine visit and am voluntarily participating in the telemedicine visit. ? ?I understand that I have the right to withhold or withdraw my consent to the use of telemedicine in the course of my care at any time, without affecting my right to future care or treatment, and that the Practitioner or I may terminate the telemedicine visit at any time. I understand that I have the right to inspect all information obtained  and/or recorded in the course of the telemedicine visit and may receive copies of available information for a reasonable fee.  I understand that some of the potential risks of receiving the Services via telemedicine include:  ?Delay or interruption in medical evaluation due to technological equipment failure or disruption; ?Information transmitted may not be sufficient (e.g. poor resolution of images) to allow for appropriate medical decision making by the Practitioner; and/or  ?In rare instances, security protocols could fail, causing a breach of personal health information. ? ?Furthermore, I acknowledge that it is my responsibility to provide information about my medical history, conditions and care that is complete and accurate to the best of my ability. I acknowledge that Practitioner's advice, recommendations, and/or decision may be based on factors not within their control, such as incomplete or inaccurate data provided by me or distortions of diagnostic images or specimens that may result from electronic transmissions. I understand that the practice of medicine is not an exact science and that Practitioner makes no warranties or guarantees regarding treatment outcomes. I acknowledge that a copy of this consent can be made available to me via my patient portal ([facility]), or I can request a printed copy by calling the office of [facility].   ? ?I understand that my insurance will be billed for this visit.  ? ?I have read or had this consent read to me. ?I understand the contents of this consent, which adequately explains the benefits and risks of the Services being provided via telemedicine.  ?I have been provided ample opportunity to ask questions regarding  this consent and the Services and have had my questions answered to my satisfaction. ?I give my informed consent for the services to be provided through the use of telemedicine in my medical care ? ? ? ?

## 2021-06-22 NOTE — Telephone Encounter (Signed)
Tele pre op appt 06/23/21 @ 2 pm. Med rec and consent are done. Pt does state Dr. Domenic Polite recently changed her medication over to Enalapril. Pt states she she is not sure if some of the symptoms she has been having is coming from the Enalapril. Pt states she has had some dizziness and feeling unbalanced. I advised the pt to be sure to d/w pre op provider tomorrow as well. The provider will be able to let Dr. Domenic Polite  know . Pt thanked me for the call and the help.  ?

## 2021-06-22 NOTE — Telephone Encounter (Signed)
? ? ?  Name: Vanessa Silva  ?DOB: 1960/05/29  ?MRN: [id number] ? ?Primary Cardiologist: None ? ? ?Preoperative team, please contact this patient and set up a phone call appointment for further preoperative risk assessment. Please obtain consent and complete medication review. Thank you for your help. ? ?I confirm that guidance regarding antiplatelet and oral anticoagulation therapy has been completed and, if necessary, noted below. ? ? ? ?Eula Listen, PA-C ?06/22/2021, 3:54 PM ?[facility] ?[address] ?[place], [place] [address] ? ? ?

## 2021-06-22 NOTE — Telephone Encounter (Signed)
Request to hold Eliquis for 3 dental extractions.  ?

## 2021-06-22 NOTE — Telephone Encounter (Signed)
? ?  Pre-operative Risk Assessment  ?  ?Patient Name: Vanessa Silva  ?DOB: 1961/01/17 ?MRN: [id number]  ? ? ? ?Request for Surgical Clearance   ? ?Procedure:  Dental Extraction - Amount of Teeth to be Pulled:  3 ? ?Date of Surgery:  Clearance TBD                              ?   ?Surgeon:  NONE LISTED ?Surgeon's Group or Practice Name:  [facility]  ?Phone number:  [phone] ?Fax number:  [phone] ?  ?Type of Clearance Requested:   ?- Pharmacy:  Hold Aspirin and Apixaban (Eliquis) NEEDS INSTRUCTIONS ?  ?Type of Anesthesia:  Local  OR VERSED AND FENTANYL ?  ?Additional requests/questions:   ? ?Signed, ?Jacinta Shoe   ?06/22/2021, 3:12 PM   ?

## 2021-06-23 ENCOUNTER — Ambulatory Visit (INDEPENDENT_AMBULATORY_CARE_PROVIDER_SITE_OTHER): Payer: Federal, State, Local not specified - PPO | Admitting: Physician Assistant

## 2021-06-23 DIAGNOSIS — Z01810 Encounter for preprocedural cardiovascular examination: Secondary | ICD-10-CM | POA: Diagnosis not present
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
## 2021-06-28 DIAGNOSIS — E1165 Type 2 diabetes mellitus with hyperglycemia: Secondary | ICD-10-CM | POA: Diagnosis not present

## 2021-06-28 DIAGNOSIS — E038 Other specified hypothyroidism: Secondary | ICD-10-CM | POA: Diagnosis not present

## 2021-06-28 DIAGNOSIS — E7800 Pure hypercholesterolemia, unspecified: Secondary | ICD-10-CM | POA: Diagnosis not present

## 2021-06-29 DIAGNOSIS — H6990 Unspecified Eustachian tube disorder, unspecified ear: Secondary | ICD-10-CM | POA: Diagnosis not present

## 2021-06-29 DIAGNOSIS — Z91199 Patient's noncompliance with other medical treatment and regimen due to unspecified reason: Secondary | ICD-10-CM | POA: Diagnosis not present

## 2021-06-29 DIAGNOSIS — F419 Anxiety disorder, unspecified: Secondary | ICD-10-CM | POA: Diagnosis not present

## 2021-06-29 DIAGNOSIS — E109 Type 1 diabetes mellitus without complications: Secondary | ICD-10-CM | POA: Diagnosis not present

## 2021-06-29 DIAGNOSIS — F320 Major depressive disorder, single episode, mild: Secondary | ICD-10-CM | POA: Diagnosis not present

## 2021-06-29 DIAGNOSIS — Z6841 Body Mass Index (BMI) 40.0 and over, adult: Secondary | ICD-10-CM | POA: Diagnosis not present

## 2021-06-29 DIAGNOSIS — R39198 Other difficulties with micturition: Secondary | ICD-10-CM | POA: Diagnosis not present

## 2021-06-29 DIAGNOSIS — I5040 Unspecified combined systolic (congestive) and diastolic (congestive) heart failure: Secondary | ICD-10-CM | POA: Diagnosis not present

## 2021-06-29 DIAGNOSIS — M797 Fibromyalgia: Secondary | ICD-10-CM | POA: Diagnosis not present

## 2021-07-02 ENCOUNTER — Other Ambulatory Visit: Payer: Self-pay | Admitting: Internal Medicine

## 2021-07-03 NOTE — Telephone Encounter (Addendum)
Eliquis 5mg  refill request received. Patient is 61 years old, weight-125kg, Crea-0.55 on 07/04/2020, Diagnosis-Afib, and last seen by 09/03/2020 on 06/23/2021. Dose is appropriate based on dosing criteria.  ? ?Pt needs updated labs. ? ?Called pt to inquire about labs and pt stated she had some done recently last week with Dr. 06/25/2021 at [facility]. She gave me the number [phone] to call them regarding this.  ? ?Called [facility] and inquired about recent labs; had to leave a message for 06-04-1981 in Medical Records to call back or fax over labs for the pt. Will await a callback or response. ? ?Called back and they will get the labs faxed over to [phone]; will await.. ? ?Labs received via [employer] and Crea-0.61 on 09/01/2020; will send in refill to requested pharmacy.  ?

## 2021-07-04 DIAGNOSIS — I10 Essential (primary) hypertension: Secondary | ICD-10-CM | POA: Diagnosis not present

## 2021-07-04 DIAGNOSIS — E1140 Type 2 diabetes mellitus with diabetic neuropathy, unspecified: Secondary | ICD-10-CM | POA: Diagnosis not present

## 2021-07-04 DIAGNOSIS — E7800 Pure hypercholesterolemia, unspecified: Secondary | ICD-10-CM | POA: Diagnosis not present

## 2021-07-04 DIAGNOSIS — E1165 Type 2 diabetes mellitus with hyperglycemia: Secondary | ICD-10-CM | POA: Diagnosis not present

## 2021-09-05 DIAGNOSIS — E1140 Type 2 diabetes mellitus with diabetic neuropathy, unspecified: Secondary | ICD-10-CM | POA: Diagnosis not present

## 2021-09-05 DIAGNOSIS — E7800 Pure hypercholesterolemia, unspecified: Secondary | ICD-10-CM | POA: Diagnosis not present

## 2021-09-05 DIAGNOSIS — E038 Other specified hypothyroidism: Secondary | ICD-10-CM | POA: Diagnosis not present

## 2021-09-14 ENCOUNTER — Telehealth: Payer: Self-pay | Admitting: *Deleted

## 2021-09-14 NOTE — Chronic Care Management (AMB) (Signed)
  Care Management   Outreach Note  09/14/2021 Name: Vanessa Silva MRN: [id number] DOB: 08/11/1960  Referred by: Avis Epley, PA-C Reason for referral : Care Coordination (Initial outreach to schedule with clinician from segmented list [facility])   An unsuccessful telephone outreach was attempted today. The patient was referred to the case management team for assistance with care management and care coordination.   Follow Up Plan:  A HIPAA compliant phone message was left for the patient providing contact information and requesting a return call.  The care management team will reach out to the patient again over the next 7 days.  If patient returns call to provider office, please advise to call Embedded Care Management Care Guide Misty Stanley* at [phone].Misty Stanley [facility]  Care Coordination Care Guide  Direct Dial: [phone]

## 2021-09-20 NOTE — Chronic Care Management (AMB) (Signed)
  Care Coordination  Note  09/20/2021 Name: Vanessa Silva MRN: [id number] DOB: 1960-12-02  Vanessa Silva is a 61 y.o. year old female who is a primary care patient of Ladon Applebaum. I reached out to Myer Haff by phone today to offer care coordination services.      Vanessa Silva was given information about Care Coordination services today including:  The Care Coordination services include support from the care team which includes your Nurse Coordinator, Clinical Social Worker, or Pharmacist.  The Care Coordination team is here to help remove barriers to the health concerns and goals most important to you. Care Coordination services are voluntary and the patient may decline or stop services at any time by request to their care team member.   Patient wishes to consider information provided and/or speak with a member of the care team before deciding to participate in care coordination services.     [facility]  Care Coordination Care Guide  Direct Dial: [phone]

## 2021-09-27 NOTE — Chronic Care Management (AMB) (Signed)
  Care Coordination  Outreach Note  09/27/2021 Name: Vanessa Silva MRN: [id number] DOB: Dec 23, 1960   Care Coordination Outreach Attempts  A third unsuccessful outreach was attempted today to offer the patient with information about available care coordination services as a benefit of their health plan.   Follow Up Plan:  No further outreach attempts will be made at this time. We have been unable to contact the patient to offer or enroll patient in care coordination services  Encounter Outcome:  Pt. Refused  [facility] Coordination Care Guide  Direct Dial: [phone]

## 2021-11-07 DIAGNOSIS — E7800 Pure hypercholesterolemia, unspecified: Secondary | ICD-10-CM | POA: Diagnosis not present

## 2021-11-07 DIAGNOSIS — E1140 Type 2 diabetes mellitus with diabetic neuropathy, unspecified: Secondary | ICD-10-CM | POA: Diagnosis not present

## 2021-11-07 DIAGNOSIS — I10 Essential (primary) hypertension: Secondary | ICD-10-CM | POA: Diagnosis not present

## 2021-11-07 DIAGNOSIS — E1165 Type 2 diabetes mellitus with hyperglycemia: Secondary | ICD-10-CM | POA: Diagnosis not present

## 2021-11-20 DIAGNOSIS — E113553 Type 2 diabetes mellitus with stable proliferative diabetic retinopathy, bilateral: Secondary | ICD-10-CM | POA: Diagnosis not present

## 2021-11-20 DIAGNOSIS — H25813 Combined forms of age-related cataract, bilateral: Secondary | ICD-10-CM | POA: Diagnosis not present

## 2021-12-07 ENCOUNTER — Other Ambulatory Visit (HOSPITAL_BASED_OUTPATIENT_CLINIC_OR_DEPARTMENT_OTHER): Payer: Self-pay

## 2021-12-07 DIAGNOSIS — H25813 Combined forms of age-related cataract, bilateral: Secondary | ICD-10-CM | POA: Diagnosis not present

## 2021-12-07 DIAGNOSIS — H4312 Vitreous hemorrhage, left eye: Secondary | ICD-10-CM | POA: Diagnosis not present

## 2021-12-07 DIAGNOSIS — E113553 Type 2 diabetes mellitus with stable proliferative diabetic retinopathy, bilateral: Secondary | ICD-10-CM | POA: Diagnosis not present

## 2021-12-07 MED ORDER — COVID-19 MRNA 2023-2024 VACCINE (COMIRNATY) 0.3 ML INJECTION
INTRAMUSCULAR | 0 refills | Status: AC
  Filled 2021-12-07: qty 0.3, 1d supply, fill #0

## 2021-12-07 MED ORDER — INFLUENZA VAC SPLIT QUAD 0.5 ML IM SUSY
PREFILLED_SYRINGE | INTRAMUSCULAR | 0 refills | Status: AC
  Filled 2021-12-07: qty 0.5, 1d supply, fill #0

## 2022-01-04 DIAGNOSIS — F419 Anxiety disorder, unspecified: Secondary | ICD-10-CM | POA: Diagnosis not present

## 2022-01-04 DIAGNOSIS — M797 Fibromyalgia: Secondary | ICD-10-CM | POA: Diagnosis not present

## 2022-01-04 DIAGNOSIS — F320 Major depressive disorder, single episode, mild: Secondary | ICD-10-CM | POA: Diagnosis not present

## 2022-01-07 ENCOUNTER — Other Ambulatory Visit: Payer: Self-pay | Admitting: Internal Medicine

## 2022-01-07 DIAGNOSIS — I4891 Unspecified atrial fibrillation: Secondary | ICD-10-CM

## 2022-01-08 NOTE — Telephone Encounter (Signed)
Eliquis 5mg  refill request received. Patient is 61 years old, weight-125kg, Crea-0.64 on 06/29/2021, Diagnosis-Afib, and last seen by 08/29/2021 on 06/23/2021. Dose is appropriate based on dosing criteria. Will send in refill to requested pharmacy.

## 2022-01-15 DIAGNOSIS — H25813 Combined forms of age-related cataract, bilateral: Secondary | ICD-10-CM | POA: Diagnosis not present

## 2022-01-23 DIAGNOSIS — H25812 Combined forms of age-related cataract, left eye: Secondary | ICD-10-CM | POA: Diagnosis not present

## 2022-02-08 DIAGNOSIS — H25811 Combined forms of age-related cataract, right eye: Secondary | ICD-10-CM | POA: Diagnosis not present

## 2022-02-15 ENCOUNTER — Other Ambulatory Visit: Payer: Self-pay | Admitting: Internal Medicine

## 2022-03-13 ENCOUNTER — Other Ambulatory Visit: Payer: Self-pay | Admitting: Internal Medicine

## 2022-04-22 DIAGNOSIS — I5042 Chronic combined systolic (congestive) and diastolic (congestive) heart failure: Secondary | ICD-10-CM | POA: Insufficient documentation

## 2022-04-22 DIAGNOSIS — H1132 Conjunctival hemorrhage, left eye: Secondary | ICD-10-CM | POA: Diagnosis not present

## 2022-04-22 DIAGNOSIS — I110 Hypertensive heart disease with heart failure: Secondary | ICD-10-CM | POA: Diagnosis not present

## 2022-04-22 DIAGNOSIS — E119 Type 2 diabetes mellitus without complications: Secondary | ICD-10-CM | POA: Insufficient documentation

## 2022-04-22 DIAGNOSIS — I2510 Atherosclerotic heart disease of native coronary artery without angina pectoris: Secondary | ICD-10-CM | POA: Insufficient documentation

## 2022-04-22 DIAGNOSIS — H5712 Ocular pain, left eye: Secondary | ICD-10-CM | POA: Diagnosis not present

## 2022-04-22 NOTE — ED Triage Notes (Signed)
Pt c/o left eye pain that started tonight around 9pm. Pt has blood around sclera. Reports she is seeing more "floaters" than usual. Denies any known injury.

## 2022-04-22 NOTE — ED Triage Notes (Incomplete)
Pt c/o left eye pain that started tonight around 9pm. Pt has blood around sclera. Reports she is seeing more "floaters" than usual. Denies any known injury.

## 2022-04-23 ENCOUNTER — Emergency Department (HOSPITAL_COMMUNITY)
Admission: EM | Admit: 2022-04-23 | Discharge: 2022-04-23 | Disposition: A | Discharge status: Home / Self Care | Payer: Federal, State, Local not specified - PPO | Attending: Emergency Medicine | Admitting: Emergency Medicine

## 2022-04-23 ENCOUNTER — Other Ambulatory Visit: Payer: Self-pay

## 2022-04-23 DIAGNOSIS — H1132 Conjunctival hemorrhage, left eye: Secondary | ICD-10-CM

## 2022-04-23 MED ORDER — FLUORESCEIN SODIUM 1 MG OP STRP
1.0000 | ORAL_STRIP | Freq: Once | OPHTHALMIC | Status: AC
  Administered 2022-04-23: 1 via OPHTHALMIC
  Filled 2022-04-23: qty 1

## 2022-04-23 MED ORDER — TETRACAINE HCL 0.5 % OP SOLN
2.0000 [drp] | Freq: Once | OPHTHALMIC | Status: AC
  Administered 2022-04-23: 2 [drp] via OPHTHALMIC
  Filled 2022-04-23: qty 4

## 2022-04-23 NOTE — ED Notes (Signed)
ED Provider at bedside. 

## 2022-04-23 NOTE — Discharge Instructions (Signed)
You were evaluated in the Emergency Department and after careful evaluation, we did not find any emergent condition requiring admission or further testing in the hospital.  Your exam/testing today is overall reassuring.  Symptoms likely due to symptomatic subsequent toe hematoma.  You also have a small corneal abrasion.  Use the moxifloxacin drops 4 times a day over the next 5 days.  Follow-up closely with your ophthalmologist.  Please return to the Emergency Department if you experience any worsening of your condition.   Thank you for allowing [place] to be a part of your care.

## 2022-04-23 NOTE — ED Notes (Signed)
Conjunctival hematoma present to left eye. Denies changes in vision. Bryson Corona Edd Fabian
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
## 2022-04-25 DIAGNOSIS — H1132 Conjunctival hemorrhage, left eye: Secondary | ICD-10-CM | POA: Diagnosis not present

## 2022-05-15 ENCOUNTER — Ambulatory Visit
Admission: EM | Admit: 2022-05-15 | Discharge: 2022-05-15 | Disposition: A | Discharge status: Home / Self Care | Payer: Self-pay | Attending: Family Medicine | Admitting: Family Medicine

## 2022-05-15 DIAGNOSIS — S61411A Laceration without foreign body of right hand, initial encounter: Secondary | ICD-10-CM

## 2022-05-15 MED ORDER — CEPHALEXIN 500 MG PO CAPS: 500.0000 mg | ORAL_CAPSULE | Freq: Two times a day (BID) | ORAL | 0 refills | Status: DC

## 2022-05-15 MED ORDER — MUPIROCIN 2 % EX OINT: 1.0000 | TOPICAL_OINTMENT | Freq: Two times a day (BID) | CUTANEOUS | 0 refills | Status: AC

## 2022-05-15 NOTE — ED Triage Notes (Addendum)
Pt reports she hurt her right hand and has sutures that [facility] uc placed sutures 3/14. Pts right hand is now bloody and it appears as though some of her stitches may have came out. She also has an internal stitch. 15 total 14 external 1 internal suture. Pt attempted to clean her hand with alcohol. Pt complains hand is painful, swollen, and may be infected.
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
## 2022-05-15 NOTE — ED Notes (Signed)
Ointment applied to site. Nonadherent dressing applied to hand and between little finger and ring finger on right hand. Coban secured dressing. Pt tolerated well.   Site management and infection prevention education reviewed. Pt verbalized understanding.

## 2022-05-15 NOTE — Discharge Instructions (Signed)
Clean with mild soap and water, apply mupirocin ointment and a nonstick dressing at least once daily.  You may use Telfa pads, otherwise called nonstick gauze pads and Coban wrap around the hand for your dressings.  Elevate the hand at rest.  Follow-up for worsening symptoms.

## 2022-05-23 ENCOUNTER — Ambulatory Visit: Payer: Federal, State, Local not specified - PPO

## 2022-06-15 DIAGNOSIS — F320 Major depressive disorder, single episode, mild: Secondary | ICD-10-CM | POA: Diagnosis not present

## 2022-06-15 DIAGNOSIS — F4321 Adjustment disorder with depressed mood: Secondary | ICD-10-CM | POA: Diagnosis not present

## 2022-06-15 DIAGNOSIS — F419 Anxiety disorder, unspecified: Secondary | ICD-10-CM | POA: Diagnosis not present

## 2022-06-29 ENCOUNTER — Other Ambulatory Visit: Payer: Self-pay | Admitting: Internal Medicine

## 2022-06-29 DIAGNOSIS — S61411A Laceration without foreign body of right hand, initial encounter: Secondary | ICD-10-CM | POA: Diagnosis not present

## 2022-06-29 DIAGNOSIS — S61411S Laceration without foreign body of right hand, sequela: Secondary | ICD-10-CM | POA: Diagnosis not present

## 2022-07-03 DIAGNOSIS — E038 Other specified hypothyroidism: Secondary | ICD-10-CM | POA: Diagnosis not present

## 2022-07-03 DIAGNOSIS — E7800 Pure hypercholesterolemia, unspecified: Secondary | ICD-10-CM | POA: Diagnosis not present

## 2022-07-03 DIAGNOSIS — I10 Essential (primary) hypertension: Secondary | ICD-10-CM | POA: Diagnosis not present

## 2022-07-03 DIAGNOSIS — E1140 Type 2 diabetes mellitus with diabetic neuropathy, unspecified: Secondary | ICD-10-CM | POA: Diagnosis not present

## 2022-07-03 DIAGNOSIS — E1165 Type 2 diabetes mellitus with hyperglycemia: Secondary | ICD-10-CM | POA: Diagnosis not present

## 2022-07-18 DIAGNOSIS — F4321 Adjustment disorder with depressed mood: Secondary | ICD-10-CM | POA: Diagnosis not present

## 2022-07-18 DIAGNOSIS — Z6839 Body mass index (BMI) 39.0-39.9, adult: Secondary | ICD-10-CM | POA: Diagnosis not present

## 2022-07-18 DIAGNOSIS — B351 Tinea unguium: Secondary | ICD-10-CM | POA: Diagnosis not present

## 2022-07-18 DIAGNOSIS — F320 Major depressive disorder, single episode, mild: Secondary | ICD-10-CM | POA: Diagnosis not present

## 2022-07-18 DIAGNOSIS — Z20828 Contact with and (suspected) exposure to other viral communicable diseases: Secondary | ICD-10-CM | POA: Diagnosis not present

## 2022-07-18 DIAGNOSIS — F419 Anxiety disorder, unspecified: Secondary | ICD-10-CM | POA: Diagnosis not present

## 2022-07-31 ENCOUNTER — Other Ambulatory Visit: Payer: Self-pay | Admitting: Cardiology

## 2022-08-01 ENCOUNTER — Telehealth: Payer: Self-pay | Admitting: Internal Medicine

## 2022-08-01 DIAGNOSIS — I4891 Unspecified atrial fibrillation: Secondary | ICD-10-CM

## 2022-08-01 MED ORDER — APIXABAN 5 MG PO TABS: 5.0000 mg | ORAL_TABLET | Freq: Two times a day (BID) | ORAL | 1 refills | Status: DC

## 2022-08-01 NOTE — Telephone Encounter (Signed)
Prescription refill request for Eliquis received. Indication: PAF Last office visit: 06/23/21  Rosette Reveal MD Scr: 0.66 on 06/29/22  Epic Age: 62 Weight: 125kg  Based on above findings Eliquis 5mg  twice daily is the appropriate dose.  Refill approved.

## 2022-08-01 NOTE — Telephone Encounter (Signed)
*  STAT* If patient is at the pharmacy, call can be transferred to refill team.   1. Which medications need to be refilled? (please list name of each medication and dose if known) ELIQUIS 5 MG TABS tablet   2. Which pharmacy/location (including street and city if local pharmacy) is medication to be sent to?  [id number] - [place], [place] - [address]      3. Do they need a 30 day or 90 day supply? 90 day

## 2022-08-06 ENCOUNTER — Telehealth: Payer: Self-pay | Admitting: Internal Medicine

## 2022-08-06 DIAGNOSIS — I4891 Unspecified atrial fibrillation: Secondary | ICD-10-CM

## 2022-08-06 MED ORDER — APIXABAN 5 MG PO TABS: 5.0000 mg | ORAL_TABLET | Freq: Two times a day (BID) | ORAL | 1 refills | Status: DC

## 2022-08-06 NOTE — Telephone Encounter (Signed)
Prescription refill request for Eliquis received. Indication: A Flutter Last office visit: 06/23/21  R Dunn PA-C  (OV 8/24) Scr: 0.66 on 06/29/22  Epic Age: 62 Weight: Not documented  Based on above findings Eliquis 5mg  twice daily is the appropriate dose.  Refill approved.

## 2022-08-06 NOTE — Telephone Encounter (Signed)
Pt c/o medication issue:  1. Name of Medication: apixaban (ELIQUIS) 5 MG TABS tablet   2. How are you currently taking this medication (dosage and times per day)? As prescribed   3. Are you having a reaction (difficulty breathing--STAT)? No   4. What is your medication issue?  Needing 90 day supply sent to this pharmacy instead of where it was originally sent.   [facility] - [place], [place] - [facility] AT Portal to [employer]

## 2022-08-21 ENCOUNTER — Encounter: Payer: Self-pay | Admitting: Physician Assistant

## 2022-08-21 ENCOUNTER — Ambulatory Visit: Payer: Federal, State, Local not specified - PPO | Attending: Physician Assistant | Admitting: Physician Assistant

## 2022-08-21 VITALS — BP 142/76 | HR 65 | Ht 68.0 in | Wt 265.6 lb

## 2022-08-21 DIAGNOSIS — Z8673 Personal history of transient ischemic attack (TIA), and cerebral infarction without residual deficits: Secondary | ICD-10-CM

## 2022-08-21 DIAGNOSIS — R079 Chest pain, unspecified: Secondary | ICD-10-CM

## 2022-08-21 DIAGNOSIS — I10 Essential (primary) hypertension: Secondary | ICD-10-CM

## 2022-08-21 DIAGNOSIS — I471 Supraventricular tachycardia, unspecified: Secondary | ICD-10-CM

## 2022-08-21 DIAGNOSIS — I5042 Chronic combined systolic (congestive) and diastolic (congestive) heart failure: Secondary | ICD-10-CM

## 2022-08-21 DIAGNOSIS — I2510 Atherosclerotic heart disease of native coronary artery without angina pectoris: Secondary | ICD-10-CM

## 2022-08-21 DIAGNOSIS — I4891 Unspecified atrial fibrillation: Secondary | ICD-10-CM

## 2022-08-21 NOTE — Patient Instructions (Signed)
Medication Instructions:  Your physician recommends that you continue on your current medications as directed. Please refer to the Current Medication list given to you today.  *If you need a refill on your cardiac medications before your next appointment, please call your pharmacy*   Lab Work: When you have your Lexiscan/Echo: -CMET -Lipid- Fasting- nothing to eat/drink 6 hours prior to lab work -TSH  If you have labs (blood work) drawn today and your tests are completely normal, you will receive your results only by: MyChart Message (if you have MyChart) OR A paper copy in the mail If you have any lab test that is abnormal or we need to change your treatment, we will call you to review the results.   Testing/Procedures: Your physician has requested that you have an echocardiogram. Echocardiography is a painless test that uses sound waves to create images of your heart. It provides your doctor with information about the size and shape of your heart and how well your heart's chambers and valves are working. This procedure takes approximately one hour. There are no restrictions for this procedure. Please do NOT wear cologne, perfume, aftershave, or lotions (deodorant is allowed). Please arrive 15 minutes prior to your appointment time.  Your physician has requested that you have a lexiscan myoview. For further information please visit [email or web address] Please follow instruction sheet, as given.    Follow-Up: At [facility], you and your health needs are our priority.  As part of our continuing mission to provide you with exceptional heart care, we have created designated Provider Care Teams.  These Care Teams include your primary Cardiologist (physician) and Advanced Practice Providers (APPs -  Physician Assistants and Nurse Practitioners) who all work together to provide you with the care you need, when you need it.  We recommend signing up for the patient portal called  "MyChart".  Sign up information is provided on this After Visit Summary.  MyChart is used to connect with patients for Virtual Visits (Telemedicine).  Patients are able to view lab/test results, encounter notes, upcoming appointments, etc.  Non-urgent messages can be sent to your provider as well.   To learn more about what you can do with MyChart, go to [email or web address].    Your next appointment:   1 year(s)  Provider:   Dina Rich, MD and Lewayne Bunting, MD   Other Instructions

## 2022-08-21 NOTE — Progress Notes (Signed)
Cardiology Office Note:  .   Date:  08/21/2022  ID:  Vanessa Silva, DOB Feb 18, 1961, MRN [id number] PCP: Ladon Applebaum  [facility] Providers Cardiologist:  Dina Rich, MD Electrophysiologist:  Lewayne Bunting, MD    History of Present Illness: Marland Kitchen   Vanessa Silva is a 62 y.o. female with history of HTN, HLD, NICM recovered LVEF, PAF on eliquis, obestiy.  Patient comes in for f/u. Her husband died in 03/11/22. She feels like her heart gets heavy and her life is being sucked out of her. She cries and prays. Doesn't get short of breath. Occurs at rest or exertion and thinking. Can last all morning or afternoon. She just wants to go to bed. Her anxiety/depression meds have just been changed. She was married 41 yrs and has so much sorrow. She is going through counseling. No regular exercise. Starting to walk her dog. Has lost 20 lbs unintentionally but now started on ozempic for additional weight loss.     ROS:    Studies Reviewed: Marland Kitchen        Prior CV Studies:    05/2013 echo Study Conclusions  - Left ventricle: The cavity size was mildly dilated. Wall   thickness was increased in a pattern of moderate LVH.   Systolic function was moderately to severely reduced. The   estimated ejection fraction was 35%, in the range of 30%   to 35%. Diffuse hypokinesis. Doppler parameters are   consistent with restrictive physiology, indicative of   decreased left ventricular diastolic compliance and/or   increased left atrial pressure. - Mitral valve: Calcified annulus. Mild regurgitation. - Left atrium: The atrium was moderately dilated.     05/2013 Cath HEMODYNAMICS:  Aortic pressure 116/75 mmHg; LV pressure 120/7 mmHg; LVEDP 19 mm mercury; RA 8 mm mercury; RV 32/8 mmHg; PA 32/20 mmHg; PCWP(mean) 14 mm mercury; Cardiac Output 5. 59 L per minute by thermal dilution and 6.21 L per minute by Fick; AV gradient 0.   ANGIOGRAPHIC DATA:   The left main coronary artery is widely  patent.   The left anterior descending artery is widely patent. The proximal vessel contains luminal irregularities with up to 30% narrowing.   The left circumflex artery is widely patent.   The right coronary artery is widely patent.   LEFT VENTRICULOGRAM:  Left ventricular angiogram was done in the 30 RAO projection and revealed a mildly dilated left ventricular cavity with global hypokinesis and an estimated ejection fraction of 35%.     IMPRESSIONS:  1. No significant obstructive coronary disease. Luminal irregularities are noted in the proximal to mid LAD.   2. Mildly dilated and moderately to severely hypocontractile left ventricle with an estimated ejection fraction of 35%   3. Normal right heart pressures     RECOMMENDATION:  Medical therapy to include beta blocker, angiotensin/renin blockade, diuretic therapy, as well as lipid and diabetes management.   04/2014 Echo Study Conclusions  - Left ventricle: The cavity size was normal. There was mild   concentric hypertrophy. Systolic function was normal. The   estimated ejection fraction was in the range of 50% to 55%. Wall   motion was normal; there were no regional wall motion   abnormalities. Doppler parameters are consistent with abnormal   left ventricular relaxation (grade 1 diastolic dysfunction).   Doppler parameters are consistent with high ventricular filling   pressure. Medial E/e&' 16.3. - Mitral valve: Mildly calcified annulus. Mildly thickened leaflets   . - Left  atrium: The atrium was mildly dilated. Volume/bsa, S: 28.9   ml/m^2.  Impressions:  - When compared to the report dated 05/31/13, LV systolic function   has normalized.     EKG:NSR with old septal infarct Risk Assessment/Calculations:    CHA2DS2-VASc Score = 3   This indicates a 3.2% annual risk of stroke. The patient's score is based upon: CHF History: 1 HTN History: 1 Diabetes History: 0 Stroke History: 0 Vascular Disease History: 0 Age  Score: 0 Gender Score: 1         Physical Exam:   VS:  BP (!) 142/76   Pulse 65   Ht 5\' 8"  (1.727 m)   Wt 265 lb 9.6 oz (120.5 kg)   LMP 06/30/2012   SpO2 98%   BMI 40.38 kg/m    Wt Readings from Last 3 Encounters:  08/21/22 265 lb 9.6 oz (120.5 kg)  03/14/21 275 lb 9.6 oz (125 kg)  02/10/20 270 lb (122.5 kg)    GEN: Obese, in no acute distress NECK: No JVD; No carotid bruits CARDIAC:  RRR, no murmurs, rubs, gallops RESPIRATORY:  Clear to auscultation without rales, wheezing or rhonchi  ABDOMEN: Soft, non-tender, non-distended EXTREMITIES:  No edema; No deformity   ASSESSMENT AND PLAN: .    Chest heaviness and feeling of emptiness since her husband died. Can last for hours. In grief counseling and depression meds just changed. With history of NICM will check echo and lexiscan.  PAF on eliquis followed by Dr. Lenon Oms recent symptoms or bleeding on eliquis. Check surveillance labs  History of cryptogenic stroke to remain on eliquis  SVT s/p AVNRT ablation-no symptoms  CAD nonobstructive on cath 2015  Chronic combined systolic and diastolic CHF EF 40% 2015 improved 60-65% echo 2017-compensated. Update echo  HTN controlled  Obesity 150 min exercise weekly discussed, diet discussed-just started ozempic         Signed, Jacolyn Reedy, PA-C

## 2022-08-31 ENCOUNTER — Encounter (HOSPITAL_COMMUNITY): Payer: Federal, State, Local not specified - PPO

## 2022-09-05 ENCOUNTER — Other Ambulatory Visit: Payer: Self-pay | Admitting: Cardiology

## 2022-09-12 ENCOUNTER — Emergency Department (HOSPITAL_COMMUNITY)
Admission: EM | Admit: 2022-09-12 | Discharge: 2022-09-12 | Disposition: A | Discharge status: Home / Self Care | Payer: Federal, State, Local not specified - PPO | Attending: Emergency Medicine | Admitting: Emergency Medicine

## 2022-09-12 ENCOUNTER — Other Ambulatory Visit: Payer: Self-pay

## 2022-09-12 ENCOUNTER — Encounter (HOSPITAL_COMMUNITY): Payer: Self-pay

## 2022-09-12 ENCOUNTER — Emergency Department (HOSPITAL_COMMUNITY): Payer: Federal, State, Local not specified - PPO

## 2022-09-12 DIAGNOSIS — K5730 Diverticulosis of large intestine without perforation or abscess without bleeding: Secondary | ICD-10-CM | POA: Diagnosis not present

## 2022-09-12 DIAGNOSIS — R1032 Left lower quadrant pain: Secondary | ICD-10-CM | POA: Diagnosis not present

## 2022-09-12 DIAGNOSIS — I10 Essential (primary) hypertension: Secondary | ICD-10-CM | POA: Diagnosis not present

## 2022-09-12 DIAGNOSIS — Z1152 Encounter for screening for COVID-19: Secondary | ICD-10-CM | POA: Insufficient documentation

## 2022-09-12 DIAGNOSIS — E669 Obesity, unspecified: Secondary | ICD-10-CM | POA: Insufficient documentation

## 2022-09-12 DIAGNOSIS — R1084 Generalized abdominal pain: Secondary | ICD-10-CM | POA: Diagnosis not present

## 2022-09-12 DIAGNOSIS — Z7982 Long term (current) use of aspirin: Secondary | ICD-10-CM | POA: Insufficient documentation

## 2022-09-12 DIAGNOSIS — Z7901 Long term (current) use of anticoagulants: Secondary | ICD-10-CM | POA: Insufficient documentation

## 2022-09-12 DIAGNOSIS — R109 Unspecified abdominal pain: Secondary | ICD-10-CM | POA: Diagnosis not present

## 2022-09-12 DIAGNOSIS — Z794 Long term (current) use of insulin: Secondary | ICD-10-CM | POA: Diagnosis not present

## 2022-09-12 DIAGNOSIS — Z6838 Body mass index (BMI) 38.0-38.9, adult: Secondary | ICD-10-CM | POA: Insufficient documentation

## 2022-09-12 DIAGNOSIS — D259 Leiomyoma of uterus, unspecified: Secondary | ICD-10-CM | POA: Diagnosis not present

## 2022-09-12 LAB — COMPREHENSIVE METABOLIC PANEL
ALT: 30 U/L (ref 0–44)
AST: 32 U/L (ref 15–41)
Albumin: 4.2 g/dL (ref 3.5–5.0)
Alkaline Phosphatase: 58 U/L (ref 38–126)
Anion gap: 8 (ref 5–15)
BUN: 10 mg/dL (ref 6–20)
CO2: 27 mmol/L (ref 22–32)
Calcium: 9.5 mg/dL (ref 8.9–10.3)
Chloride: 100 mmol/L (ref 98–111)
Creatinine, Ser: 0.65 mg/dL (ref 0.44–1.00)
GFR, Estimated: >60 mL/min (ref >60)
Glucose, Bld: 175 mg/dL — ABNORMAL HIGH (ref 70–99)
Potassium: 3.8 mmol/L (ref 3.5–5.1)
Sodium: 135 mmol/L (ref 135–145)
Total Bilirubin: 0.6 mg/dL (ref 0.3–1.2)
Total Protein: 7.5 g/dL (ref 6.5–8.1)

## 2022-09-12 LAB — URINALYSIS, W/ REFLEX TO CULTURE (INFECTION SUSPECTED)
Bacteria, UA: NONE SEEN
Bilirubin Urine: NEGATIVE
Glucose, UA: NEGATIVE mg/dL
Hgb urine dipstick: NEGATIVE
Ketones, ur: NEGATIVE mg/dL
Leukocytes,Ua: NEGATIVE
Nitrite: NEGATIVE
Protein, ur: NEGATIVE mg/dL
Specific Gravity, Urine: 1.028 (ref 1.005–1.030)
pH: 7 (ref 5.0–8.0)

## 2022-09-12 LAB — CBC WITH DIFFERENTIAL/PLATELET
Abs Immature Granulocytes: 0.02 10*3/uL (ref 0.00–0.07)
Basophils Absolute: 0.1 10*3/uL (ref 0.0–0.1)
Basophils Relative: 1 %
Eosinophils Absolute: 0.1 10*3/uL (ref 0.0–0.5)
Eosinophils Relative: 1 %
HCT: 43.8 % (ref 36.0–46.0)
Hemoglobin: 14.6 g/dL (ref 12.0–15.0)
Immature Granulocytes: 0 %
Lymphocytes Relative: 33 %
Lymphs Abs: 2.4 10*3/uL (ref 0.7–4.0)
MCH: 29.5 pg (ref 26.0–34.0)
MCHC: 33.3 g/dL (ref 30.0–36.0)
MCV: 88.5 fL (ref 80.0–100.0)
Monocytes Absolute: 0.4 10*3/uL (ref 0.1–1.0)
Monocytes Relative: 5 %
Neutro Abs: 4.5 10*3/uL (ref 1.7–7.7)
Neutrophils Relative %: 60 %
Platelets: 241 10*3/uL (ref 150–400)
RBC: 4.95 MIL/uL (ref 3.87–5.11)
RDW: 12.4 % (ref 11.5–15.5)
WBC: 7.5 10*3/uL (ref 4.0–10.5)
nRBC: 0 % (ref 0.0–0.2)

## 2022-09-12 LAB — LIPASE, BLOOD: Lipase: 34 U/L (ref 11–51)

## 2022-09-12 LAB — TROPONIN I (HIGH SENSITIVITY): Troponin I (High Sensitivity): 3 ng/L (ref <18)

## 2022-09-12 LAB — SARS CORONAVIRUS 2 BY RT PCR: SARS Coronavirus 2 by RT PCR: NEGATIVE

## 2022-09-12 MED ORDER — LACTATED RINGERS IV BOLUS
1000.0000 mL | Freq: Once | INTRAVENOUS | Status: AC
  Administered 2022-09-12: 1000 mL via INTRAVENOUS

## 2022-09-12 MED ORDER — ONDANSETRON HCL 4 MG/2ML IJ SOLN
4.0000 mg | Freq: Once | INTRAMUSCULAR | Status: AC
  Administered 2022-09-12: 4 mg via INTRAVENOUS
  Filled 2022-09-12: qty 2

## 2022-09-12 MED ORDER — ONDANSETRON 4 MG PO TBDP: 4.0000 mg | ORAL_TABLET | Freq: Three times a day (TID) | ORAL | 0 refills | Status: AC | PRN

## 2022-09-12 MED ORDER — IOHEXOL 300 MG/ML  SOLN
100.0000 mL | Freq: Once | INTRAMUSCULAR | Status: AC | PRN
  Administered 2022-09-12: 100 mL via INTRAVENOUS

## 2022-09-12 NOTE — ED Notes (Signed)
Pt is unable to urinate at this time. 

## 2022-09-12 NOTE — ED Provider Notes (Signed)
[place] EMERGENCY DEPARTMENT AT [facility] Provider Note   CSN: [id number] Arrival date & time: 09/12/22  5643     History  Chief Complaint  Patient presents with   Abdominal Pain    Vanessa Silva is a 62 y.o. female.  HPI 62 year old female presents with abdominal pain.  She states that 2 days ago she started having a headache and some abdominal pain and then started having diarrhea.  Diarrhea continued yesterday.  Last night she took a leftover azithromycin she had from a previous sinus infection about a month ago.  This morning she does not have any specific diarrhea but is continuing to have hard to localize abdominal pain.  She denies shortness of breath or chest pain but sometimes feels like she is "suffocating".  However she states with her history of fibromyalgia is hard for her to localize her symptoms.  She denies any urinary symptoms.  She has had nausea but no vomiting.  Home Medications Prior to Admission medications   Medication Sig Start Date End Date Taking? Authorizing Provider  ondansetron (ZOFRAN-ODT) 4 MG disintegrating tablet Take 1 tablet (4 mg total) by mouth every 8 (eight) hours as needed for nausea or vomiting. 09/12/22  Yes Pricilla Loveless, MD  ALPRAZolam Prudy Feeler) 0.5 MG tablet Take 0.25-0.5 mg by mouth at bedtime as needed for anxiety or sleep.     [provider]  apixaban (ELIQUIS) 5 MG TABS tablet Take 1 tablet (5 mg total) by mouth 2 (two) times daily. 08/06/22   Sondra Barges, PA-C  aspirin EC 81 MG tablet Take 81 mg by mouth daily.    [provider]  buPROPion (WELLBUTRIN XL) 150 MG 24 hr tablet Take 150 mg by mouth daily. 08/10/22   [provider]  carvedilol (COREG) 25 MG tablet TAKE 1 TABLET TWICE DAILY  WITH MEALS 02/16/22   Marinus Maw, MD  cephALEXin (KEFLEX) 500 MG capsule Take 1 capsule (500 mg total) by mouth 2 (two) times daily. 05/15/22   Particia Nearing, PA-C  Cholecalciferol (VITAMIN D3  PO) Take 1 capsule by mouth at bedtime.     [provider]  Continuous Blood Gluc Sensor (FREESTYLE LIBRE 14 DAY SENSOR) MISC Apply 1 each topically every 14 (fourteen) days. 01/11/20   [provider]  COVID-19 mRNA Vac-TriS, Pfizer, (PFIZER-BIONT COVID-19 VAC-TRIS) SUSP injection Inject into the muscle. 08/19/20   Judyann Munson, MD  COVID-19 mRNA vaccine [phone] (COMIRNATY) SUSP injection Inject into the muscle. 12/07/21   Judyann Munson, MD  Cyanocobalamin (VITAMIN B-12 PO) Take 2,500 mcg by mouth every morning.     [provider]  docusate sodium (COLACE) 100 MG capsule Take 100 mg by mouth daily.    [provider]  DULoxetine (CYMBALTA) 30 MG capsule 60 mg. 03/13/15   [provider]  enalapril (VASOTEC) 10 MG tablet TAKE 1 TABLET(10 MG) BY MOUTH DAILY 03/13/22   Marinus Maw, MD  furosemide (LASIX) 40 MG tablet TAKE 1 TABLET(40 MG) BY MOUTH DAILY 09/05/22   Antoine Poche, MD  gabapentin (NEURONTIN) 300 MG capsule Take 300 mg by mouth 3 (three) times daily.     [provider]  influenza vac split quadrivalent PF (FLUARIX) 0.5 ML injection Inject into the muscle. 12/07/21   Judyann Munson, MD  insulin aspart (NOVOLOG) 100 UNIT/ML injection Inject 0-9 Units into the skin 3 (three) times daily with meals. Patient taking differently: Inject 0-9 Units into the skin See admin  instructions. Inject 0-9 units subque 3 times daily with meals and at bedtime per sliding scale. 11/04/15   Langston Reusing, MD  JARDIANCE 25 MG TABS tablet Take 25 mg by mouth daily. 11/24/17   [provider]  levocetirizine (XYZAL) 5 MG tablet Take 5 mg by mouth every evening.    [provider]  levothyroxine (SYNTHROID, LEVOTHROID) 50 MCG tablet Take 50 mcg by mouth daily before breakfast.    [provider]  metFORMIN (GLUCOPHAGE-XR) 500 MG 24 hr tablet Take 1,000 mg by mouth daily. 12/29/19   [provider]  mupirocin  ointment (BACTROBAN) 2 % Apply 1 Application topically 2 (two) times daily. 05/15/22   Particia Nearing, PA-C  nitroGLYCERIN (NITROSTAT) 0.4 MG SL tablet Place 1 tablet (0.4 mg total) under the tongue every 5 (five) minutes as needed for chest pain. 12/18/17   Marily Lente, NP  omeprazole (PRILOSEC) 20 MG capsule Take 20 mg by mouth 2 (two) times daily before a meal.    [provider]  OZEMPIC, 0.25 OR 0.5 MG/DOSE, 2 MG/3ML SOPN Inject 0.5 mg into the skin once a week. 07/04/22   [provider]  pravastatin (PRAVACHOL) 20 MG tablet TAKE 1 TABLET(20 MG) BY MOUTH EVERY EVENING 12/07/19   Branch, Dorothe Pea, MD  TRESIBA FLEXTOUCH 200 UNIT/ML SOPN INJECT 72 UNITS SUBCUTANEOUSLY EVERY DAY AT BEDTIME 02/04/15   [provider]      Allergies    Chlorhexidine gluconate, Demerol [meperidine], Morphine and codeine, and Promethazine    Review of Systems   Review of Systems  Constitutional:  Negative for fever.  Cardiovascular:  Negative for chest pain.  Gastrointestinal:  Positive for abdominal pain, diarrhea and nausea. Negative for blood in stool and vomiting.  Genitourinary:  Negative for dysuria.    Physical Exam Updated Vital Signs BP (!) 150/80 (BP Location: Right Arm)   Pulse 60   Temp 98.2 F (36.8 C) (Oral)   Resp 18   Ht 5\' 8"  (1.727 m)   Wt 116.1 kg   LMP 06/30/2012   SpO2 99%   BMI 38.92 kg/m  Physical Exam Vitals and nursing note reviewed.  Constitutional:      Appearance: She is well-developed. She is obese.  HENT:     Head: Normocephalic and atraumatic.  Cardiovascular:     Rate and Rhythm: Normal rate and regular rhythm.     Heart sounds: Normal heart sounds.  Pulmonary:     Effort: Pulmonary effort is normal.     Breath sounds: Normal breath sounds.  Abdominal:     Palpations: Abdomen is soft.     Tenderness: There is abdominal tenderness (mild) in the right lower quadrant, periumbilical area, suprapubic area and left lower  quadrant.  Skin:    General: Skin is warm and dry.  Neurological:     Mental Status: She is alert.     ED Results / Procedures / Treatments   Labs (all labs ordered are listed, but only abnormal results are displayed) Labs Reviewed  COMPREHENSIVE METABOLIC PANEL - Abnormal; Notable for the following components:      Result Value   Glucose, Bld 175 (*)    All other components within normal limits  SARS CORONAVIRUS 2 BY RT PCR  URINALYSIS, W/ REFLEX TO CULTURE (INFECTION SUSPECTED)  LIPASE, BLOOD  CBC WITH DIFFERENTIAL/PLATELET  TROPONIN I (HIGH SENSITIVITY)    EKG None  Radiology CT ABDOMEN PELVIS W CONTRAST  Result Date: 09/12/2022 CLINICAL DATA:  Left lower quadrant pain with nausea and diarrhea. EXAM: CT ABDOMEN AND PELVIS WITH CONTRAST TECHNIQUE: Multidetector CT imaging of the abdomen and pelvis was performed using the standard protocol following bolus administration of intravenous contrast. RADIATION DOSE REDUCTION: This exam was performed according to the departmental dose-optimization program which includes automated exposure control, adjustment of the mA and/or kV according to patient size and/or use of iterative reconstruction technique. CONTRAST:  OMNIPAQUE IOHEXOL 300 MG/ML  SOLN COMPARISON:  03/16/2015 FINDINGS: Lower chest: Unremarkable. Hepatobiliary: No suspicious focal abnormality within the liver parenchyma. There is no evidence for gallstones, gallbladder wall thickening, or pericholecystic fluid. No intrahepatic or extrahepatic biliary dilation. Pancreas: No focal mass lesion. No dilatation of the main duct. No intraparenchymal cyst. No peripancreatic edema. Spleen: No splenomegaly. No suspicious focal mass lesion. Adrenals/Urinary Tract: No adrenal nodule or mass. Kidneys unremarkable. No evidence for hydroureter. The urinary bladder appears normal for the degree of distention. Stomach/Bowel: Stomach is unremarkable. No gastric wall thickening. No evidence of  outlet obstruction. Duodenum is normally positioned as is the ligament of Treitz. No small bowel wall thickening. No small bowel dilatation. The terminal ileum is normal. The appendix is normal. No gross colonic mass. No colonic wall thickening. Diverticuli are seen scattered along the entire length of the colon without CT findings of diverticulitis. Vascular/Lymphatic: There is moderate atherosclerotic calcification of the abdominal aorta without aneurysm. There is no gastrohepatic or hepatoduodenal ligament lymphadenopathy. No retroperitoneal or mesenteric lymphadenopathy. Portal vein and superior mesenteric vein are patent. Celiac axis and SMA are patent. IMA appears patent. No pelvic sidewall lymphadenopathy. Reproductive: Uterine fibroids evident.  There is no adnexal mass. Other: No intraperitoneal free fluid. Musculoskeletal: Focal area of subcutaneous soft tissue attenuation in the right anterior abdominal wall may be related to an injection site although cellulitis could have this appearance. (Image 60/series 2). No worrisome lytic or sclerotic osseous abnormality. IMPRESSION: 1. No acute findings in the abdomen or pelvis. Specifically, no findings to explain the patient's history of left lower quadrant pain with nausea and diarrhea. 2. Diffuse colonic diverticulosis without diverticulitis. 3. Uterine fibroids. 4. Focal area of subcutaneous soft tissue attenuation in the right anterior abdominal wall may be related to an injection site although cellulitis could have this appearance. 5.  Aortic Atherosclerosis (ICD10-I70.0). Electronically Signed   By: Kennith Center M.D.   On: 09/12/2022 09:03    Procedures Procedures    Medications Ordered in ED Medications  lactated ringers bolus 1,000 mL (0 mLs Intravenous Stopped 09/12/22 1030)  iohexol (OMNIPAQUE) 300 MG/ML solution 100 mL (100 mLs Intravenous Contrast Given 09/12/22 0835)  ondansetron (ZOFRAN) injection 4 mg (4 mg Intravenous Given 09/12/22  1033)    ED Course/ Medical Decision Making/ A&P                             Medical Decision Making Amount and/or Complexity of Data Reviewed Labs: ordered.    Details: Normal WBC.  Unremarkable electrolytes.  Hello Radiology: ordered and independent interpretation performed.    Details: No diverticulitis  Risk Prescription drug management.   Patient is feeling better.  She has declined thing for pain.  Generalized nonspecific abdominal pain.  CT is unremarkable besides skin inflammation though it seems like this is from her injections for diabetes.  I do not see evidence of cellulitis on exam.  No focal tenderness in that area.  Otherwise she is feeling better and I think stable for  discharge home with return precautions.  No urinary symptoms and a negative UA.  Will give return precautions and a course of Zofran as needed.       Final Clinical Impression(s) / ED Diagnoses Final diagnoses:  Generalized abdominal pain    Rx / DC Orders ED Discharge Orders          Ordered    ondansetron (ZOFRAN-ODT) 4 MG disintegrating tablet  Every 8 hours PRN        09/12/22 1020              Pricilla Loveless, MD 09/12/22 1503

## 2022-09-12 NOTE — ED Triage Notes (Signed)
Pt c/o of not feeling well starting on Monday. Pt states she has had nausea and diarrhea since Monday with generalized pain, especially abd pain. Denies vomiting. States she doesn't know if she has had any fevers.

## 2022-09-12 NOTE — Discharge Instructions (Signed)
Your COVID test results will show up in MyChart.    If you develop worsening, continued, or recurrent abdominal pain, uncontrolled vomiting, fever, chest or back pain, or any other new/concerning symptoms then return to the ER for evaluation.

## 2022-09-14 DIAGNOSIS — Z6838 Body mass index (BMI) 38.0-38.9, adult: Secondary | ICD-10-CM | POA: Diagnosis not present

## 2022-09-14 DIAGNOSIS — I10 Essential (primary) hypertension: Secondary | ICD-10-CM | POA: Diagnosis not present

## 2022-09-14 DIAGNOSIS — F320 Major depressive disorder, single episode, mild: Secondary | ICD-10-CM | POA: Diagnosis not present

## 2022-09-14 DIAGNOSIS — B351 Tinea unguium: Secondary | ICD-10-CM | POA: Diagnosis not present

## 2022-09-14 DIAGNOSIS — R1030 Lower abdominal pain, unspecified: Secondary | ICD-10-CM | POA: Diagnosis not present

## 2022-09-14 DIAGNOSIS — M797 Fibromyalgia: Secondary | ICD-10-CM | POA: Diagnosis not present

## 2022-09-14 DIAGNOSIS — F419 Anxiety disorder, unspecified: Secondary | ICD-10-CM | POA: Diagnosis not present

## 2022-09-14 DIAGNOSIS — E6609 Other obesity due to excess calories: Secondary | ICD-10-CM | POA: Diagnosis not present

## 2022-10-02 ENCOUNTER — Other Ambulatory Visit (HOSPITAL_COMMUNITY): Payer: Federal, State, Local not specified - PPO

## 2022-10-11 ENCOUNTER — Ambulatory Visit: Payer: Federal, State, Local not specified - PPO | Admitting: Student

## 2022-10-25 ENCOUNTER — Telehealth (HOSPITAL_COMMUNITY): Payer: Self-pay | Admitting: Emergency Medicine

## 2022-10-26 ENCOUNTER — Encounter: Payer: Self-pay | Admitting: Student

## 2022-10-26 ENCOUNTER — Encounter (HOSPITAL_COMMUNITY)
Admission: RE | Admit: 2022-10-26 | Discharge: 2022-10-26 | Disposition: A | Discharge status: Home / Self Care | Payer: Federal, State, Local not specified - PPO | Source: Ambulatory Visit | Attending: Physician Assistant | Admitting: Physician Assistant

## 2022-10-26 ENCOUNTER — Ambulatory Visit (HOSPITAL_BASED_OUTPATIENT_CLINIC_OR_DEPARTMENT_OTHER)
Admission: RE | Admit: 2022-10-26 | Discharge: 2022-10-26 | Disposition: A | Discharge status: Home / Self Care | Payer: Federal, State, Local not specified - PPO | Source: Ambulatory Visit | Attending: Physician Assistant | Admitting: Physician Assistant

## 2022-10-26 ENCOUNTER — Ambulatory Visit (HOSPITAL_COMMUNITY)
Admission: RE | Admit: 2022-10-26 | Discharge: 2022-10-26 | Disposition: A | Discharge status: Home / Self Care | Payer: Federal, State, Local not specified - PPO | Source: Ambulatory Visit | Attending: Family Medicine | Admitting: Family Medicine

## 2022-10-26 DIAGNOSIS — R079 Chest pain, unspecified: Secondary | ICD-10-CM | POA: Insufficient documentation

## 2022-10-26 LAB — ECHOCARDIOGRAM COMPLETE
Area-P 1/2: 4.89 cm2
S' Lateral: 3 cm

## 2022-10-26 LAB — NM MYOCAR MULTI W/SPECT W/WALL MOTION / EF
LV dias vol: 78 mL (ref 46–106)
LV sys vol: 37 mL
Nuc Stress EF: 52 %
Peak HR: 92 {beats}/min
RATE: 0.4
Rest HR: 62 {beats}/min
Rest Nuclear Isotope Dose: 10.5 mCi
SDS: 1
SRS: 0
SSS: 1
ST Depression (mm): 0 mm
Stress Nuclear Isotope Dose: 30.9 mCi

## 2022-10-26 MED ORDER — SODIUM CHLORIDE FLUSH 0.9 % IV SOLN
INTRAVENOUS | Status: AC
  Administered 2022-10-26: 10 mL via INTRAVENOUS
  Filled 2022-10-26: qty 10

## 2022-10-26 MED ORDER — TECHNETIUM TC 99M TETROFOSMIN IV KIT
30.9000 | PACK | Freq: Once | INTRAVENOUS | Status: AC | PRN
  Administered 2022-10-26: 30.9 via INTRAVENOUS

## 2022-10-26 MED ORDER — REGADENOSON 0.4 MG/5ML IV SOLN
INTRAVENOUS | Status: AC
  Administered 2022-10-26: 0.4 mg via INTRAVENOUS
  Filled 2022-10-26: qty 5

## 2022-10-26 MED ORDER — TECHNETIUM TC 99M TETROFOSMIN IV KIT
10.5000 | PACK | Freq: Once | INTRAVENOUS | Status: AC | PRN
  Administered 2022-10-26: 10.5 via INTRAVENOUS

## 2022-10-26 NOTE — Progress Notes (Signed)
*  PRELIMINARY RESULTS* Echocardiogram 2D Echocardiogram has been performed.  Stacey Drain 10/26/2022, 11:36 AM

## 2022-10-26 NOTE — Progress Notes (Signed)
*  PRELIMINARY RESULTS* Echocardiogram 2D Echocardiogram has been performed.  Stacey Drain 10/26/2022, 12:23 PM

## 2023-01-27 ENCOUNTER — Other Ambulatory Visit: Payer: Self-pay | Admitting: Physician Assistant

## 2023-01-27 DIAGNOSIS — I4891 Unspecified atrial fibrillation: Secondary | ICD-10-CM

## 2023-01-28 NOTE — Telephone Encounter (Signed)
Prescription refill request for Eliquis received. Indication:afib Last office visit:6/24 Scr:0.65  7/24 Age: 62 Weight:116.1  kg  Prescription refilled

## 2023-03-01 DIAGNOSIS — Z6838 Body mass index (BMI) 38.0-38.9, adult: Secondary | ICD-10-CM | POA: Diagnosis not present

## 2023-03-01 DIAGNOSIS — M62830 Muscle spasm of back: Secondary | ICD-10-CM | POA: Diagnosis not present

## 2023-03-01 DIAGNOSIS — E6609 Other obesity due to excess calories: Secondary | ICD-10-CM | POA: Diagnosis not present

## 2023-03-01 DIAGNOSIS — E109 Type 1 diabetes mellitus without complications: Secondary | ICD-10-CM | POA: Diagnosis not present

## 2023-03-15 DIAGNOSIS — F320 Major depressive disorder, single episode, mild: Secondary | ICD-10-CM | POA: Diagnosis not present

## 2023-03-15 DIAGNOSIS — I10 Essential (primary) hypertension: Secondary | ICD-10-CM | POA: Diagnosis not present

## 2023-03-15 DIAGNOSIS — I5032 Chronic diastolic (congestive) heart failure: Secondary | ICD-10-CM | POA: Diagnosis not present

## 2023-03-15 DIAGNOSIS — I480 Paroxysmal atrial fibrillation: Secondary | ICD-10-CM | POA: Diagnosis not present

## 2023-03-28 ENCOUNTER — Other Ambulatory Visit: Payer: Self-pay | Admitting: Internal Medicine

## 2023-03-28 DIAGNOSIS — E038 Other specified hypothyroidism: Secondary | ICD-10-CM | POA: Diagnosis not present

## 2023-03-28 DIAGNOSIS — E1165 Type 2 diabetes mellitus with hyperglycemia: Secondary | ICD-10-CM | POA: Diagnosis not present

## 2023-03-28 DIAGNOSIS — E7800 Pure hypercholesterolemia, unspecified: Secondary | ICD-10-CM | POA: Diagnosis not present

## 2023-03-28 NOTE — Telephone Encounter (Signed)
Refill Request.

## 2023-04-04 DIAGNOSIS — E1165 Type 2 diabetes mellitus with hyperglycemia: Secondary | ICD-10-CM | POA: Diagnosis not present

## 2023-04-04 DIAGNOSIS — I10 Essential (primary) hypertension: Secondary | ICD-10-CM | POA: Diagnosis not present

## 2023-04-04 DIAGNOSIS — E1140 Type 2 diabetes mellitus with diabetic neuropathy, unspecified: Secondary | ICD-10-CM | POA: Diagnosis not present

## 2023-04-04 DIAGNOSIS — E7800 Pure hypercholesterolemia, unspecified: Secondary | ICD-10-CM | POA: Diagnosis not present

## 2023-04-09 ENCOUNTER — Other Ambulatory Visit (HOSPITAL_BASED_OUTPATIENT_CLINIC_OR_DEPARTMENT_OTHER): Payer: Self-pay

## 2023-04-11 DIAGNOSIS — Z961 Presence of intraocular lens: Secondary | ICD-10-CM | POA: Diagnosis not present

## 2023-04-11 DIAGNOSIS — H4312 Vitreous hemorrhage, left eye: Secondary | ICD-10-CM | POA: Diagnosis not present

## 2023-04-11 DIAGNOSIS — E113553 Type 2 diabetes mellitus with stable proliferative diabetic retinopathy, bilateral: Secondary | ICD-10-CM | POA: Diagnosis not present

## 2023-04-11 DIAGNOSIS — H26493 Other secondary cataract, bilateral: Secondary | ICD-10-CM | POA: Diagnosis not present

## 2023-04-12 ENCOUNTER — Other Ambulatory Visit (HOSPITAL_BASED_OUTPATIENT_CLINIC_OR_DEPARTMENT_OTHER): Payer: Self-pay

## 2023-04-12 MED ORDER — COMIRNATY 30 MCG/0.3ML IM SUSY
PREFILLED_SYRINGE | INTRAMUSCULAR | 0 refills | Status: AC
  Filled 2023-04-12: qty 0.3, 1d supply, fill #0

## 2023-04-12 MED ORDER — FLULAVAL 0.5 ML IM SUSY
PREFILLED_SYRINGE | INTRAMUSCULAR | 0 refills | Status: AC
  Filled 2023-04-12: qty 0.5, 1d supply, fill #0

## 2023-04-14 ENCOUNTER — Other Ambulatory Visit: Payer: Self-pay | Admitting: Internal Medicine

## 2023-05-12 ENCOUNTER — Other Ambulatory Visit: Payer: Self-pay | Admitting: Cardiology

## 2023-05-28 DIAGNOSIS — K080 Exfoliation of teeth due to systemic causes: Secondary | ICD-10-CM | POA: Diagnosis not present

## 2023-06-21 DIAGNOSIS — Z6841 Body Mass Index (BMI) 40.0 and over, adult: Secondary | ICD-10-CM | POA: Diagnosis not present

## 2023-06-21 DIAGNOSIS — M545 Low back pain, unspecified: Secondary | ICD-10-CM | POA: Diagnosis not present

## 2023-06-21 DIAGNOSIS — B351 Tinea unguium: Secondary | ICD-10-CM | POA: Diagnosis not present

## 2023-06-29 ENCOUNTER — Encounter (HOSPITAL_COMMUNITY): Payer: Self-pay | Admitting: Emergency Medicine

## 2023-06-29 ENCOUNTER — Other Ambulatory Visit: Payer: Self-pay

## 2023-06-29 ENCOUNTER — Emergency Department (HOSPITAL_COMMUNITY)
Admission: EM | Admit: 2023-06-29 | Discharge: 2023-06-29 | Disposition: A | Discharge status: Home / Self Care | Attending: Emergency Medicine | Admitting: Emergency Medicine

## 2023-06-29 DIAGNOSIS — K0889 Other specified disorders of teeth and supporting structures: Secondary | ICD-10-CM | POA: Insufficient documentation

## 2023-06-29 DIAGNOSIS — M5441 Lumbago with sciatica, right side: Secondary | ICD-10-CM | POA: Diagnosis not present

## 2023-06-29 DIAGNOSIS — Z7982 Long term (current) use of aspirin: Secondary | ICD-10-CM | POA: Diagnosis not present

## 2023-06-29 DIAGNOSIS — I2510 Atherosclerotic heart disease of native coronary artery without angina pectoris: Secondary | ICD-10-CM | POA: Insufficient documentation

## 2023-06-29 DIAGNOSIS — I10 Essential (primary) hypertension: Secondary | ICD-10-CM | POA: Diagnosis not present

## 2023-06-29 DIAGNOSIS — Z7901 Long term (current) use of anticoagulants: Secondary | ICD-10-CM | POA: Diagnosis not present

## 2023-06-29 DIAGNOSIS — Z79899 Other long term (current) drug therapy: Secondary | ICD-10-CM | POA: Insufficient documentation

## 2023-06-29 MED ORDER — METHOCARBAMOL 500 MG PO TABS: 500.0000 mg | ORAL_TABLET | Freq: Three times a day (TID) | ORAL | 0 refills | Status: AC

## 2023-06-29 MED ORDER — OXYCODONE-ACETAMINOPHEN 5-325 MG PO TABS: 1.0000 | ORAL_TABLET | ORAL | 0 refills | Status: AC | PRN

## 2023-06-29 MED ORDER — PENICILLIN V POTASSIUM 250 MG PO TABS
500.0000 mg | ORAL_TABLET | Freq: Once | ORAL | Status: AC
  Administered 2023-06-29: 500 mg via ORAL
  Filled 2023-06-29: qty 2

## 2023-06-29 MED ORDER — OXYCODONE-ACETAMINOPHEN 5-325 MG PO TABS
1.0000 | ORAL_TABLET | Freq: Once | ORAL | Status: AC
  Administered 2023-06-29: 1 via ORAL
  Filled 2023-06-29: qty 1

## 2023-06-29 MED ORDER — PENICILLIN V POTASSIUM 500 MG PO TABS: 500.0000 mg | ORAL_TABLET | Freq: Three times a day (TID) | ORAL | 0 refills | Status: AC

## 2023-06-29 NOTE — ED Provider Notes (Signed)
 [place] EMERGENCY DEPARTMENT AT [facility] Provider Note   CSN: [id number] Arrival date & time: 06/29/23  1224     History  Chief Complaint  Patient presents with   Dental Pain   Back Pain    Vanessa Silva is a 63 y.o. female.   Dental Pain Associated symptoms: no facial swelling, no fever and no headaches   Back Pain Associated symptoms: no abdominal pain, no chest pain, no dysuria, no fever, no headaches and no weakness        Vanessa Silva is a 63 y.o. female with past medical history of GERD, coronary artery disease, fibromyalgia, hypertension, SVT and atrial flutter anticoagulated on Eliquis  who presents to the Emergency Department complaining of right lower dental pain and right-sided low back pain.  She has had low back pain for some time, with pain worse this week, recent injury or fall.  She describes sharp stabbing type pain from her right buttock that radiates to the backside of her right thigh.  She has been trying over-the-counter therapies and exercising without relief.  She denies any abdominal pain, urine or bowel changes, numbness or weakness of the extremity fever or chills.  She is also complains of pain from her right lower tooth for several days.  Pain is gradually worsening.  She is use over-the-counter Anbesol without relief.  She describes pain radiating toward her right ear.  She has appointment with her PCP on Monday and an appointment with the dentist as well.  Here requesting pain control until her appointment time.    Home Medications Prior to Admission medications   Medication Sig Start Date End Date Taking? Authorizing Provider  ALPRAZolam  (XANAX ) 0.5 MG tablet Take 0.25-0.5 mg by mouth at bedtime as needed for anxiety or sleep.     [provider]  aspirin  EC 81 MG tablet Take 81 mg by mouth daily.    [provider]  buPROPion (WELLBUTRIN XL) 150 MG 24 hr tablet Take 150 mg by mouth daily. 08/10/22   [provider]  carvedilol  (COREG ) 25 MG tablet TAKE 1 TABLET TWICE DAILY  WITH MEALS 04/16/23   Laurann Pollock, MD  cephALEXin  (KEFLEX ) 500 MG capsule Take 1 capsule (500 mg total) by mouth 2 (two) times daily. 05/15/22   Corbin Dess, PA-C  Cholecalciferol  (VITAMIN D3 PO) Take 1 capsule by mouth at bedtime.     [provider]  Continuous Blood Gluc Sensor (FREESTYLE LIBRE 14 DAY SENSOR) MISC Apply 1 each topically every 14 (fourteen) days. 01/11/20   [provider]  COVID-19 mRNA Vac-TriS, Pfizer, (PFIZER-BIONT COVID-19 VAC-TRIS) SUSP injection Inject into the muscle. 08/19/20   Liane Redman, MD  COVID-19 mRNA vaccine [id number] (COMIRNATY ) SUSP injection Inject into the muscle. 12/07/21   Liane Redman, MD  COVID-19 mRNA vaccine, Pfizer, (COMIRNATY ) syringe Inject into the muscle. 04/12/23   Liane Redman, MD  Cyanocobalamin  (VITAMIN B-12 PO) Take 2,500 mcg by mouth every morning.     [provider]  docusate sodium  (COLACE) 100 MG capsule Take 100 mg by mouth daily.    [provider]  DULoxetine  (CYMBALTA ) 30 MG capsule 60 mg. 03/13/15   [provider]  ELIQUIS  5 MG TABS tablet TAKE 1 TABLET TWICE A DAY 01/28/23   Dunn, Ryan M, PA-C  enalapril  (VASOTEC ) 10 MG tablet TAKE 1 TABLET(10 MG) BY MOUTH DAILY 03/28/23   Tammie Fall, MD  furosemide  (LASIX ) 40 MG tablet TAKE 1 TABLET(40  MG) BY MOUTH DAILY 05/13/23   Laurann Pollock, MD  gabapentin  (NEURONTIN ) 300 MG capsule Take 300 mg by mouth 3 (three) times daily.     [provider]  influenza vac split quadrivalent PF (FLUARIX) 0.5 ML injection Inject into the muscle. 12/07/21   Liane Redman, MD  influenza vac split trivalent PF (FLULAVAL) 0.5 ML injection Inject into the muscle. 04/12/23   Liane Redman, MD  insulin  aspart (NOVOLOG ) 100 UNIT/ML injection Inject 0-9 Units into the skin 3 (three) times daily with meals. Patient taking differently: Inject 0-9 Units into  the skin See admin instructions. Inject 0-9 units subque 3 times daily with meals and at bedtime per sliding scale. 11/04/15   Jenean Minus, MD  JARDIANCE 25 MG TABS tablet Take 25 mg by mouth daily. 11/24/17   [provider]  levocetirizine (XYZAL) 5 MG tablet Take 5 mg by mouth every evening.    [provider]  levothyroxine (SYNTHROID, LEVOTHROID) 50 MCG tablet Take 50 mcg by mouth daily before breakfast.    [provider]  metFORMIN  (GLUCOPHAGE -XR) 500 MG 24 hr tablet Take 1,000 mg by mouth daily. 12/29/19   [provider]  mupirocin  ointment (BACTROBAN ) 2 % Apply 1 Application topically 2 (two) times daily. 05/15/22   Corbin Dess, PA-C  nitroGLYCERIN  (NITROSTAT ) 0.4 MG SL tablet Place 1 tablet (0.4 mg total) under the tongue every 5 (five) minutes as needed for chest pain. 12/18/17   Seiler, Amber K, NP  omeprazole (PRILOSEC) 20 MG capsule Take 20 mg by mouth 2 (two) times daily before a meal.    [provider]  ondansetron  (ZOFRAN -ODT) 4 MG disintegrating tablet Take 1 tablet (4 mg total) by mouth every 8 (eight) hours as needed for nausea or vomiting. 09/12/22   Jerilynn Montenegro, MD  OZEMPIC, 0.25 OR 0.5 MG/DOSE, 2 MG/3ML SOPN Inject 0.5 mg into the skin once a week. 07/04/22   [provider]  pravastatin  (PRAVACHOL ) 20 MG tablet TAKE 1 TABLET(20 MG) BY MOUTH EVERY EVENING 12/07/19   Laurann Pollock, MD  TRESIBA  FLEXTOUCH 200 UNIT/ML SOPN INJECT 72 UNITS SUBCUTANEOUSLY EVERY DAY AT BEDTIME 02/04/15   [provider]      Allergies    Chlorhexidine  gluconate, Demerol [meperidine], Morphine and codeine, and Promethazine    Review of Systems   Review of Systems  Constitutional:  Negative for appetite change, chills and fever.  HENT:  Positive for dental problem. Negative for facial swelling, sore throat and trouble swallowing.   Eyes:  Negative for visual disturbance.  Respiratory:  Negative for shortness of  breath.   Cardiovascular:  Negative for chest pain.  Gastrointestinal:  Negative for abdominal pain, nausea and vomiting.  Genitourinary:  Negative for difficulty urinating, dysuria, flank pain and frequency.  Musculoskeletal:  Positive for back pain.  Skin:  Negative for wound.  Neurological:  Negative for dizziness, weakness and headaches.    Physical Exam Updated Vital Signs BP (!) 186/80   Pulse 70   Temp 98.2 F (36.8 C) (Oral)   Resp 18   Ht 5\' 8"  (1.727 m)   Wt 119.3 kg   LMP 06/30/2012   SpO2 99%   BMI 39.99 kg/m  Physical Exam Vitals and nursing note reviewed.  Constitutional:      General: She is not in acute distress.    Appearance: Normal appearance. She is not toxic-appearing.  HENT:     Right Ear: Tympanic membrane and ear canal normal.  Left Ear: Tympanic membrane and ear canal normal.     Mouth/Throat:     Mouth: Mucous membranes are moist.     Dentition: Dental tenderness and dental caries present. No gingival swelling or dental abscesses.     Pharynx: Oropharynx is clear. Uvula midline. No uvula swelling.     Comments: Dental caries with tenderness palpation of the right lower lateral incisor.  Black discoloration noted of the tooth.  No erythema or fluctuance of the surrounding gums.  No facial edema.  Uvula is midline nonedematous.  No sublingual abnormality.  No trismus Eyes:     Conjunctiva/sclera: Conjunctivae normal.     Pupils: Pupils are equal, round, and reactive to light.  Cardiovascular:     Rate and Rhythm: Normal rate and regular rhythm.     Pulses: Normal pulses.  Pulmonary:     Effort: Pulmonary effort is normal.     Breath sounds: Normal breath sounds.  Chest:     Chest wall: No tenderness.  Abdominal:     Palpations: Abdomen is soft.     Tenderness: There is no abdominal tenderness.  Musculoskeletal:        General: Tenderness present.     Lumbar back: Tenderness present. Positive right straight leg raise test. Negative left  straight leg raise test.     Comments: Tender to palpation right SI joint space.  No midline tenderness.  No bony step-offs.  Pain with straight leg raise on the right.  Skin:    General: Skin is warm.     Capillary Refill: Capillary refill takes less than 2 seconds.  Neurological:     General: No focal deficit present.     Mental Status: She is alert.     Sensory: No sensory deficit.     Motor: No weakness.     ED Results / Procedures / Treatments   Labs (all labs ordered are listed, but only abnormal results are displayed) Labs Reviewed - No data to display  EKG None  Radiology No results found.  Procedures Procedures    Medications Ordered in ED Medications  oxyCODONE-acetaminophen  (PERCOCET/ROXICET) 5-325 MG per tablet 1 tablet (has no administration in time range)  penicillin v potassium (VEETID) tablet 500 mg (has no administration in time range)    ED Course/ Medical Decision Making/ A&P                                 Medical Decision Making Patient here with right sided dental pain and right-sided back pain.  Both have been persistent for several days.  She has follow-up appointments for Monday for both.  Here requesting pain control.  Denies any new or worsening symptoms involving her back.  No facial edema difficulty swallowing or trismus on exam.  I suspect sciatica.  She has a normal neurologic exam and she is ambulatory without ataxia.  No red flags on exam concerning for cauda equina.  She does have tenderness and dental decay of the right lower lateral incisor.  I suspect this is because of her pain.  No concerning symptoms for Ludewig's angina.  Amount and/or Complexity of Data Reviewed Discussion of management or test interpretation with external provider(s): Patient overall well-appearing no indication for imaging or labs at this time.  Will provide short course of pain medication and Pen-Vee K for her dental infection.  Database reviewed.  she will keep  her upcoming appointments for Monday  Risk Prescription drug management.           Final Clinical Impression(s) / ED Diagnoses Final diagnoses:  Pain, dental  Acute right-sided low back pain with right-sided sciatica    Rx / DC Orders ED Discharge Orders     None         Mitzie Anda 06/29/23 1336    Dorenda Gandy, MD 06/29/23 1531

## 2023-06-29 NOTE — Discharge Instructions (Addendum)
 You may continue the over-the-counter 4% lidocaine  patches to your back and the affected area.  You have been prescribed short course of pain medication, and antibiotics for your dental pain.  This should also help with your back pain.  I have also prescribed a muscle relaxer to help with your back.  Please keep your upcoming appointments for Monday.

## 2023-06-29 NOTE — ED Triage Notes (Signed)
 Pt to ER with c/o dental pain from a broken tooth that has been bother her for several days.  Also states was seen by doctor a month ago for back pain and was told it was a pinched nerve.  States today the pain is worse than normal.

## 2023-07-01 ENCOUNTER — Other Ambulatory Visit: Payer: Self-pay

## 2023-07-01 ENCOUNTER — Ambulatory Visit (HOSPITAL_COMMUNITY): Attending: Family Medicine

## 2023-07-01 DIAGNOSIS — M545 Low back pain, unspecified: Secondary | ICD-10-CM | POA: Diagnosis not present

## 2023-07-01 DIAGNOSIS — M6281 Muscle weakness (generalized): Secondary | ICD-10-CM | POA: Diagnosis not present

## 2023-07-01 DIAGNOSIS — M5431 Sciatica, right side: Secondary | ICD-10-CM | POA: Insufficient documentation

## 2023-07-01 DIAGNOSIS — M5430 Sciatica, unspecified side: Secondary | ICD-10-CM | POA: Diagnosis not present

## 2023-07-01 DIAGNOSIS — M5459 Other low back pain: Secondary | ICD-10-CM | POA: Insufficient documentation

## 2023-07-01 DIAGNOSIS — I5032 Chronic diastolic (congestive) heart failure: Secondary | ICD-10-CM | POA: Diagnosis not present

## 2023-07-01 DIAGNOSIS — K047 Periapical abscess without sinus: Secondary | ICD-10-CM | POA: Diagnosis not present
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
## 2023-07-03 ENCOUNTER — Encounter (HOSPITAL_COMMUNITY)

## 2023-07-04 ENCOUNTER — Encounter (HOSPITAL_COMMUNITY)

## 2023-07-05 ENCOUNTER — Ambulatory Visit (HOSPITAL_COMMUNITY)

## 2023-07-05 ENCOUNTER — Encounter (HOSPITAL_COMMUNITY): Payer: Self-pay

## 2023-07-05 DIAGNOSIS — M6281 Muscle weakness (generalized): Secondary | ICD-10-CM

## 2023-07-05 DIAGNOSIS — M5431 Sciatica, right side: Secondary | ICD-10-CM

## 2023-07-05 DIAGNOSIS — M5459 Other low back pain: Secondary | ICD-10-CM
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
## 2023-07-09 DIAGNOSIS — M5416 Radiculopathy, lumbar region: Secondary | ICD-10-CM | POA: Diagnosis not present

## 2023-07-09 DIAGNOSIS — M5459 Other low back pain: Secondary | ICD-10-CM | POA: Diagnosis not present

## 2023-07-11 ENCOUNTER — Encounter (HOSPITAL_COMMUNITY)

## 2023-07-16 ENCOUNTER — Encounter (HOSPITAL_COMMUNITY): Payer: Self-pay

## 2023-07-16 ENCOUNTER — Ambulatory Visit (HOSPITAL_COMMUNITY)

## 2023-07-16 DIAGNOSIS — M5431 Sciatica, right side: Secondary | ICD-10-CM

## 2023-07-16 DIAGNOSIS — M6281 Muscle weakness (generalized): Secondary | ICD-10-CM

## 2023-07-16 DIAGNOSIS — M5459 Other low back pain: Secondary | ICD-10-CM
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
## 2023-07-17 DIAGNOSIS — M5430 Sciatica, unspecified side: Secondary | ICD-10-CM | POA: Diagnosis not present

## 2023-07-17 DIAGNOSIS — L03317 Cellulitis of buttock: Secondary | ICD-10-CM | POA: Diagnosis not present

## 2023-07-17 DIAGNOSIS — Z6841 Body Mass Index (BMI) 40.0 and over, adult: Secondary | ICD-10-CM | POA: Diagnosis not present

## 2023-07-23 ENCOUNTER — Telehealth (HOSPITAL_COMMUNITY): Payer: Self-pay

## 2023-07-23 ENCOUNTER — Encounter (HOSPITAL_COMMUNITY)

## 2023-07-23 NOTE — Telephone Encounter (Signed)
 No show, called and spoke to pt who missed today's apt. Pt stated she has MRI scheduled for tomorrow. Reviewed next apt date and time and requested a call to reschedule/cancel if not able to make it to next apt.   Minor Amble, LPTA/CLT; Johnye Napoleon [phone]

## 2023-07-24 ENCOUNTER — Encounter (HOSPITAL_COMMUNITY)

## 2023-07-24 DIAGNOSIS — M4807 Spinal stenosis, lumbosacral region: Secondary | ICD-10-CM | POA: Diagnosis not present

## 2023-07-24 DIAGNOSIS — M48061 Spinal stenosis, lumbar region without neurogenic claudication: Secondary | ICD-10-CM | POA: Diagnosis not present

## 2023-07-31 ENCOUNTER — Ambulatory Visit (HOSPITAL_COMMUNITY)

## 2023-07-31 ENCOUNTER — Encounter (HOSPITAL_COMMUNITY)

## 2023-07-31 DIAGNOSIS — M48062 Spinal stenosis, lumbar region with neurogenic claudication: Secondary | ICD-10-CM | POA: Diagnosis not present

## 2023-08-02 ENCOUNTER — Encounter (HOSPITAL_COMMUNITY)

## 2023-08-06 ENCOUNTER — Encounter (HOSPITAL_COMMUNITY)

## 2023-08-06 ENCOUNTER — Other Ambulatory Visit: Payer: Self-pay

## 2023-08-06 DIAGNOSIS — I4891 Unspecified atrial fibrillation: Secondary | ICD-10-CM

## 2023-08-06 DIAGNOSIS — M48062 Spinal stenosis, lumbar region with neurogenic claudication: Secondary | ICD-10-CM | POA: Diagnosis not present

## 2023-08-06 MED ORDER — APIXABAN 5 MG PO TABS: 5.0000 mg | ORAL_TABLET | Freq: Two times a day (BID) | ORAL | 1 refills | Status: DC

## 2023-08-06 NOTE — Telephone Encounter (Signed)
 Prescription refill request for Eliquis  received. Indication:AFIB Last office visit:6/24 Scr:0.65  7/24 Age: 63 Weight:119.3  KG  PRESCRIPTION REFILLED

## 2023-08-09 ENCOUNTER — Encounter (HOSPITAL_COMMUNITY)

## 2023-08-14 DIAGNOSIS — M461 Sacroiliitis, not elsewhere classified: Secondary | ICD-10-CM | POA: Diagnosis not present

## 2023-08-22 DIAGNOSIS — M5430 Sciatica, unspecified side: Secondary | ICD-10-CM | POA: Diagnosis not present

## 2023-08-22 DIAGNOSIS — E1142 Type 2 diabetes mellitus with diabetic polyneuropathy: Secondary | ICD-10-CM | POA: Diagnosis not present

## 2023-08-22 DIAGNOSIS — M5416 Radiculopathy, lumbar region: Secondary | ICD-10-CM | POA: Diagnosis not present

## 2023-08-22 DIAGNOSIS — E1165 Type 2 diabetes mellitus with hyperglycemia: Secondary | ICD-10-CM | POA: Diagnosis not present

## 2023-08-22 DIAGNOSIS — I480 Paroxysmal atrial fibrillation: Secondary | ICD-10-CM | POA: Diagnosis not present

## 2023-08-22 DIAGNOSIS — M545 Low back pain, unspecified: Secondary | ICD-10-CM | POA: Diagnosis not present

## 2023-08-22 DIAGNOSIS — E6609 Other obesity due to excess calories: Secondary | ICD-10-CM | POA: Diagnosis not present

## 2023-08-22 DIAGNOSIS — I5032 Chronic diastolic (congestive) heart failure: Secondary | ICD-10-CM | POA: Diagnosis not present

## 2023-08-22 DIAGNOSIS — Z6839 Body mass index (BMI) 39.0-39.9, adult: Secondary | ICD-10-CM | POA: Diagnosis not present

## 2023-09-16 ENCOUNTER — Other Ambulatory Visit: Payer: Self-pay | Admitting: Cardiology

## 2023-09-17 DIAGNOSIS — E1165 Type 2 diabetes mellitus with hyperglycemia: Secondary | ICD-10-CM | POA: Diagnosis not present

## 2023-09-17 DIAGNOSIS — M545 Low back pain, unspecified: Secondary | ICD-10-CM | POA: Diagnosis not present

## 2023-09-17 DIAGNOSIS — M5416 Radiculopathy, lumbar region: Secondary | ICD-10-CM | POA: Diagnosis not present

## 2023-09-17 DIAGNOSIS — I5032 Chronic diastolic (congestive) heart failure: Secondary | ICD-10-CM | POA: Diagnosis not present

## 2023-09-18 DIAGNOSIS — M461 Sacroiliitis, not elsewhere classified: Secondary | ICD-10-CM | POA: Diagnosis not present

## 2023-10-03 DIAGNOSIS — M48061 Spinal stenosis, lumbar region without neurogenic claudication: Secondary | ICD-10-CM | POA: Diagnosis not present

## 2023-10-03 DIAGNOSIS — G894 Chronic pain syndrome: Secondary | ICD-10-CM | POA: Diagnosis not present

## 2023-10-03 DIAGNOSIS — M4726 Other spondylosis with radiculopathy, lumbar region: Secondary | ICD-10-CM | POA: Diagnosis not present

## 2023-10-04 ENCOUNTER — Telehealth: Payer: Self-pay

## 2023-10-04 NOTE — Telephone Encounter (Signed)
   Pre-operative Risk Assessment    Patient Name: Vanessa Silva  DOB: 02/18/1961 MRN: [id number]   Date of last office visit: 08/21/22 OLIVIA PAVY, PA-C Date of next office visit: 10/09/23 DORN ROSS, MD   Request for Surgical Clearance    Procedure:  RIGHT L4-5, L5-S1 TFESI  Date of Surgery:  Clearance TBD                                Surgeon:  ALM LOWER, MD Surgeon's Group or Practice Name:  [facility] [place] Phone number:  [phone] Fax number:  [phone]   Type of Clearance Requested:   - Medical  - Pharmacy:  Hold ASPIRIN  AND PER REQUEST ELIQUIS  3 DAYS      Type of Anesthesia:  Not Indicated   Additional requests/questions:    Signed, Lucie DELENA Ku   10/04/2023, 5:39 PM

## 2023-10-07 ENCOUNTER — Telehealth: Payer: Self-pay

## 2023-10-07 NOTE — Telephone Encounter (Signed)
   Name: Vanessa Silva  DOB: [birth year]  MRN: [id number]  Primary Cardiologist: Alvan Carrier, MD  Chart reviewed as part of pre-operative protocol coverage. The patient has an upcoming visit scheduled with Dr. Alvan on 10/09/23 at which time clearance can be addressed in case there are any issues that would impact surgical recommendations.  Right L4-L5, L5-S1 TFESI is not scheduled as below. I added preop FYI to appointment note so that provider is aware to address at time of outpatient visit.  Per office protocol the cardiology provider should forward their finalized clearance decision and recommendations regarding antiplatelet therapy to the requesting party below.    This message will also be routed to pharmacy pool for input on holding Eliquis  as requested below so that this information is available to the clearing provider at time of patient's appointment.   I will route this message as FYI to requesting party and remove this message from the preop box as separate preop APP input not needed at this time.   Please call with any questions.  Amenah Tucci D Appollonia Klee, NP  10/07/2023, 8:20 AM

## 2023-10-07 NOTE — Telephone Encounter (Signed)
 Patient having in office appointment on 10/09/23 with Dr. Alvan, pre-op clearance has been added to the appointment. Will remove from the pre-op pool.

## 2023-10-08 DIAGNOSIS — M545 Low back pain, unspecified: Secondary | ICD-10-CM | POA: Diagnosis not present

## 2023-10-08 DIAGNOSIS — M5416 Radiculopathy, lumbar region: Secondary | ICD-10-CM | POA: Diagnosis not present

## 2023-10-08 DIAGNOSIS — M797 Fibromyalgia: Secondary | ICD-10-CM | POA: Diagnosis not present

## 2023-10-08 DIAGNOSIS — F419 Anxiety disorder, unspecified: Secondary | ICD-10-CM | POA: Diagnosis not present

## 2023-10-09 ENCOUNTER — Encounter: Payer: Self-pay | Admitting: Cardiology

## 2023-10-09 ENCOUNTER — Ambulatory Visit: Attending: Cardiology | Admitting: Cardiology

## 2023-10-09 VITALS — BP 120/72 | HR 85 | Ht 68.0 in | Wt 244.0 lb

## 2023-10-09 DIAGNOSIS — I10 Essential (primary) hypertension: Secondary | ICD-10-CM | POA: Diagnosis not present

## 2023-10-09 DIAGNOSIS — E038 Other specified hypothyroidism: Secondary | ICD-10-CM | POA: Diagnosis not present

## 2023-10-09 DIAGNOSIS — E782 Mixed hyperlipidemia: Secondary | ICD-10-CM | POA: Diagnosis not present

## 2023-10-09 DIAGNOSIS — E1165 Type 2 diabetes mellitus with hyperglycemia: Secondary | ICD-10-CM | POA: Diagnosis not present

## 2023-10-09 DIAGNOSIS — Z01818 Encounter for other preprocedural examination: Secondary | ICD-10-CM

## 2023-10-09 DIAGNOSIS — E7800 Pure hypercholesterolemia, unspecified: Secondary | ICD-10-CM | POA: Diagnosis not present

## 2023-10-09 DIAGNOSIS — I5032 Chronic diastolic (congestive) heart failure: Secondary | ICD-10-CM

## 2023-10-09 NOTE — Patient Instructions (Signed)
Medication Instructions:  Your physician recommends that you continue on your current medications as directed. Please refer to the Current Medication list given to you today.   Labwork: None today  Testing/Procedures: None today  Follow-Up: 6 months Dr.Branch  Any Other Special Instructions Will Be Listed Below (If Applicable).  If you need a refill on your cardiac medications before your next appointment, please call your pharmacy.
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
## 2023-10-10 NOTE — Telephone Encounter (Signed)
 I will fax notes from Dr. Alvan. See notes from preop APP Damien Braver, NP.

## 2023-10-10 NOTE — Telephone Encounter (Signed)
 I will forward this to preop APP to review the pt has been cleared.

## 2023-10-10 NOTE — Telephone Encounter (Signed)
 Caller Bobbetta) stated patient's surgery has now been scheduled for Monday, 8/18 and patient will need to hold her medication.  Caller wants a call back on the status of patient's clearance and be reached at [phone]  or [phone]

## 2023-10-15 DIAGNOSIS — M48061 Spinal stenosis, lumbar region without neurogenic claudication: Secondary | ICD-10-CM | POA: Diagnosis not present

## 2023-10-15 DIAGNOSIS — M47816 Spondylosis without myelopathy or radiculopathy, lumbar region: Secondary | ICD-10-CM | POA: Diagnosis not present

## 2023-10-16 DIAGNOSIS — E1140 Type 2 diabetes mellitus with diabetic neuropathy, unspecified: Secondary | ICD-10-CM | POA: Diagnosis not present

## 2023-10-16 DIAGNOSIS — I10 Essential (primary) hypertension: Secondary | ICD-10-CM | POA: Diagnosis not present

## 2023-10-16 DIAGNOSIS — E1165 Type 2 diabetes mellitus with hyperglycemia: Secondary | ICD-10-CM | POA: Diagnosis not present

## 2023-10-16 DIAGNOSIS — E7800 Pure hypercholesterolemia, unspecified: Secondary | ICD-10-CM | POA: Diagnosis not present

## 2023-10-29 DIAGNOSIS — M5416 Radiculopathy, lumbar region: Secondary | ICD-10-CM | POA: Diagnosis not present

## 2023-10-29 DIAGNOSIS — E1142 Type 2 diabetes mellitus with diabetic polyneuropathy: Secondary | ICD-10-CM | POA: Diagnosis not present

## 2023-10-29 DIAGNOSIS — E6609 Other obesity due to excess calories: Secondary | ICD-10-CM | POA: Diagnosis not present

## 2023-10-29 DIAGNOSIS — I10 Essential (primary) hypertension: Secondary | ICD-10-CM | POA: Diagnosis not present

## 2023-10-29 DIAGNOSIS — I5032 Chronic diastolic (congestive) heart failure: Secondary | ICD-10-CM | POA: Diagnosis not present

## 2023-10-29 DIAGNOSIS — Z6836 Body mass index (BMI) 36.0-36.9, adult: Secondary | ICD-10-CM | POA: Diagnosis not present

## 2023-10-29 DIAGNOSIS — M5430 Sciatica, unspecified side: Secondary | ICD-10-CM | POA: Diagnosis not present

## 2023-12-06 DIAGNOSIS — G894 Chronic pain syndrome: Secondary | ICD-10-CM | POA: Diagnosis not present

## 2023-12-06 DIAGNOSIS — M4726 Other spondylosis with radiculopathy, lumbar region: Secondary | ICD-10-CM | POA: Diagnosis not present

## 2023-12-06 DIAGNOSIS — M48061 Spinal stenosis, lumbar region without neurogenic claudication: Secondary | ICD-10-CM | POA: Diagnosis not present

## 2023-12-10 ENCOUNTER — Telehealth: Payer: Self-pay

## 2023-12-10 NOTE — Telephone Encounter (Signed)
   Pre-operative Risk Assessment    Patient Name: Vanessa Silva  DOB: 30-Aug-1960 MRN: [id number]   Date of last office visit: 10/09/23 Date of next office visit: Not scheduled   Request for Surgical Clearance    Procedure:  R L4-5, L5-51 TFESI  Date of Surgery:  Clearance 01/16/24                                Surgeon:Not provided Surgeon's Group or Practice Name:  [facility] [place] Phone number:  [phone] Fax number:  [phone]   Type of Clearance Requested:   - Medical  - Pharmacy:  Hold Apixaban  (Eliquis ) x3 days   Type of Anesthesia:  Not Indicated   Additional requests/questions:    Bonney Ival LOISE Gerome   12/10/2023, 2:34 PM

## 2023-12-13 ENCOUNTER — Telehealth: Payer: Self-pay

## 2023-12-13 NOTE — Telephone Encounter (Signed)
 Patient with diagnosis of atrial fibrillation on Eliquis  for anticoagulation.    Procedure:  R L4-5, L5-51 TFESI   Date of Surgery:  Clearance 01/16/24     CHA2DS2-VASc Score = 7   This indicates a 11.2% annual risk of stroke. The patient's score is based upon: CHF History: 1 HTN History: 1 Diabetes History: 1 Stroke History: 2 Vascular Disease History: 1 Age Score: 0 Gender Score: 1   Chart indicates stroke 10/2015  CrCl > 100 Platelet count 241  Patient has not had an Afib/aflutter ablation in the last 3 months, DCCV within the last 4 weeks or a watchman implanted in the last 45 days   Per office protocol, patient can hold Eliquis  for 3 days prior to procedure.   Patient will not need bridging with Lovenox  (enoxaparin ) around procedure.  **This guidance is not considered finalized until pre-operative APP has relayed final recommendations.**

## 2023-12-13 NOTE — Telephone Encounter (Signed)
 Preop tele appt now scheduled, med rec and consent done.Preop tele appt now scheduled, med rec and consent done.

## 2023-12-13 NOTE — Telephone Encounter (Signed)
  Patient Consent for Virtual Visit        Vanessa Silva has provided verbal consent on 12/13/2023 for a virtual visit (video or telephone).   CONSENT FOR VIRTUAL VISIT FOR:  Vanessa Silva  By participating in this virtual visit I agree to the following:  I hereby voluntarily request, consent and authorize [facility] and its employed or contracted physicians, physician assistants, nurse practitioners or other licensed health care professionals (the Practitioner), to provide me with telemedicine health care services (the "Services) as deemed necessary by the treating Practitioner. I acknowledge and consent to receive the Services by the Practitioner via telemedicine. I understand that the telemedicine visit will involve communicating with the Practitioner through live audiovisual communication technology and the disclosure of certain medical information by electronic transmission. I acknowledge that I have been given the opportunity to request an in-person assessment or other available alternative prior to the telemedicine visit and am voluntarily participating in the telemedicine visit.  I understand that I have the right to withhold or withdraw my consent to the use of telemedicine in the course of my care at any time, without affecting my right to future care or treatment, and that the Practitioner or I may terminate the telemedicine visit at any time. I understand that I have the right to inspect all information obtained and/or recorded in the course of the telemedicine visit and may receive copies of available information for a reasonable fee.  I understand that some of the potential risks of receiving the Services via telemedicine include:  Delay or interruption in medical evaluation due to technological equipment failure or disruption; Information transmitted may not be sufficient (e.g. poor resolution of images) to allow for appropriate medical decision making by the  Practitioner; and/or  In rare instances, security protocols could fail, causing a breach of personal health information.  Furthermore, I acknowledge that it is my responsibility to provide information about my medical history, conditions and care that is complete and accurate to the best of my ability. I acknowledge that Practitioner's advice, recommendations, and/or decision may be based on factors not within their control, such as incomplete or inaccurate data provided by me or distortions of diagnostic images or specimens that may result from electronic transmissions. I understand that the practice of medicine is not an exact science and that Practitioner makes no warranties or guarantees regarding treatment outcomes. I acknowledge that a copy of this consent can be made available to me via my patient portal [facility] MyChart), or I can request a printed copy by calling the office of [facility].    I understand that my insurance will be billed for this visit.   I have read or had this consent read to me. I understand the contents of this consent, which adequately explains the benefits and risks of the Services being provided via telemedicine.  I have been provided ample opportunity to ask questions regarding this consent and the Services and have had my questions answered to my satisfaction. I give my informed consent for the services to be provided through the use of telemedicine in my medical care

## 2023-12-13 NOTE — Telephone Encounter (Signed)
   Name: Vanessa Silva  DOB: 1960/11/17  MRN: [id number]  Primary Cardiologist: Alvan Carrier, MD   Preoperative team, please contact this patient and set up a phone call appointment for further preoperative risk assessment. Please obtain consent and complete medication review. Thank you for your help.  I confirm that guidance regarding antiplatelet and oral anticoagulation therapy has been completed and, if necessary, noted below  Per office protocol, patient can hold Eliquis  for 3 days prior to procedure.  Patient will not need bridging with Lovenox  (enoxaparin ) around procedure.  I also confirmed the patient resides in the state of [place] . As per [facility] Medical Board telemedicine laws, the patient must reside in the state in which the provider is licensed.   Lamarr Satterfield, NP 12/13/2023, 3:45 PM [facility]

## 2024-01-03 DIAGNOSIS — Z7689 Persons encountering health services in other specified circumstances: Secondary | ICD-10-CM | POA: Diagnosis not present

## 2024-01-03 DIAGNOSIS — E782 Mixed hyperlipidemia: Secondary | ICD-10-CM | POA: Diagnosis not present

## 2024-01-03 DIAGNOSIS — E1165 Type 2 diabetes mellitus with hyperglycemia: Secondary | ICD-10-CM | POA: Diagnosis not present

## 2024-01-03 DIAGNOSIS — Z23 Encounter for immunization: Secondary | ICD-10-CM | POA: Diagnosis not present

## 2024-01-03 DIAGNOSIS — G894 Chronic pain syndrome: Secondary | ICD-10-CM | POA: Diagnosis not present

## 2024-01-03 DIAGNOSIS — I10 Essential (primary) hypertension: Secondary | ICD-10-CM | POA: Diagnosis not present

## 2024-01-07 ENCOUNTER — Ambulatory Visit: Attending: Cardiology | Admitting: Emergency Medicine

## 2024-01-07 DIAGNOSIS — Z01810 Encounter for preprocedural cardiovascular examination: Secondary | ICD-10-CM | POA: Diagnosis not present
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
## 2024-01-16 ENCOUNTER — Other Ambulatory Visit: Payer: Self-pay | Admitting: Internal Medicine

## 2024-01-27 DIAGNOSIS — M48061 Spinal stenosis, lumbar region without neurogenic claudication: Secondary | ICD-10-CM | POA: Diagnosis not present

## 2024-01-27 DIAGNOSIS — M4726 Other spondylosis with radiculopathy, lumbar region: Secondary | ICD-10-CM | POA: Diagnosis not present

## 2024-02-02 ENCOUNTER — Other Ambulatory Visit: Payer: Self-pay | Admitting: Physician Assistant

## 2024-02-02 DIAGNOSIS — I4891 Unspecified atrial fibrillation: Secondary | ICD-10-CM

## 2024-02-03 NOTE — Telephone Encounter (Signed)
 Prescription refill request for Eliquis  received. Indication: PAF Last office visit: 10/09/23  JINNY Ross MD Scr:  0.69 on 10/09/23  [facility] Age: 63 Weight: 110.7kg  Based on above findings Eliquis  5mg  twice daily is the appropriate dose.  Refill approved.

## 2024-02-13 ENCOUNTER — Other Ambulatory Visit: Payer: Self-pay | Admitting: Cardiology

## 2024-02-18 DIAGNOSIS — H16001 Unspecified corneal ulcer, right eye: Secondary | ICD-10-CM | POA: Diagnosis not present

## 2024-02-18 DIAGNOSIS — Z961 Presence of intraocular lens: Secondary | ICD-10-CM | POA: Diagnosis not present

## 2024-02-18 DIAGNOSIS — E113553 Type 2 diabetes mellitus with stable proliferative diabetic retinopathy, bilateral: Secondary | ICD-10-CM | POA: Diagnosis not present

## 2024-02-24 DIAGNOSIS — I10 Essential (primary) hypertension: Secondary | ICD-10-CM | POA: Diagnosis not present

## 2024-02-24 DIAGNOSIS — E1140 Type 2 diabetes mellitus with diabetic neuropathy, unspecified: Secondary | ICD-10-CM | POA: Diagnosis not present

## 2024-02-24 DIAGNOSIS — E7800 Pure hypercholesterolemia, unspecified: Secondary | ICD-10-CM | POA: Diagnosis not present

## 2024-02-24 DIAGNOSIS — E1165 Type 2 diabetes mellitus with hyperglycemia: Secondary | ICD-10-CM | POA: Diagnosis not present

## 2024-02-25 DIAGNOSIS — Z961 Presence of intraocular lens: Secondary | ICD-10-CM | POA: Diagnosis not present

## 2024-02-25 DIAGNOSIS — E113553 Type 2 diabetes mellitus with stable proliferative diabetic retinopathy, bilateral: Secondary | ICD-10-CM | POA: Diagnosis not present

## 2024-02-25 DIAGNOSIS — H16001 Unspecified corneal ulcer, right eye: Secondary | ICD-10-CM | POA: Diagnosis not present
